# Patient Record
Sex: Male | Born: 1960 | Race: White | Hispanic: No | Marital: Married | Smoking: Former smoker
Health system: Southern US, Community
[De-identification: ages and names within clinical notes are randomized; demographics above are authoritative.]

## PROBLEM LIST (undated history)

## (undated) DIAGNOSIS — N281 Cyst of kidney, acquired: Secondary | ICD-10-CM

## (undated) DIAGNOSIS — I96 Gangrene, not elsewhere classified: Secondary | ICD-10-CM

## (undated) DIAGNOSIS — I6789 Other cerebrovascular disease: Secondary | ICD-10-CM

## (undated) DIAGNOSIS — I700 Atherosclerosis of aorta: Secondary | ICD-10-CM

## (undated) DIAGNOSIS — E785 Hyperlipidemia, unspecified: Secondary | ICD-10-CM

## (undated) DIAGNOSIS — Z86718 Personal history of other venous thrombosis and embolism: Secondary | ICD-10-CM

## (undated) DIAGNOSIS — R911 Solitary pulmonary nodule: Secondary | ICD-10-CM

## (undated) DIAGNOSIS — M270 Developmental disorders of jaws: Secondary | ICD-10-CM

## (undated) DIAGNOSIS — I10 Essential (primary) hypertension: Secondary | ICD-10-CM

## (undated) DIAGNOSIS — I779 Disorder of arteries and arterioles, unspecified: Secondary | ICD-10-CM

## (undated) DIAGNOSIS — Z7901 Long term (current) use of anticoagulants: Secondary | ICD-10-CM

## (undated) DIAGNOSIS — Z7982 Long term (current) use of aspirin: Secondary | ICD-10-CM

## (undated) DIAGNOSIS — M5030 Other cervical disc degeneration, unspecified cervical region: Secondary | ICD-10-CM

## (undated) DIAGNOSIS — Z9889 Other specified postprocedural states: Secondary | ICD-10-CM

## (undated) DIAGNOSIS — I2510 Atherosclerotic heart disease of native coronary artery without angina pectoris: Secondary | ICD-10-CM

## (undated) DIAGNOSIS — F1011 Alcohol abuse, in remission: Secondary | ICD-10-CM

## (undated) DIAGNOSIS — N179 Acute kidney failure, unspecified: Secondary | ICD-10-CM

## (undated) HISTORY — DX: Atherosclerotic heart disease of native coronary artery without angina pectoris: I25.10

## (undated) HISTORY — DX: Acute kidney failure, unspecified: N17.9

## (undated) HISTORY — DX: Solitary pulmonary nodule: R91.1

## (undated) HISTORY — PX: NECK SURGERY: SHX720

## (undated) HISTORY — PX: CARPAL TUNNEL RELEASE: SHX101

---

## 2001-09-15 HISTORY — PX: NECK SURGERY: SHX720

## 2001-09-15 HISTORY — PX: ANTERIOR CERVICAL DECOMP/DISCECTOMY FUSION: SHX1161

## 2016-09-15 DIAGNOSIS — G459 Transient cerebral ischemic attack, unspecified: Secondary | ICD-10-CM

## 2016-09-15 HISTORY — DX: Transient cerebral ischemic attack, unspecified: G45.9

## 2017-02-17 ENCOUNTER — Emergency Department
Admission: EM | Admit: 2017-02-17 | Discharge: 2017-02-17 | Discharge status: Against Medical Advice | Payer: Federal, State, Local not specified - PPO | Attending: Emergency Medicine | Admitting: Emergency Medicine

## 2017-02-17 DIAGNOSIS — I10 Essential (primary) hypertension: Secondary | ICD-10-CM | POA: Insufficient documentation

## 2017-02-17 DIAGNOSIS — R55 Syncope and collapse: Secondary | ICD-10-CM

## 2017-02-17 DIAGNOSIS — F17200 Nicotine dependence, unspecified, uncomplicated: Secondary | ICD-10-CM | POA: Diagnosis not present

## 2017-02-17 DIAGNOSIS — F10239 Alcohol dependence with withdrawal, unspecified: Secondary | ICD-10-CM | POA: Insufficient documentation

## 2017-02-17 DIAGNOSIS — Z79899 Other long term (current) drug therapy: Secondary | ICD-10-CM | POA: Insufficient documentation

## 2017-02-17 DIAGNOSIS — F10939 Alcohol use, unspecified with withdrawal, unspecified: Secondary | ICD-10-CM

## 2017-02-17 HISTORY — DX: Essential (primary) hypertension: I10

## 2017-02-17 LAB — TROPONIN I: Troponin I: <0.03 ng/mL (ref <0.03)

## 2017-02-17 LAB — BASIC METABOLIC PANEL
Anion gap: 8 (ref 5–15)
BUN: 18 mg/dL (ref 6–20)
CALCIUM: 9.1 mg/dL (ref 8.9–10.3)
CHLORIDE: 101 mmol/L (ref 101–111)
CO2: 27 mmol/L (ref 22–32)
Creatinine, Ser: 1.17 mg/dL (ref 0.61–1.24)
GFR calc non Af Amer: >60 mL/min (ref >60)
Glucose, Bld: 110 mg/dL — ABNORMAL HIGH (ref 65–99)
Potassium: 4.5 mmol/L (ref 3.5–5.1)
Sodium: 136 mmol/L (ref 135–145)

## 2017-02-17 LAB — HEPATIC FUNCTION PANEL
ALK PHOS: 56 U/L (ref 38–126)
ALT: 46 U/L (ref 17–63)
AST: 69 U/L — ABNORMAL HIGH (ref 15–41)
Albumin: 4.3 g/dL (ref 3.5–5.0)
Bilirubin, Direct: <0.1 mg/dL — ABNORMAL LOW (ref 0.1–0.5)
TOTAL PROTEIN: 7.6 g/dL (ref 6.5–8.1)
Total Bilirubin: 1 mg/dL (ref 0.3–1.2)

## 2017-02-17 LAB — CBC
HCT: 37.5 % — ABNORMAL LOW (ref 40.0–52.0)
HEMOGLOBIN: 13.1 g/dL (ref 13.0–18.0)
MCH: 33.7 pg (ref 26.0–34.0)
MCHC: 35 g/dL (ref 32.0–36.0)
MCV: 96.2 fL (ref 80.0–100.0)
Platelets: 227 10*3/uL (ref 150–440)
RBC: 3.9 MIL/uL — ABNORMAL LOW (ref 4.40–5.90)
RDW: 15.2 % — ABNORMAL HIGH (ref 11.5–14.5)
WBC: 10.2 10*3/uL (ref 3.8–10.6)

## 2017-02-17 LAB — ETHANOL: Alcohol, Ethyl (B): <5 mg/dL (ref <5)

## 2017-02-17 MED ORDER — VITAMIN B-1 100 MG PO TABS
100.0000 mg | ORAL_TABLET | Freq: Every day | ORAL | Status: DC
  Administered 2017-02-17: 100 mg via ORAL
  Filled 2017-02-17: qty 1

## 2017-02-17 MED ORDER — LORAZEPAM 2 MG/ML IJ SOLN
1.0000 mg | Freq: Once | INTRAMUSCULAR | Status: AC
  Administered 2017-02-17: 1 mg via INTRAVENOUS
  Filled 2017-02-17: qty 1

## 2017-02-17 MED ORDER — THIAMINE HCL 100 MG/ML IJ SOLN
100.0000 mg | Freq: Every day | INTRAMUSCULAR | Status: DC
  Filled 2017-02-17: qty 2

## 2017-02-17 MED ORDER — SODIUM CHLORIDE 0.9 % IV BOLUS (SEPSIS)
1000.0000 mL | Freq: Once | INTRAVENOUS | Status: AC
  Administered 2017-02-17: 1000 mL via INTRAVENOUS

## 2017-02-17 NOTE — ED Notes (Signed)
This RN removed 20 G IV from RIGHT AC. Pt tolerated well. Catheter removed intact, site clean and dry. Dressing applied.

## 2017-02-17 NOTE — ED Provider Notes (Signed)
[name] Emergency Department Provider Note  Time seen: 4:41 PM  I have reviewed the triage vital signs and the nursing notes.   HISTORY  Chief Complaint Near Syncope    HPI Brad Chen is a 56 y.o. male with a past medical history of hypertension who presents to the emergency department after a near syncopal episode. According to the patient around 12:30 today he felt very dizzy/lightheaded and his legs, very weak and wobbly. Patient states he fell he keeps going to pass out but did not. He continued to be very weak and unable to walk due to weakness/wobbliness in his legs. Patient came to the emergency department for evaluation. Denies any chest pain at any point. Patient states this happened approximately 3 weeks ago as well. Patient continues to be extremely ataxic/wobbly, with tremor in the emergency department.  Past Medical History:  Diagnosis Date  . Hypertension     There are no active problems to display for this patient.   History reviewed. No pertinent surgical history.  Prior to Admission medications   Not on File    No Known Allergies  No family history on file.  Social History Social History  Substance Use Topics  . Smoking status: Current Every Day Smoker  . Smokeless tobacco: Not on file  . Alcohol use Yes    Review of Systems Constitutional: Negative for fever. Cardiovascular: Negative for chest pain. Respiratory: Negative for shortness of breath. Gastrointestinal: Negative for abdominal pain Genitourinary: Negative for dysuria. Musculoskeletal: Negative for back pain. Skin: Negative for rash. Neurological: Negative for headache. Weakness of both of his legs. All other ROS negative  ____________________________________________   PHYSICAL EXAM:  VITAL SIGNS: ED Triage Vitals [02/17/17 1418]  Enc Vitals Group     BP (!) 175/73     Pulse Rate 82     Resp 20     Temp 98.2 F (36.8 C)     Temp Source Oral     SpO2 100 %     Weight 154 lb (69.9 kg)     Height 5\' 9"  (1.753 m)     Head Circumference      Peak Flow      Pain Score      Pain Loc      Pain Edu?      Excl. in GC?     Constitutional: Alert and oriented. Patient is very tremulous. Eyes: Normal exam ENT   Head: Normocephalic and atraumatic.   Mouth/Throat: Mucous membranes are moist. Cardiovascular: Normal rate, regular rhythm. No murmur Respiratory: Normal respiratory effort without tachypnea nor retractions. Breath sounds are clear  Gastrointestinal: Soft and nontender. No distention.   Musculoskeletal: Nontender with normal range of motion in all extremities. Neurologic:  Normal speech and language. Very tremulous, positive for asterixis, no pronator drift. Patient has 5/5 motor in all extremities but with ataxic movements. Sensation is intact and equal. No cranial nerve deficits. Normal patellar reflexes. Skin:  Skin is warm, dry and intact.  Psychiatric: Mood and affect are normal.  ____________________________________________    EKG  EKG reviewed and interpreted by myself shows normal sinus rhythm at 72 bpm, narrow QRS, normal axis, normal intervals, no concerning ST changes.  ____________________________________________   INITIAL IMPRESSION / ASSESSMENT AND PLAN / ED COURSE  Pertinent labs & imaging results that were available during my care of the patient were reviewed by me and considered in my medical decision making (see chart for details).  The patient presents  to the emergency department for a near syncopal event at work. Patient states he is very wobbly is having trouble walking. Even in the emergency department cannot walk from the wheelchair to the bed without holding onto something. Patient has significant tremor in all extremities, asterixis on exam. Patient admits to daily drinking, but states he drank last night like normal denies any drinking during the daytime. Since approximately 6 beers per  day. Patient's examination is very consistent with withdrawal. We will check labs, IV hydrate, treat with IV Ativan and thiamine. We will reassess after Ativan administration. We will check a CIWA score.    Patient remains extremely tremulousness. I definitely believe there is a significant alcohol withdrawal issue. I have highly recommended the patient be admitted to the hospital for further workup and detox. I stated to the patient if it was not withdrawal related there were still need to be a neurological workup including neurology consultation and possible MRI. Patient understands the risks but still wishes to leave the hospital. Patient is having to hold onto the wheelchair to ambulate, I discussed with the patient and wife at length now on multiple occasions my high recommendation to stay in the emergency department. The patient has capacity to make some decisions, understands risks of going home but wishes to be discharged anyways. We will discharge the patient home AGAINST MEDICAL ADVICE.  ____________________________________________   FINAL CLINICAL IMPRESSION(S) / ED DIAGNOSES  near syncope Alcohol withdrawal   Minna Antis, MD 02/17/17 [phone]

## 2017-02-17 NOTE — ED Triage Notes (Signed)
Pt arrives to ER via [facility] from work after near syncope at work. VSS with [facility]. CBG 208. Pt did not pass out. Denies SOB or CP. Pt alert and oriented X4, active, cooperative, pt in NAD. RR even and unlabored, color WNL.  500cc NS bolus by EMS, 20 G to L forearm by EMS.

## 2017-02-23 ENCOUNTER — Other Ambulatory Visit: Payer: Self-pay | Admitting: Internal Medicine

## 2017-02-23 DIAGNOSIS — R29898 Other symptoms and signs involving the musculoskeletal system: Secondary | ICD-10-CM

## 2017-02-23 DIAGNOSIS — R270 Ataxia, unspecified: Secondary | ICD-10-CM

## 2017-02-24 ENCOUNTER — Ambulatory Visit
Admission: RE | Admit: 2017-02-24 | Discharge: 2017-02-24 | Disposition: A | Discharge status: Home / Self Care | Payer: Federal, State, Local not specified - PPO | Source: Ambulatory Visit | Attending: Internal Medicine | Admitting: Internal Medicine

## 2017-02-24 DIAGNOSIS — R9082 White matter disease, unspecified: Secondary | ICD-10-CM | POA: Diagnosis not present

## 2017-02-24 DIAGNOSIS — R270 Ataxia, unspecified: Secondary | ICD-10-CM | POA: Insufficient documentation

## 2017-02-24 DIAGNOSIS — G319 Degenerative disease of nervous system, unspecified: Secondary | ICD-10-CM | POA: Diagnosis not present

## 2017-02-24 DIAGNOSIS — R29898 Other symptoms and signs involving the musculoskeletal system: Secondary | ICD-10-CM

## 2017-02-24 DIAGNOSIS — R531 Weakness: Secondary | ICD-10-CM | POA: Diagnosis not present

## 2017-02-24 DIAGNOSIS — R600 Localized edema: Secondary | ICD-10-CM | POA: Diagnosis not present

## 2018-05-05 ENCOUNTER — Emergency Department
Admission: EM | Admit: 2018-05-05 | Discharge: 2018-05-05 | Disposition: A | Discharge status: Home / Self Care | Payer: Federal, State, Local not specified - PPO | Attending: Emergency Medicine | Admitting: Emergency Medicine

## 2018-05-05 ENCOUNTER — Emergency Department: Payer: Federal, State, Local not specified - PPO

## 2018-05-05 ENCOUNTER — Other Ambulatory Visit: Payer: Self-pay

## 2018-05-05 DIAGNOSIS — I10 Essential (primary) hypertension: Secondary | ICD-10-CM | POA: Diagnosis not present

## 2018-05-05 DIAGNOSIS — F10920 Alcohol use, unspecified with intoxication, uncomplicated: Secondary | ICD-10-CM

## 2018-05-05 DIAGNOSIS — F10129 Alcohol abuse with intoxication, unspecified: Secondary | ICD-10-CM | POA: Insufficient documentation

## 2018-05-05 DIAGNOSIS — W19XXXA Unspecified fall, initial encounter: Secondary | ICD-10-CM | POA: Diagnosis not present

## 2018-05-05 DIAGNOSIS — R4182 Altered mental status, unspecified: Secondary | ICD-10-CM | POA: Diagnosis present

## 2018-05-05 DIAGNOSIS — F17210 Nicotine dependence, cigarettes, uncomplicated: Secondary | ICD-10-CM | POA: Insufficient documentation

## 2018-05-05 LAB — CBC WITH DIFFERENTIAL/PLATELET
BASOS PCT: 1 %
Basophils Absolute: 0.1 10*3/uL (ref 0–0.1)
EOS ABS: 0 10*3/uL (ref 0–0.7)
EOS PCT: 1 %
HCT: 33.1 % — ABNORMAL LOW (ref 40.0–52.0)
Hemoglobin: 11.6 g/dL — ABNORMAL LOW (ref 13.0–18.0)
Lymphocytes Relative: 22 %
Lymphs Abs: 2.1 10*3/uL (ref 1.0–3.6)
MCH: 34.9 pg — ABNORMAL HIGH (ref 26.0–34.0)
MCHC: 34.9 g/dL (ref 32.0–36.0)
MCV: 99.9 fL (ref 80.0–100.0)
Monocytes Absolute: 0.8 10*3/uL (ref 0.2–1.0)
Monocytes Relative: 8 %
NEUTROS ABS: 6.4 10*3/uL (ref 1.4–6.5)
Neutrophils Relative %: 68 %
PLATELETS: 323 10*3/uL (ref 150–440)
RBC: 3.31 MIL/uL — AB (ref 4.40–5.90)
RDW: 13.4 % (ref 11.5–14.5)
WBC: 9.3 10*3/uL (ref 3.8–10.6)

## 2018-05-05 LAB — ETHANOL: ALCOHOL ETHYL (B): 315 mg/dL — AB (ref <10)

## 2018-05-05 LAB — URINALYSIS, COMPLETE (UACMP) WITH MICROSCOPIC
Bacteria, UA: NONE SEEN
Bilirubin Urine: NEGATIVE
GLUCOSE, UA: 50 mg/dL — AB
Ketones, ur: NEGATIVE mg/dL
Leukocytes, UA: NEGATIVE
NITRITE: NEGATIVE
Protein, ur: NEGATIVE mg/dL
SPECIFIC GRAVITY, URINE: 1.005 (ref 1.005–1.030)
pH: 5 (ref 5.0–8.0)

## 2018-05-05 LAB — COMPREHENSIVE METABOLIC PANEL
ALBUMIN: 3.9 g/dL (ref 3.5–5.0)
ALT: 44 U/L (ref 0–44)
ANION GAP: 10 (ref 5–15)
AST: 64 U/L — ABNORMAL HIGH (ref 15–41)
Alkaline Phosphatase: 56 U/L (ref 38–126)
BUN: 12 mg/dL (ref 6–20)
CHLORIDE: 102 mmol/L (ref 98–111)
CO2: 24 mmol/L (ref 22–32)
Calcium: 8.5 mg/dL — ABNORMAL LOW (ref 8.9–10.3)
Creatinine, Ser: 1.07 mg/dL (ref 0.61–1.24)
GFR calc Af Amer: >60 mL/min (ref >60)
GFR calc non Af Amer: >60 mL/min (ref >60)
GLUCOSE: 95 mg/dL (ref 70–99)
Potassium: 4.6 mmol/L (ref 3.5–5.1)
SODIUM: 136 mmol/L (ref 135–145)
Total Bilirubin: 0.5 mg/dL (ref 0.3–1.2)
Total Protein: 6.9 g/dL (ref 6.5–8.1)

## 2018-05-05 LAB — URINE DRUG SCREEN, QUALITATIVE (ARMC ONLY)
AMPHETAMINES, UR SCREEN: NOT DETECTED
Barbiturates, Ur Screen: NOT DETECTED
Cannabinoid 50 Ng, Ur ~~LOC~~: NOT DETECTED
Cocaine Metabolite,Ur ~~LOC~~: NOT DETECTED
MDMA (ECSTASY) UR SCREEN: NOT DETECTED
Methadone Scn, Ur: NOT DETECTED
Opiate, Ur Screen: NOT DETECTED
PHENCYCLIDINE (PCP) UR S: NOT DETECTED
Tricyclic, Ur Screen: NOT DETECTED

## 2018-05-05 LAB — TROPONIN I: Troponin I: <0.03 ng/mL (ref <0.03)

## 2018-05-05 NOTE — ED Notes (Addendum)
Pt was discharged to lobby in order to call cab in order to get home. First nurse notified. Pt was able to walk to lobby with steady gait and no distress noted. Pt was adamant about leaving and getting discharged and therefore this nurse was unable to obtain vitals prior to patient leaving.

## 2018-05-05 NOTE — ED Notes (Signed)
Nile RiggsStephanie Mitschke - spouse - [phone] - call when released

## 2018-05-05 NOTE — ED Notes (Signed)
Dr Mayford KnifeWilliams notified of critical ETOH level of 315 - no new orders at this time

## 2018-05-05 NOTE — ED Notes (Signed)
[employer] patrol at bed side.  

## 2018-05-05 NOTE — ED Notes (Addendum)
Pt states he is leaving now and that he needs to call his wife - the wife has called this nurse and states that the pt cannot come home - advised wife that we could not hold pt against his will - wife requested to talk to pt - pt given phone to contact wife - pt is up for discharge

## 2018-05-05 NOTE — ED Notes (Addendum)
[employer] blood draw (alcohol) for [employer]

## 2018-05-05 NOTE — ED Notes (Signed)
Patient transported to CT 

## 2018-05-05 NOTE — ED Notes (Signed)
Pt agitated and standing at nurses desk - he is demanding for someone to call his wife and let him go home - pt has pulled IV out of hand and it is hanging by the tape - pt states he is not staying in the bed and he is not staying here one more second - Dr Mayford KnifeWilliams notified and is talking to pt

## 2018-05-05 NOTE — ED Notes (Signed)
Pt wife states no one is picking pt up. Pt told that he can call a cab from the lobby when he is discharged. Pt stated his wallet is at home. This tech told pt he could let them know that. Pt informed that cab phone number and phone is in the lobby. Pt is waiting on DC papers at this time.
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
## 2018-05-05 NOTE — ED Notes (Signed)
Pt resting quietly with eyes closed respirations even and unlabored
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
## 2018-05-05 NOTE — ED Notes (Signed)
Pt refuses to be connected to monitoring equipment and becomes more agitated the more conversation is initiated about it

## 2018-07-21 DIAGNOSIS — Z86718 Personal history of other venous thrombosis and embolism: Secondary | ICD-10-CM | POA: Insufficient documentation

## 2020-02-19 ENCOUNTER — Other Ambulatory Visit: Payer: Self-pay

## 2020-02-19 ENCOUNTER — Observation Stay
Admission: EM | Admit: 2020-02-19 | Discharge: 2020-02-20 | Disposition: A | Discharge status: Home / Self Care | Payer: Federal, State, Local not specified - PPO | Attending: Internal Medicine | Admitting: Internal Medicine

## 2020-02-19 ENCOUNTER — Emergency Department: Payer: Federal, State, Local not specified - PPO

## 2020-02-19 ENCOUNTER — Encounter: Payer: Self-pay | Admitting: Internal Medicine

## 2020-02-19 ENCOUNTER — Observation Stay: Payer: Federal, State, Local not specified - PPO

## 2020-02-19 DIAGNOSIS — I10 Essential (primary) hypertension: Secondary | ICD-10-CM | POA: Insufficient documentation

## 2020-02-19 DIAGNOSIS — Z720 Tobacco use: Secondary | ICD-10-CM | POA: Diagnosis present

## 2020-02-19 DIAGNOSIS — N179 Acute kidney failure, unspecified: Secondary | ICD-10-CM | POA: Diagnosis not present

## 2020-02-19 DIAGNOSIS — E860 Dehydration: Secondary | ICD-10-CM | POA: Diagnosis not present

## 2020-02-19 DIAGNOSIS — R7989 Other specified abnormal findings of blood chemistry: Secondary | ICD-10-CM | POA: Diagnosis present

## 2020-02-19 DIAGNOSIS — R55 Syncope and collapse: Secondary | ICD-10-CM | POA: Diagnosis not present

## 2020-02-19 DIAGNOSIS — F17200 Nicotine dependence, unspecified, uncomplicated: Secondary | ICD-10-CM | POA: Diagnosis not present

## 2020-02-19 DIAGNOSIS — Z20822 Contact with and (suspected) exposure to covid-19: Secondary | ICD-10-CM | POA: Diagnosis not present

## 2020-02-19 DIAGNOSIS — R4189 Other symptoms and signs involving cognitive functions and awareness: Secondary | ICD-10-CM | POA: Diagnosis not present

## 2020-02-19 DIAGNOSIS — F1010 Alcohol abuse, uncomplicated: Secondary | ICD-10-CM | POA: Diagnosis not present

## 2020-02-19 DIAGNOSIS — Z79899 Other long term (current) drug therapy: Secondary | ICD-10-CM | POA: Insufficient documentation

## 2020-02-19 LAB — BASIC METABOLIC PANEL
Anion gap: 11 (ref 5–15)
BUN: 13 mg/dL (ref 6–20)
CO2: 22 mmol/L (ref 22–32)
Calcium: 7.4 mg/dL — ABNORMAL LOW (ref 8.9–10.3)
Chloride: 104 mmol/L (ref 98–111)
Creatinine, Ser: 1.39 mg/dL — ABNORMAL HIGH (ref 0.61–1.24)
GFR calc Af Amer: >60 mL/min (ref >60)
GFR calc non Af Amer: 55 mL/min — ABNORMAL LOW (ref >60)
Glucose, Bld: 91 mg/dL (ref 70–99)
Potassium: 4 mmol/L (ref 3.5–5.1)
Sodium: 137 mmol/L (ref 135–145)

## 2020-02-19 LAB — HEPATIC FUNCTION PANEL
ALT: 20 U/L (ref 0–44)
AST: 28 U/L (ref 15–41)
Albumin: 3.3 g/dL — ABNORMAL LOW (ref 3.5–5.0)
Alkaline Phosphatase: 39 U/L (ref 38–126)
Bilirubin, Direct: <0.1 mg/dL (ref 0.0–0.2)
Total Bilirubin: 0.7 mg/dL (ref 0.3–1.2)
Total Protein: 6 g/dL — ABNORMAL LOW (ref 6.5–8.1)

## 2020-02-19 LAB — URINALYSIS, COMPLETE (UACMP) WITH MICROSCOPIC
Bacteria, UA: NONE SEEN
Bilirubin Urine: NEGATIVE
Glucose, UA: 50 mg/dL — AB
Hgb urine dipstick: NEGATIVE
Ketones, ur: 5 mg/dL — AB
Leukocytes,Ua: NEGATIVE
Nitrite: NEGATIVE
Protein, ur: NEGATIVE mg/dL
Specific Gravity, Urine: 1.006 (ref 1.005–1.030)
pH: 6 (ref 5.0–8.0)

## 2020-02-19 LAB — URINE DRUG SCREEN, QUALITATIVE (ARMC ONLY)
Amphetamines, Ur Screen: NOT DETECTED
Barbiturates, Ur Screen: NOT DETECTED
Benzodiazepine, Ur Scrn: NOT DETECTED
Cannabinoid 50 Ng, Ur ~~LOC~~: NOT DETECTED
Cocaine Metabolite,Ur ~~LOC~~: NOT DETECTED
MDMA (Ecstasy)Ur Screen: NOT DETECTED
Methadone Scn, Ur: NOT DETECTED
Opiate, Ur Screen: NOT DETECTED
Phencyclidine (PCP) Ur S: NOT DETECTED
Tricyclic, Ur Screen: NOT DETECTED

## 2020-02-19 LAB — CBC WITH DIFFERENTIAL/PLATELET
Abs Immature Granulocytes: 0.05 10*3/uL (ref 0.00–0.07)
Basophils Absolute: 0.1 10*3/uL (ref 0.0–0.1)
Basophils Relative: 1 %
Eosinophils Absolute: 0 10*3/uL (ref 0.0–0.5)
Eosinophils Relative: 0 %
HCT: 32.3 % — ABNORMAL LOW (ref 39.0–52.0)
Hemoglobin: 11.3 g/dL — ABNORMAL LOW (ref 13.0–17.0)
Immature Granulocytes: 1 %
Lymphocytes Relative: 19 %
Lymphs Abs: 1.3 10*3/uL (ref 0.7–4.0)
MCH: 34 pg (ref 26.0–34.0)
MCHC: 35 g/dL (ref 30.0–36.0)
MCV: 97.3 fL (ref 80.0–100.0)
Monocytes Absolute: 0.7 10*3/uL (ref 0.1–1.0)
Monocytes Relative: 10 %
Neutro Abs: 4.6 10*3/uL (ref 1.7–7.7)
Neutrophils Relative %: 69 %
Platelets: 313 10*3/uL (ref 150–400)
RBC: 3.32 MIL/uL — ABNORMAL LOW (ref 4.22–5.81)
RDW: 13.2 % (ref 11.5–15.5)
WBC: 6.7 10*3/uL (ref 4.0–10.5)
nRBC: 0 % (ref 0.0–0.2)

## 2020-02-19 LAB — SARS CORONAVIRUS 2 BY RT PCR (HOSPITAL ORDER, PERFORMED IN ~~LOC~~ HOSPITAL LAB): SARS Coronavirus 2: NEGATIVE

## 2020-02-19 LAB — PROTIME-INR
INR: 0.9 (ref 0.8–1.2)
Prothrombin Time: 11.6 seconds (ref 11.4–15.2)

## 2020-02-19 LAB — MAGNESIUM: Magnesium: 1.8 mg/dL (ref 1.7–2.4)

## 2020-02-19 LAB — LACTIC ACID, PLASMA
Lactic Acid, Venous: 2.9 mmol/L (ref 0.5–1.9)
Lactic Acid, Venous: 2.9 mmol/L (ref 0.5–1.9)

## 2020-02-19 LAB — ETHANOL: Alcohol, Ethyl (B): 140 mg/dL — ABNORMAL HIGH (ref <10)

## 2020-02-19 LAB — TROPONIN I (HIGH SENSITIVITY)
Troponin I (High Sensitivity): 8 ng/L (ref <18)
Troponin I (High Sensitivity): 8 ng/L (ref <18)

## 2020-02-19 LAB — PHOSPHORUS: Phosphorus: 2.6 mg/dL (ref 2.5–4.6)

## 2020-02-19 MED ORDER — LORAZEPAM 2 MG/ML IJ SOLN: 0.0000 mg | Freq: Four times a day (QID) | INTRAMUSCULAR | Status: DC

## 2020-02-19 MED ORDER — HYDRALAZINE HCL 20 MG/ML IJ SOLN
5.0000 mg | INTRAMUSCULAR | Status: DC | PRN
  Filled 2020-02-19: qty 0.25

## 2020-02-19 MED ORDER — CALCIUM GLUCONATE-NACL 2-0.675 GM/100ML-% IV SOLN
2.0000 g | Freq: Once | INTRAVENOUS | Status: AC
  Administered 2020-02-19: 2000 mg via INTRAVENOUS
  Filled 2020-02-19: qty 100

## 2020-02-19 MED ORDER — SODIUM CHLORIDE 0.9 % IV BOLUS
1000.0000 mL | Freq: Once | INTRAVENOUS | Status: AC
  Administered 2020-02-19: 1000 mL via INTRAVENOUS

## 2020-02-19 MED ORDER — SODIUM CHLORIDE 0.9 % IV SOLN: INTRAVENOUS | Status: DC

## 2020-02-19 MED ORDER — LORAZEPAM 2 MG PO TABS: 0.0000 mg | ORAL_TABLET | Freq: Two times a day (BID) | ORAL | Status: DC

## 2020-02-19 MED ORDER — ENOXAPARIN SODIUM 40 MG/0.4ML ~~LOC~~ SOLN
40.0000 mg | SUBCUTANEOUS | Status: DC
  Administered 2020-02-19: 40 mg via SUBCUTANEOUS
  Filled 2020-02-19: qty 0.4

## 2020-02-19 MED ORDER — THIAMINE HCL 100 MG/ML IJ SOLN: 100.0000 mg | Freq: Every day | INTRAMUSCULAR | Status: DC

## 2020-02-19 MED ORDER — ENOXAPARIN SODIUM 40 MG/0.4ML ~~LOC~~ SOLN: 40.0000 mg | SUBCUTANEOUS | Status: DC

## 2020-02-19 MED ORDER — NICOTINE 21 MG/24HR TD PT24
21.0000 mg | MEDICATED_PATCH | Freq: Every day | TRANSDERMAL | Status: DC
  Administered 2020-02-19 – 2020-02-20 (×2): 21 mg via TRANSDERMAL
  Filled 2020-02-19 (×2): qty 1

## 2020-02-19 MED ORDER — LORAZEPAM 2 MG/ML IJ SOLN: 0.0000 mg | Freq: Two times a day (BID) | INTRAMUSCULAR | Status: DC

## 2020-02-19 MED ORDER — THIAMINE HCL 100 MG PO TABS
100.0000 mg | ORAL_TABLET | Freq: Every day | ORAL | Status: DC
  Administered 2020-02-19 – 2020-02-20 (×2): 100 mg via ORAL
  Filled 2020-02-19 (×2): qty 1

## 2020-02-19 MED ORDER — LORAZEPAM 2 MG PO TABS: 0.0000 mg | ORAL_TABLET | Freq: Four times a day (QID) | ORAL | Status: DC

## 2020-02-19 MED ORDER — ONDANSETRON HCL 4 MG/2ML IJ SOLN: 4.0000 mg | Freq: Three times a day (TID) | INTRAMUSCULAR | Status: DC | PRN

## 2020-02-19 MED ORDER — SODIUM CHLORIDE 0.9% FLUSH
3.0000 mL | Freq: Two times a day (BID) | INTRAVENOUS | Status: DC
  Administered 2020-02-19 – 2020-02-20 (×2): 3 mL via INTRAVENOUS

## 2020-02-19 MED ORDER — LORAZEPAM 2 MG/ML IJ SOLN: 2.0000 mg | INTRAMUSCULAR | Status: DC | PRN

## 2020-02-19 MED ORDER — ACETAMINOPHEN 325 MG PO TABS: 650.0000 mg | ORAL_TABLET | Freq: Four times a day (QID) | ORAL | Status: DC | PRN
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
## 2020-02-19 NOTE — H&P (Signed)
History and Physical    Brad Chen QDI:[id number] DOB: 14-May-1961 DOA: 02/19/2020  Referring MD/NP/PA:   PCP: Lauro Regulus, MD   Patient coming from:  The patient is coming from home.  At baseline, pt is independent for most of ADL.        Chief Complaint: Unresponsiveness  HPI: Brad Chen is a 59 y.o. male with medical history significant of tobacco abuse, alcohol abuse, hypertension, who presents with unresponsiveness.  Per his wife, patient suddenly became unresponsive when he was in the car as a passenger.  He probably was not responsive for about 1 to 2 minutes.  No seizure activity noted.  Patient had incontinence of urine and bowel movement during the episode.  When patient woke up, he is mildly confused, but oriented x3.  He moves all extremities normally.  No facial droop or slurred speech.  No vision change or hearing loss.  Patient denies any chest pain, shortness breath, cough.  No fever or chills.  Denies nausea, vomiting, diarrhea, abdominal pain, symptoms of UTI.  ED Course: pt was found to have WBC 6.7, lactic acid 2.9, INR 0.9, pending COVID-19 PCR, alcohol level 140, AKI with creatinine 1.39, BUN 13, blood pressure 132/61, heart rate 83, RR 18, oxygen saturation 97% on room air.  CT head is negative for acute intracranial abnormalities.  Calcium level 7.4 which is corrected to 7.96.  Patient is placed on MedSurg bed for observation  Review of Systems:   General: no fevers, chills, no body weight gain, has fatigue HEENT: no blurry vision, hearing changes or sore throat Respiratory: no dyspnea, coughing, wheezing CV: no chest pain, no palpitations GI: no nausea, vomiting, abdominal pain, diarrhea, constipation GU: no dysuria, burning on urination, increased urinary frequency, hematuria  Ext: no leg edema Neuro: no unilateral weakness, numbness, or tingling, no vision change or hearing loss. Had unresponsiveness Skin: no skin tear. MSK: No muscle spasm,  no deformity, no limitation of range of movement in spin Heme: No easy bruising.  Travel history: No recent long distant travel.  Allergy: No Known Allergies  Past Medical History:  Diagnosis Date  . Hypertension     Past Surgical History:  Procedure Laterality Date  . CARPAL TUNNEL RELEASE    . NECK SURGERY      Social History:  reports that he has been smoking. He has never used smokeless tobacco. He reports current alcohol use. He reports that he does not use drugs.  Family History:  Family History  Problem Relation Age of Onset  . Hypertension Mother      Prior to Admission medications   Medication Sig Start Date End Date Taking? Authorizing Provider  lisinopril (PRINIVIL,ZESTRIL) 10 MG tablet Take 10 mg by mouth daily. 02/25/18   [provider]    Physical Exam: Vitals:   02/19/20 1319 02/19/20 1320 02/19/20 1524  BP: 132/61  128/69  Pulse: 83    Resp: 15    SpO2: 97%    Weight:  75.8 kg    General: Not in acute distress HEENT:       Eyes: PERRL, EOMI, no scleral icterus.       ENT: No discharge from the ears and nose, no pharynx injection, no tonsillar enlargement.        Neck: No JVD, no bruit, no mass felt. Heme: No neck lymph node enlargement. Cardiac: S1/S2, RRR, No murmurs, No gallops or rubs. Respiratory: No rales, wheezing, rhonchi or rubs. GI: Soft, nondistended, nontender,  no rebound pain, no organomegaly, BS present. GU: No hematuria Ext: No pitting leg edema bilaterally. 2+DP/PT pulse bilaterally. Musculoskeletal: No joint deformities, No joint redness or warmth, no limitation of ROM in spin. Skin: No skin tear Neuro: currently pt is alert, oriented X3, cranial nerves II-XII grossly intact, moves all extremities normally. Muscle strength 5/5 in all extremities, sensation to light touch intact.  Psych: Patient is not psychotic, no suicidal or hemocidal ideation.  Labs on Admission: I have personally reviewed following labs and imaging  studies  CBC: Recent Labs  Lab 02/19/20 1329  WBC 6.7  NEUTROABS 4.6  HGB 11.3*  HCT 32.3*  MCV 97.3  PLT 313   Basic Metabolic Panel: Recent Labs  Lab 02/19/20 1329  NA 137  K 4.0  CL 104  CO2 22  GLUCOSE 91  BUN 13  CREATININE 1.39*  CALCIUM 7.4*   GFR: CrCl cannot be calculated (Unknown ideal weight.). Liver Function Tests: Recent Labs  Lab 02/19/20 1329  AST 28  ALT 20  ALKPHOS 39  BILITOT 0.7  PROT 6.0*  ALBUMIN 3.3*   No results for input(s): LIPASE, AMYLASE in the last 168 hours. No results for input(s): AMMONIA in the last 168 hours. Coagulation Profile: Recent Labs  Lab 02/19/20 1329  INR 0.9   Cardiac Enzymes: No results for input(s): CKTOTAL, CKMB, CKMBINDEX, TROPONINI in the last 168 hours. BNP (last 3 results) No results for input(s): PROBNP in the last 8760 hours. HbA1C: No results for input(s): HGBA1C in the last 72 hours. CBG: No results for input(s): GLUCAP in the last 168 hours. Lipid Profile: No results for input(s): CHOL, HDL, LDLCALC, TRIG, CHOLHDL, LDLDIRECT in the last 72 hours. Thyroid Function Tests: No results for input(s): TSH, T4TOTAL, FREET4, T3FREE, THYROIDAB in the last 72 hours. Anemia Panel: No results for input(s): VITAMINB12, FOLATE, FERRITIN, TIBC, IRON, RETICCTPCT in the last 72 hours. Urine analysis:    Component Value Date/Time   COLORURINE YELLOW (A) 02/19/2020 1510   APPEARANCEUR CLEAR (A) 02/19/2020 1510   LABSPEC 1.006 02/19/2020 1510   PHURINE 6.0 02/19/2020 1510   GLUCOSEU 50 (A) 02/19/2020 1510   HGBUR NEGATIVE 02/19/2020 1510   BILIRUBINUR NEGATIVE 02/19/2020 1510   KETONESUR 5 (A) 02/19/2020 1510   PROTEINUR NEGATIVE 02/19/2020 1510   NITRITE NEGATIVE 02/19/2020 1510   LEUKOCYTESUR NEGATIVE 02/19/2020 1510   Sepsis Labs: @LABRCNTIP (procalcitonin:4,lacticidven:4) ) Recent Results (from the past 240 hour(s))  SARS Coronavirus 2 by RT PCR (hospital order, performed in [facility] hospital lab)  Nasopharyngeal Nasopharyngeal Swab     Status: None   Collection Time: 02/19/20  3:21 PM   Specimen: Nasopharyngeal Swab  Result Value Ref Range Status   SARS Coronavirus 2 NEGATIVE NEGATIVE Final    Comment: (NOTE) SARS-CoV-2 target nucleic acids are NOT DETECTED. The SARS-CoV-2 RNA is generally detectable in upper and lower respiratory specimens during the acute phase of infection. The lowest concentration of SARS-CoV-2 viral copies this assay can detect is 250 copies / mL. A negative result does not preclude SARS-CoV-2 infection and should not be used as the sole basis for treatment or other patient management decisions.  A negative result may occur with improper specimen collection / handling, submission of specimen other than nasopharyngeal swab, presence of viral mutation(s) within the areas targeted by this assay, and inadequate number of viral copies (<250 copies / mL). A negative result must be combined with clinical observations, patient history, and epidemiological information. Fact Sheet for Patients:   04/20/20  Fact Sheet for Healthcare Providers: BankingDealers.co.za This test is not yet approved or cleared  by the [place] FDA and has been authorized for detection and/or diagnosis of SARS-CoV-2 by FDA under an Emergency Use Authorization (EUA).  This EUA will remain in effect (meaning this test can be used) for the duration of the COVID-19 declaration under Section 564(b)(1) of the Act, 21 U.S.C. section 360bbb-3(b)(1), unless the authorization is terminated or revoked sooner. Performed at [facility], [address] [place], [place] [address]      Radiological Exams on Admission: CT Head Wo Contrast  Result Date: 02/19/2020 CLINICAL DATA:  Seizure, unresponsive, hypotensive EXAM: CT HEAD WITHOUT CONTRAST TECHNIQUE: Contiguous axial images were obtained from the base of the skull through the  vertex without intravenous contrast. COMPARISON:  05/05/2018 and previous FINDINGS: Brain: Mild parenchymal atrophy. Patchy mild areas of hypoattenuation in deep and periventricular white matter bilaterally. Negative for acute intracranial hemorrhage, mass lesion, acute infarction, midline shift, or mass-effect. Acute infarct may be inapparent on noncontrast CT. Ventricles and sulci symmetric. Vascular: Atherosclerotic and physiologic intracranial calcifications. Skull: Normal. Negative for fracture or focal lesion. Sinuses/Orbits: No acute finding. Other: None IMPRESSION: 1. Negative for bleed or other acute intracranial process. 2. Mild atrophy and nonspecific white matter changes. Electronically Signed   By: Lucrezia Europe M.D.   On: 02/19/2020 14:02     EKG: Independently reviewed.  Sinus rhythm, QTC 469, LAE, nonspecific T wave changes  Assessment/Plan Principal Problem:   Unresponsive Active Problems:   Hypertension   Elevated lactic acid level   AKI (acute kidney injury) ([place])   Alcohol abuse   Hypocalcemia   Tobacco abuse   Unresponsive: Etiology is not clear. The differential diagnosis is broad, including alcohol intoxication, seizure, TIA, arrhythmia, drug abuse, orthostatic status.  - Place on med-surg for obs - Orthostatic vital signs  -  MRI-brain - EEG - 2d echo - Neuro checks  - IVF: 2L of NS 75 cc/h  HTN:  -hold lisinopril due to AKI -hydralazine prn  Elevated lactic acid level: Lactic acid 2.9, likely due to alcohol abuse and starvation.  No signs of infection -IV fluid as above  AKI (acute kidney injury) ([place]): Likely due to dehydration and continuation of lisinopril -Hold lisinopril -IV fluid as above -Follow-up renal function by BMP  Hypocalcemia: -Give 2 g of calcium gluconate -Check magnesium and phosph level  Tobacco abuse and Alcohol abuse: -Did counseling about importance of quitting smoking -Nicotine patch -Did counseling about the importance of  quitting drinking -CIWA protocol      DVT ppx: SQ Lovenox Code Status: Full code Family Communication: Yes, patient's wife at bed side Disposition Plan:  Anticipate discharge back to previous environment Consults called:  None Admission status: Med-surg bed for obs     Status is: Observation  The patient remains OBS appropriate and will d/c before 2 midnights.  Dispo: The patient is from: Home              Anticipated d/c is to: Home              Anticipated d/c date is: 1 day              Patient currently is not medically stable to d/c.         Date of Service 02/19/2020    Ivor Costa [facility]   If 7PM-7AM, please contact night-coverage [email or web address] 02/19/2020, 6:14 PM

## 2020-02-19 NOTE — ED Notes (Signed)
Called to give report, RN to look over chart and call if questions arise. Will transport to floor at apx 2000.

## 2020-02-19 NOTE — ED Notes (Signed)
Spoke to [employer], alerted will transport to floor at 2000 and to call if questions arise. RN agreeable.

## 2020-02-19 NOTE — Progress Notes (Signed)
   02/19/20 2104  Vitals  Temp 98 F (36.7 C)  BP (!) 157/88  MAP (mmHg) 107  BP Location Right Arm  BP Method Automatic  Patient Position (if appropriate) Standing  Pulse Rate 85  Resp 19  Oxygen Therapy  SpO2 97 %  O2 Device Room Air  MEWS Score  MEWS Temp 0  MEWS Systolic 0  MEWS Pulse 0  MEWS RR 0  MEWS LOC 0  MEWS Score 0  MEWS Score Color Green  The patient is admitted to 1 A 36 with the diagnosis of unresponsiveness.  A & O x 4. Denied any acute pain. The patient is oriented to his room,ascom/call bell and staff. Patient voiced no concern. Will continue to monitor.

## 2020-02-19 NOTE — ED Triage Notes (Signed)
Pt presents via EMS c/o unresponsiveness and hypotension. EMS reports pt drinks ETOH daily however pt states drink ETOH q3-4 days. Pt A&Ox4 at this time.

## 2020-02-20 ENCOUNTER — Observation Stay
Admit: 2020-02-20 | Discharge: 2020-02-20 | Disposition: A | Discharge status: Home / Self Care | Payer: Federal, State, Local not specified - PPO | Attending: Internal Medicine | Admitting: Internal Medicine

## 2020-02-20 DIAGNOSIS — R4189 Other symptoms and signs involving cognitive functions and awareness: Secondary | ICD-10-CM

## 2020-02-20 LAB — PROTIME-INR
INR: 0.9 (ref 0.8–1.2)
Prothrombin Time: 11.8 seconds (ref 11.4–15.2)

## 2020-02-20 LAB — BASIC METABOLIC PANEL
Anion gap: 10 (ref 5–15)
BUN: 12 mg/dL (ref 6–20)
CO2: 26 mmol/L (ref 22–32)
Calcium: 8.3 mg/dL — ABNORMAL LOW (ref 8.9–10.3)
Chloride: 104 mmol/L (ref 98–111)
Creatinine, Ser: 1.13 mg/dL (ref 0.61–1.24)
GFR calc Af Amer: >60 mL/min (ref >60)
GFR calc non Af Amer: >60 mL/min (ref >60)
Glucose, Bld: 86 mg/dL (ref 70–99)
Potassium: 3.8 mmol/L (ref 3.5–5.1)
Sodium: 140 mmol/L (ref 135–145)

## 2020-02-20 LAB — ECHOCARDIOGRAM COMPLETE
Height: 69 in
Weight: 2497.6 oz

## 2020-02-20 LAB — GLUCOSE, CAPILLARY
Glucose-Capillary: 83 mg/dL (ref 70–99)
Glucose-Capillary: 94 mg/dL (ref 70–99)
Glucose-Capillary: 95 mg/dL (ref 70–99)

## 2020-02-20 LAB — HIV ANTIBODY (ROUTINE TESTING W REFLEX): HIV Screen 4th Generation wRfx: NONREACTIVE

## 2020-02-20 LAB — LACTIC ACID, PLASMA
Lactic Acid, Venous: 1.4 mmol/L (ref 0.5–1.9)
Lactic Acid, Venous: 1.4 mmol/L (ref 0.5–1.9)

## 2020-02-20 LAB — MAGNESIUM: Magnesium: 1.8 mg/dL (ref 1.7–2.4)

## 2020-02-20 MED ORDER — THIAMINE HCL 100 MG PO TABS: 100.0000 mg | ORAL_TABLET | Freq: Every day | ORAL | 1 refills | Status: DC

## 2020-02-20 MED ORDER — LISINOPRIL 10 MG PO TABS
10.0000 mg | ORAL_TABLET | Freq: Every day | ORAL | Status: DC
  Administered 2020-02-20: 10 mg via ORAL
  Filled 2020-02-20: qty 1

## 2020-02-20 NOTE — Discharge Summary (Signed)
Physician Discharge Summary  Brad Chen ZWC:[id number] DOB: Jul 16, 1961 DOA: 02/19/2020  PCP: Lauro Regulus, MD  Admit date: 02/19/2020 Discharge date: 02/20/2020  Admitted From: Home Disposition: Home  Recommendations for Outpatient Follow-up:  1. Follow up with PCP in 1-2 weeks 2. Please obtain BMP/CBC in one week 3. Please follow up on the following pending results: Echocardiogram and EEG results  Home Health: No  Equipment/Devices: None Discharge Condition: Stable CODE STATUS: Full Diet recommendation: Heart Healthy   Brief/Interim Summary: Brad Chen is a 59 y.o. male with medical history significant of tobacco abuse, alcohol abuse, hypertension, who presents with unresponsiveness.  Per his wife, patient suddenly became unresponsive when he was in the car as a passenger.  He probably was not responsive for about 1 to 2 minutes.  No seizure activity noted.  Per patient he was doing some heavy activity around the house before going in the car.  He was also feeling little dizzy and weak, really thinks that he was dehydrated so he drank some water and went in the car to go for groceries with his wife.  He was feeling little lightheaded and had a short syncopal episode.  No history of prior similar episodes.  Denies any chest pain or shortness of breath.  Denies any prior history of seizures.  Patient drinks regularly but denies any history of alcohol withdrawal symptoms or seizures.  On arrival he was back to his baseline.  In ED he was having unremarkable work-up except mildly elevated lactic acid and creatinine which resolved with IV fluid.  His alcohol levels were 140 with negative UDS.  We had an extensive discussion regarding quitting alcohol.  We held his lisinopril which she can resumed on discharge. EKG with sinus rhythm and no ST changes.  Troponin remain negative.  Echocardiogram and EEG was done in order to complete syncope work-up, pending results.  His initial  orthostatic vitals were positive. His labs and symptoms were more consistent with dehydration.  He will follow-up with his PCP for further management.  Discharge Diagnoses:  Principal Problem:   Unresponsive Active Problems:   Hypertension   Elevated lactic acid level   AKI (acute kidney injury) (HCC)   Alcohol abuse   Hypocalcemia   Tobacco abuse  Discharge Instructions  Discharge Instructions    Diet - low sodium heart healthy   Complete by: As directed    Discharge instructions   Complete by: As directed    It was pleasure taking care of you. This keep yourself well-hydrated as it seems you were dehydrated. All of your labs are okay but do point out that you are getting some damage due to your alcohol intake.  Try to quit.  Your PCP should be able to help if needed. Follow-up with your PCP for further management.   Increase activity slowly   Complete by: As directed      Allergies as of 02/20/2020   No Known Allergies     Medication List    TAKE these medications   lisinopril 10 MG tablet Commonly known as: ZESTRIL Take 10 mg by mouth daily.   thiamine 100 MG tablet Take 1 tablet (100 mg total) by mouth daily. Start taking on: February 21, 2020      Follow-up Information    Lauro Regulus, MD. Schedule an appointment as soon as possible for a visit.   Specialty: Internal Medicine Contact information: [address] [facility] [place] [address]  [phone]          No Known Allergies  Consultations:  None  Procedures/Studies: CT Head Wo Contrast  Result Date: 02/19/2020 CLINICAL DATA:  Seizure, unresponsive, hypotensive EXAM: CT HEAD WITHOUT CONTRAST TECHNIQUE: Contiguous axial images were obtained from the base of the skull through the vertex without intravenous contrast. COMPARISON:  05/05/2018 and previous FINDINGS: Brain: Mild parenchymal atrophy. Patchy mild areas of hypoattenuation in deep and periventricular white  matter bilaterally. Negative for acute intracranial hemorrhage, mass lesion, acute infarction, midline shift, or mass-effect. Acute infarct may be inapparent on noncontrast CT. Ventricles and sulci symmetric. Vascular: Atherosclerotic and physiologic intracranial calcifications. Skull: Normal. Negative for fracture or focal lesion. Sinuses/Orbits: No acute finding. Other: None IMPRESSION: 1. Negative for bleed or other acute intracranial process. 2. Mild atrophy and nonspecific white matter changes. Electronically Signed   By: Corlis Leak  Hassell M.D.   On: 02/19/2020 14:02   MR BRAIN WO CONTRAST  Result Date: 02/20/2020 CLINICAL DATA:  Initial evaluation for acute syncope/presyncope. Cardiac cause suspected. EXAM: MRI HEAD WITHOUT CONTRAST TECHNIQUE: Multiplanar, multiecho pulse sequences of the brain and surrounding structures were obtained without intravenous contrast. COMPARISON:  Prior head CT from 02/19/2020. FINDINGS: Brain: Diffuse prominence of the CSF containing spaces compatible generalized age-related cerebral atrophy. Mild scattered patchy T2/FLAIR hyperintensity within the periventricular and deep white matter both cerebral hemispheres most consistent with chronic small vessel ischemic disease. No abnormal foci of restricted diffusion to suggest acute or subacute ischemia. Gray-white matter differentiation maintained. No encephalomalacia to suggest chronic cortical infarction. No foci of susceptibility artifact to suggest acute or chronic intracranial hemorrhage. No mass lesion, midline shift or mass effect. No hydrocephalus or extra-axial fluid collection. Pituitary gland suprasellar region within normal limits. Midline structures intact. Vascular: Major intracranial vascular flow voids are maintained. Skull and upper cervical spine: Craniocervical junction within normal limits. Bone marrow signal intensity normal. No scalp soft tissue abnormality. Sinuses/Orbits: Globes and orbital soft tissues within  normal limits. Mild scattered mucosal thickening noted throughout the paranasal sinuses. No air-fluid level to suggest acute sinusitis. Mastoid air cells are clear. Inner ear structures grossly normal. Other: None. IMPRESSION: 1. No acute intracranial abnormality. 2. Mild age-related cerebral atrophy with chronic small vessel ischemic disease. Electronically Signed   By: Rise MuBenjamin  McClintock M.D.   On: 02/20/2020 02:11   EEG adult  Result Date: 02/20/2020 Thana Farreynolds, Leslie, MD     02/20/2020  3:29 PM ELECTROENCEPHALOGRAM REPORT Patient: Brad Chen       Room #: 136A-AA EEG No. ID: [id number] Age: 59 y.o.        Sex: male Requesting Physician: Nelson ChimesAmin Report Date:  02/20/2020       Interpreting Physician: Thana FarrEYNOLDS, LESLIE History: Brad Chen is an 59 y.o. male with an episode of unresponsiveness Medications: Thiamine, Ativan Conditions of Recording:  This is a 21 channel routine scalp EEG performed with bipolar and monopolar montages arranged in accordance to the international 10/20 system of electrode placement. One channel was dedicated to EKG recording. The patient is in the awake and drowsy states. Description:  The waking background activity consists of a low voltage, symmetrical, fairly well organized, 9-10 Hz alpha activity, seen from the parieto-occipital and posterior temporal regions.  Low voltage fast activity, poorly organized, is seen anteriorly and is at times superimposed on more posterior regions.  A mixture of theta and alpha rhythms are seen from the central and temporal regions. The patient drowses with slowing to irregular, low voltage theta and  beta activity.  Stage II sleep is not obtained. No epileptiform activity is noted.  Hyperventilation was not performed.  Intermittent photic stimulation was performed but failed to illicit any change in the tracing. IMPRESSION: Normal electroencephalogram, awake, drowsy and with activation procedures. There are no focal lateralizing or epileptiform  features. Alexis Goodell, MD Neurology [phone] 02/20/2020, 3:26 PM     Subjective: He was feeling better when seen today.  Denies any complaints.  Stating that he feels back to his baseline and really think that he was dehydrated yesterday as he was doing some heavy work around the house.  Discharge Exam: Vitals:   02/20/20 0528 02/20/20 0731  BP: 135/81 (!) 179/84  Pulse: (!) 109 65  Resp: 18 16  Temp: 98.9 F (37.2 C) 98.9 F (37.2 C)  SpO2: 100% 100%   Vitals:   02/20/20 0525 02/20/20 0526 02/20/20 0528 02/20/20 0731  BP: (!) 180/87 (!) 183/83 135/81 (!) 179/84  Pulse: 68 62 (!) 109 65  Resp: 17 17 18 16   Temp: 98.5 F (36.9 C) 98.9 F (37.2 C) 98.9 F (37.2 C) 98.9 F (37.2 C)  TempSrc:      SpO2: 98% 99% 100% 100%  Weight:      Height:        General: Pt is alert, awake, not in acute distress Cardiovascular: RRR, S1/S2 +, no rubs, no gallops Respiratory: CTA bilaterally, no wheezing, no rhonchi Abdominal: Soft, NT, ND, bowel sounds + Extremities: no edema, no cyanosis   The results of significant diagnostics from this hospitalization (including imaging, microbiology, ancillary and laboratory) are listed below for reference.    Microbiology: Recent Results (from the past 240 hour(s))  SARS Coronavirus 2 by RT PCR (hospital order, performed in [facility] hospital lab) Nasopharyngeal Nasopharyngeal Swab     Status: None   Collection Time: 02/19/20  3:21 PM   Specimen: Nasopharyngeal Swab  Result Value Ref Range Status   SARS Coronavirus 2 NEGATIVE NEGATIVE Final    Comment: (NOTE) SARS-CoV-2 target nucleic acids are NOT DETECTED. The SARS-CoV-2 RNA is generally detectable in upper and lower respiratory specimens during the acute phase of infection. The lowest concentration of SARS-CoV-2 viral copies this assay can detect is 250 copies / mL. A negative result does not preclude SARS-CoV-2 infection and should not be used as the sole basis for treatment or  other patient management decisions.  A negative result may occur with improper specimen collection / handling, submission of specimen other than nasopharyngeal swab, presence of viral mutation(s) within the areas targeted by this assay, and inadequate number of viral copies (<250 copies / mL). A negative result must be combined with clinical observations, patient history, and epidemiological information. Fact Sheet for Patients:   StrictlyIdeas.no Fact Sheet for Healthcare Providers: BankingDealers.co.za This test is not yet approved or cleared  by the [place] FDA and has been authorized for detection and/or diagnosis of SARS-CoV-2 by FDA under an Emergency Use Authorization (EUA).  This EUA will remain in effect (meaning this test can be used) for the duration of the COVID-19 declaration under Section 564(b)(1) of the Act, 21 U.S.C. section 360bbb-3(b)(1), unless the authorization is terminated or revoked sooner. Performed at [facility], [address] [place], [place] [address]      Labs: BNP (last 3 results) No results for input(s): BNP in the last 8760 hours. Basic Metabolic Panel: Recent Labs  Lab 02/19/20 1329 02/19/20 1521 02/20/20 0452  NA 137  --  140  K 4.0  --  3.8  CL 104  --  104  CO2 22  --  26  GLUCOSE 91  --  86  BUN 13  --  12  CREATININE 1.39*  --  1.13  CALCIUM 7.4*  --  8.3*  MG  --  1.8 1.8  PHOS  --  2.6  --    Liver Function Tests: Recent Labs  Lab 02/19/20 1329  AST 28  ALT 20  ALKPHOS 39  BILITOT 0.7  PROT 6.0*  ALBUMIN 3.3*   No results for input(s): LIPASE, AMYLASE in the last 168 hours. No results for input(s): AMMONIA in the last 168 hours. CBC: Recent Labs  Lab 02/19/20 1329  WBC 6.7  NEUTROABS 4.6  HGB 11.3*  HCT 32.3*  MCV 97.3  PLT 313   Cardiac Enzymes: No results for input(s): CKTOTAL, CKMB, CKMBINDEX, TROPONINI in the last 168 hours. BNP: Invalid  input(s): POCBNP CBG: Recent Labs  Lab 02/20/20 0733 02/20/20 1154  GLUCAP 83 94   D-Dimer No results for input(s): DDIMER in the last 72 hours. Hgb A1c No results for input(s): HGBA1C in the last 72 hours. Lipid Profile No results for input(s): CHOL, HDL, LDLCALC, TRIG, CHOLHDL, LDLDIRECT in the last 72 hours. Thyroid function studies No results for input(s): TSH, T4TOTAL, T3FREE, THYROIDAB in the last 72 hours.  Invalid input(s): FREET3 Anemia work up No results for input(s): VITAMINB12, FOLATE, FERRITIN, TIBC, IRON, RETICCTPCT in the last 72 hours. Urinalysis    Component Value Date/Time   COLORURINE YELLOW (A) 02/19/2020 1510   APPEARANCEUR CLEAR (A) 02/19/2020 1510   LABSPEC 1.006 02/19/2020 1510   PHURINE 6.0 02/19/2020 1510   GLUCOSEU 50 (A) 02/19/2020 1510   HGBUR NEGATIVE 02/19/2020 1510   BILIRUBINUR NEGATIVE 02/19/2020 1510   KETONESUR 5 (A) 02/19/2020 1510   PROTEINUR NEGATIVE 02/19/2020 1510   NITRITE NEGATIVE 02/19/2020 1510   LEUKOCYTESUR NEGATIVE 02/19/2020 1510   Sepsis Labs Invalid input(s): PROCALCITONIN,  WBC,  LACTICIDVEN Microbiology Recent Results (from the past 240 hour(s))  SARS Coronavirus 2 by RT PCR (hospital order, performed in [facility] hospital lab) Nasopharyngeal Nasopharyngeal Swab     Status: None   Collection Time: 02/19/20  3:21 PM   Specimen: Nasopharyngeal Swab  Result Value Ref Range Status   SARS Coronavirus 2 NEGATIVE NEGATIVE Final    Comment: (NOTE) SARS-CoV-2 target nucleic acids are NOT DETECTED. The SARS-CoV-2 RNA is generally detectable in upper and lower respiratory specimens during the acute phase of infection. The lowest concentration of SARS-CoV-2 viral copies this assay can detect is 250 copies / mL. A negative result does not preclude SARS-CoV-2 infection and should not be used as the sole basis for treatment or other patient management decisions.  A negative result may occur with improper specimen collection /  handling, submission of specimen other than nasopharyngeal swab, presence of viral mutation(s) within the areas targeted by this assay, and inadequate number of viral copies (<250 copies / mL). A negative result must be combined with clinical observations, patient history, and epidemiological information. Fact Sheet for Patients:   BoilerBrush.com.cy Fact Sheet for Healthcare Providers: https://pope.com/ This test is not yet approved or cleared  by the [place] FDA and has been authorized for detection and/or diagnosis of SARS-CoV-2 by FDA under an Emergency Use Authorization (EUA).  This EUA will remain in effect (meaning this test can be used) for the duration of the COVID-19 declaration under Section 564(b)(1) of the Act, 21  U.S.C. section 360bbb-3(b)(1), unless the authorization is terminated or revoked sooner. Performed at [facility], [address] [place], [place] [address]     Time coordinating discharge: Over 30 minutes  SIGNED:  Arnetha Courser, MD  [facility] 02/20/2020, 3:29 PM  If 7PM-7AM, please contact night-coverage [email or web address]  This record has been created using Conservation officer, historic buildings. Errors have been sought and corrected,but may not always be located. Such creation errors do not reflect on the standard of care.

## 2020-02-20 NOTE — Progress Notes (Signed)
PT Cancellation Note  Patient Details Name: Brad Chen MRN: [id number] DOB: September 12, 1961   Cancelled Treatment:    Reason Eval/Treat Not Completed: PT screened, no needs identified, will sign off. Patient states he walked in the room with OT this morning and did fine, reports he has no needs. Feeling fine. PT will sign off at this time.     , 02/20/2020, 3:21 PM

## 2020-02-20 NOTE — Progress Notes (Signed)
Discharge summary reviewed with verbal understanding. Escorted to personal vehicle via wc 

## 2020-02-20 NOTE — Evaluation (Signed)
Occupational Therapy Evaluation Patient Details Name: Brad Chen MRN: [id number] DOB: 10-May-1961 Today's Date: 02/20/2020    History of Present Illness Pt is 59 y/o M with PMH: tobacco abuse, alcohol abuse, and hypertension, who presented with unresponsiveness (spouse reports x1-2 mins, was awake at time of arrival to ED). Adm with elevated lactic acid level, AKI and HTN.   Clinical Impression   Pt seen for OT evaluation this date. Pt at baseline is indep with all ADLs/IADLs and fxl mobility. Pt lives with spouse in [facility] with 1 STE. This date, demos indep with all assessed self care and fxl mobility with no AD. Overall, anticipate pt is safe to return home with spouse supv as needed. No OT f/u need anticipated.     Follow Up Recommendations  No OT follow up    Equipment Recommendations  None recommended by OT    Recommendations for Other Services       Precautions / Restrictions Precautions Precautions: Fall Restrictions Weight Bearing Restrictions: No      Mobility Bed Mobility Overal bed mobility: Independent                Transfers Overall transfer level: Independent                    Balance Overall balance assessment: No apparent balance deficits (not formally assessed)                                         ADL either performed or assessed with clinical judgement   ADL Overall ADL's : Independent;At baseline                                       General ADL Comments: fxl mobility to bathroom with no AD with supv only for IV mgt. toileting with Indep-standing to void, clothing mgt I'ly in standing. LB ADLs seated with Indep, Indep for grooming standing sink-side.     Vision Baseline Vision/History: Wears glasses Wears Glasses: Reading only Patient Visual Report: No change from baseline       Perception     Praxis      Pertinent Vitals/Pain Pain Assessment: No/denies pain     Hand Dominance      Extremity/Trunk Assessment Upper Extremity Assessment Upper Extremity Assessment: Overall WFL for tasks assessed(UE strength grossly 4+/5 to 5/5. ROM WNL)   Lower Extremity Assessment Lower Extremity Assessment: Overall WFL for tasks assessed       Communication Communication Communication: No difficulties   Cognition Arousal/Alertness: Awake/alert Behavior During Therapy: WFL for tasks assessed/performed Overall Cognitive Status: Within Functional Limits for tasks assessed                                 General Comments: slight restlessness/shakiness noted on assessment, but pt is A&O and appropriate with command following t/o   General Comments  some shakiness/tremors present, but do not appear to impede balance    Exercises Other Exercises Other Exercises: OT facilitates education re: Role of OT in acute setting, safety with ADLs/ADL mobility-monitoring for dizziness with position change. Pt with good understanding.   Shoulder Instructions      Home Living Family/patient expects to be discharged to:: Private residence Living Arrangements:  Spouse/significant other Available Help at Discharge: Family;Available 24 hours/day Type of Home: House Home Access: Stairs to enter [employer] of Steps: 1 Entrance Stairs-Rails: Right Home Layout: One level     Bathroom Shower/Tub: Tub/shower unit;Walk-in shower         Home Equipment: None   Additional Comments: reports that spouse has canes that were collectibles from an uncle who has passed, but pt has no AD and used no AD.      Prior Functioning/Environment Level of Independence: Independent        Comments: reprots driving, doing all I/ADLs I'ly        OT Problem List: Decreased activity tolerance      OT Treatment/Interventions:      OT Goals(Current goals can be found in the care plan section) Acute Rehab OT Goals Patient Stated Goal: to go home OT Goal Formulation: All  assessment and education complete, DC therapy  OT Frequency:     Barriers to D/C:            Co-evaluation              AM-PAC OT "6 Clicks" Daily Activity     Outcome Measure Help from another person eating meals?: None Help from another person taking care of personal grooming?: None Help from another person toileting, which includes using toliet, bedpan, or urinal?: None Help from another person bathing (including washing, rinsing, drying)?: None Help from another person to put on and taking off regular upper body clothing?: None Help from another person to put on and taking off regular lower body clothing?: None 6 Click Score: 24   End of Session Nurse Communication: Other (comment)(notified CM of pt performance)  Activity Tolerance: Patient tolerated treatment well Patient left: in bed;with call bell/phone within reach;with bed alarm set  OT Visit Diagnosis: Other abnormalities of gait and mobility (R26.89)                Time: 0347-4259 OT Time Calculation (min): 13 min Charges:  OT General Charges $OT Visit: 1 Visit OT Evaluation $OT Eval Low Complexity: 1 Low  Rejeana Brock, MS, OTR/L ascom [phone] 02/20/20, 9:51 AM

## 2020-02-20 NOTE — Progress Notes (Signed)
PT Cancellation Note  Patient Details Name: Brad Chen MRN: [id number] DOB: 14-Jun-1961   Cancelled Treatment:    Reason Eval/Treat Not Completed: Other (comment). Consult received and chart reviewed. Pt currently out of room and unavailable for therapy. Will re-attempt next available date.   , 02/20/2020, 1:30 PM Elizabeth Palau, PT, DPT [phone]

## 2020-02-20 NOTE — Progress Notes (Signed)
eeg completed ° °

## 2020-02-20 NOTE — Procedures (Signed)
ELECTROENCEPHALOGRAM REPORT   Patient: Brad Chen       Room #: 136A-AA EEG No. ID: [id number] Age: 59 y.o.        Sex: male Requesting Physician: Nelson Chimes Report Date:  02/20/2020        Interpreting Physician: Thana Farr  History: Brad Chen is an 59 y.o. male with an episode of unresponsiveness  Medications:  Thiamine, Ativan  Conditions of Recording:  This is a 21 channel routine scalp EEG performed with bipolar and monopolar montages arranged in accordance to the international 10/20 system of electrode placement. One channel was dedicated to EKG recording.  The patient is in the awake and drowsy states.  Description:  The waking background activity consists of a low voltage, symmetrical, fairly well organized, 9-10 Hz alpha activity, seen from the parieto-occipital and posterior temporal regions.  Low voltage fast activity, poorly organized, is seen anteriorly and is at times superimposed on more posterior regions.  A mixture of theta and alpha rhythms are seen from the central and temporal regions. The patient drowses with slowing to irregular, low voltage theta and beta activity.   Stage II sleep is not obtained. No epileptiform activity is noted.   Hyperventilation was not performed.  Intermittent photic stimulation was performed but failed to illicit any change in the tracing.    IMPRESSION: Normal electroencephalogram, awake, drowsy and with activation procedures. There are no focal lateralizing or epileptiform features.   Thana Farr, MD Neurology [phone] 02/20/2020, 3:26 PM

## 2020-02-20 NOTE — Progress Notes (Signed)
*  PRELIMINARY RESULTS* Echocardiogram 2D Echocardiogram has been performed.  Brad Chen 02/20/2020, 11:58 AM

## 2021-01-26 ENCOUNTER — Emergency Department
Admission: EM | Admit: 2021-01-26 | Discharge: 2021-01-27 | Disposition: A | Discharge status: Home / Self Care | Payer: Federal, State, Local not specified - PPO | Attending: Emergency Medicine | Admitting: Emergency Medicine

## 2021-01-26 DIAGNOSIS — I10 Essential (primary) hypertension: Secondary | ICD-10-CM | POA: Insufficient documentation

## 2021-01-26 DIAGNOSIS — Y900 Blood alcohol level of less than 20 mg/100 ml: Secondary | ICD-10-CM | POA: Insufficient documentation

## 2021-01-26 DIAGNOSIS — F1010 Alcohol abuse, uncomplicated: Secondary | ICD-10-CM | POA: Insufficient documentation

## 2021-01-26 DIAGNOSIS — F17200 Nicotine dependence, unspecified, uncomplicated: Secondary | ICD-10-CM | POA: Insufficient documentation

## 2021-01-26 DIAGNOSIS — Z79899 Other long term (current) drug therapy: Secondary | ICD-10-CM | POA: Insufficient documentation

## 2021-01-26 DIAGNOSIS — R200 Anesthesia of skin: Secondary | ICD-10-CM | POA: Insufficient documentation

## 2021-01-26 LAB — URINE DRUG SCREEN, QUALITATIVE (ARMC ONLY)
Amphetamines, Ur Screen: NOT DETECTED
Barbiturates, Ur Screen: NOT DETECTED
Benzodiazepine, Ur Scrn: NOT DETECTED
Cannabinoid 50 Ng, Ur ~~LOC~~: POSITIVE — AB
Cocaine Metabolite,Ur ~~LOC~~: NOT DETECTED
MDMA (Ecstasy)Ur Screen: NOT DETECTED
Methadone Scn, Ur: NOT DETECTED
Opiate, Ur Screen: NOT DETECTED
Phencyclidine (PCP) Ur S: NOT DETECTED
Tricyclic, Ur Screen: NOT DETECTED

## 2021-01-26 LAB — COMPREHENSIVE METABOLIC PANEL
ALT: 104 U/L — ABNORMAL HIGH (ref 0–44)
AST: 156 U/L — ABNORMAL HIGH (ref 15–41)
Albumin: 4 g/dL (ref 3.5–5.0)
Alkaline Phosphatase: 56 U/L (ref 38–126)
Anion gap: 14 (ref 5–15)
BUN: 16 mg/dL (ref 6–20)
CO2: 25 mmol/L (ref 22–32)
Calcium: 9.2 mg/dL (ref 8.9–10.3)
Chloride: 93 mmol/L — ABNORMAL LOW (ref 98–111)
Creatinine, Ser: 1.07 mg/dL (ref 0.61–1.24)
GFR, Estimated: >60 mL/min (ref >60)
Glucose, Bld: 132 mg/dL — ABNORMAL HIGH (ref 70–99)
Potassium: 3.5 mmol/L (ref 3.5–5.1)
Sodium: 132 mmol/L — ABNORMAL LOW (ref 135–145)
Total Bilirubin: 2 mg/dL — ABNORMAL HIGH (ref 0.3–1.2)
Total Protein: 7.1 g/dL (ref 6.5–8.1)

## 2021-01-26 LAB — CBC
HCT: 38 % — ABNORMAL LOW (ref 39.0–52.0)
Hemoglobin: 13.7 g/dL (ref 13.0–17.0)
MCH: 33.2 pg (ref 26.0–34.0)
MCHC: 36.1 g/dL — ABNORMAL HIGH (ref 30.0–36.0)
MCV: 92 fL (ref 80.0–100.0)
Platelets: 143 10*3/uL — ABNORMAL LOW (ref 150–400)
RBC: 4.13 MIL/uL — ABNORMAL LOW (ref 4.22–5.81)
RDW: 12.7 % (ref 11.5–15.5)
WBC: 10.8 10*3/uL — ABNORMAL HIGH (ref 4.0–10.5)
nRBC: 0 % (ref 0.0–0.2)

## 2021-01-26 LAB — ETHANOL: Alcohol, Ethyl (B): <10 mg/dL (ref <10)

## 2021-01-26 MED ORDER — NICOTINE 21 MG/24HR TD PT24
21.0000 mg | MEDICATED_PATCH | Freq: Once | TRANSDERMAL | Status: DC
  Administered 2021-01-26: 21 mg via TRANSDERMAL
  Filled 2021-01-26: qty 1

## 2021-01-26 MED ORDER — LORAZEPAM 2 MG PO TABS: 0.0000 mg | ORAL_TABLET | Freq: Two times a day (BID) | ORAL | Status: DC

## 2021-01-26 MED ORDER — LORAZEPAM 2 MG/ML IJ SOLN
0.0000 mg | Freq: Four times a day (QID) | INTRAMUSCULAR | Status: DC
  Administered 2021-01-26: 2 mg via INTRAVENOUS
  Filled 2021-01-26: qty 1

## 2021-01-26 MED ORDER — THIAMINE HCL 100 MG/ML IJ SOLN: 100.0000 mg | Freq: Every day | INTRAMUSCULAR | Status: DC

## 2021-01-26 MED ORDER — THIAMINE HCL 100 MG/ML IJ SOLN
Freq: Once | INTRAVENOUS | Status: AC
  Filled 2021-01-26: qty 1000

## 2021-01-26 MED ORDER — LORAZEPAM 2 MG/ML IJ SOLN: 0.0000 mg | Freq: Two times a day (BID) | INTRAMUSCULAR | Status: DC

## 2021-01-26 MED ORDER — THIAMINE HCL 100 MG PO TABS: 100.0000 mg | ORAL_TABLET | Freq: Every day | ORAL | Status: DC

## 2021-01-26 MED ORDER — LORAZEPAM 2 MG PO TABS
0.0000 mg | ORAL_TABLET | Freq: Four times a day (QID) | ORAL | Status: DC
  Administered 2021-01-27: 1 mg via ORAL
  Filled 2021-01-26: qty 1

## 2021-01-26 NOTE — ED Notes (Signed)
Pt resting in bed, medicated per [facility]

## 2021-01-26 NOTE — ED Notes (Signed)
Blood drawn and sent to lab.

## 2021-01-26 NOTE — ED Triage Notes (Signed)
Pt arrives with EMS for alcohol withdrawal.  Pt does not want to go to rehab per EMS.

## 2021-01-26 NOTE — Discharge Instructions (Addendum)
You may take Lorazepam every 8 hours as needed for shaking.  Return to the ER for worsening symptoms, persistent vomiting, difficulty breathing or other concerns

## 2021-01-26 NOTE — ED Notes (Signed)
Pt requesting nicotine patch, MD Derrill Kay made aware via secure chat.

## 2021-01-26 NOTE — ED Provider Notes (Signed)
[facility] Emergency Department Provider Note  ________________________________________   I have reviewed the triage vital signs and the nursing notes.   HISTORY  Chief Complaint Alcohol Problem   History limited by: Not Limited   HPI Brad Chen is a 60 y.o. male who presents to the emergency department today because of concern for alcohol withdrawal. Patient states that his wife was out of town over the past 10 days. He states that during this time he was drinking a prolific amount of beers. He last drank yesterday. Today he has felt sick. Has felt like his legs are numb. Feels anxious. Felt like he was going to day. He says that he has had issues with foot numbness in the past but it has not been as bad as it was today.   Records reviewed. Per medical record review patient has a history of alcohol abuse. HTN.  Past Medical History:  Diagnosis Date  . Hypertension     Patient Active Problem List   Diagnosis Date Noted  . Unresponsive 02/19/2020  . Hypocalcemia 02/19/2020  . Tobacco abuse 02/19/2020  . Hypertension   . Elevated lactic acid level   . AKI (acute kidney injury) (HCC)   . Alcohol abuse     Past Surgical History:  Procedure Laterality Date  . CARPAL TUNNEL RELEASE    . NECK SURGERY      Prior to Admission medications   Medication Sig Start Date End Date Taking? Authorizing Provider  lisinopril (PRINIVIL,ZESTRIL) 10 MG tablet Take 10 mg by mouth daily. 02/25/18   [provider]  thiamine 100 MG tablet Take 1 tablet (100 mg total) by mouth daily. 02/21/20   Arnetha Courser, MD    Allergies Patient has no known allergies.  Family History  Problem Relation Age of Onset  . Hypertension Mother     Social History Social History   Tobacco Use  . Smoking status: Current Every Day Smoker  . Smokeless tobacco: Never Used  Substance Use Topics  . Alcohol use: Yes    Comment: pt denies drinking but etoh 3.0 on scene   . Drug use: Never    Review of Systems Constitutional: No fever/chills Eyes: No visual changes. ENT: No sore throat. Cardiovascular: Denies chest pain. Respiratory: Denies shortness of breath. Gastrointestinal: Positive for nausea.  Genitourinary: Negative for dysuria. Musculoskeletal: Negative for back pain. Skin: Negative for rash. Neurological: Positive for shaking.  ____________________________________________   PHYSICAL EXAM: Constitutional: Alert and oriented.  Eyes: Conjunctivae are normal.  ENT      Head: Normocephalic and atraumatic.      Nose: No congestion/rhinnorhea.      Mouth/Throat: Mucous membranes are moist.      Neck: No stridor. Hematological/Lymphatic/Immunilogical: No cervical lymphadenopathy. Cardiovascular: Normal rate, regular rhythm.  No murmurs, rubs, or gallops.  Respiratory: Normal respiratory effort without tachypnea nor retractions. Breath sounds are clear and equal bilaterally. No wheezes/rales/rhonchi. Gastrointestinal: Soft and non tender. No rebound. No guarding.  Genitourinary: Deferred Musculoskeletal: Normal range of motion in all extremities. No lower extremity edema. Neurologic:  Normal speech and language. Occasional tremors.  Skin:  Skin is warm, dry and intact. No rash noted. Psychiatric: Slightly anxious.  ____________________________________________    LABS (pertinent positives/negatives)  Ethanol <10 CBC wbc 10.8, hgb 13.7, plt 143 CMP na 132, k 3.54, glu 132, cr 1.07, ast 156, alt 104, t bili 2.0  ____________________________________________   EKG  None  ____________________________________________    RADIOLOGY  None  ____________________________________________  PROCEDURES  Procedures  ____________________________________________   INITIAL IMPRESSION / ASSESSMENT AND PLAN / ED COURSE  Pertinent labs & imaging results that were available during my care of the patient were reviewed by me and  considered in my medical decision making (see chart for details).   Patient presented to the emergency department today because of concerns for alcohol withdrawal.  On exam patient did have some tremors and was somewhat anxious.  Do think patient is likely experiencing some withdrawal symptoms.  Will place patient on CIWA.  Will give banana bag. ____________________________________________   FINAL CLINICAL IMPRESSION(S) / ED DIAGNOSES  Final diagnoses:  Alcohol abuse     Note: This dictation was prepared with Dragon dictation. Any transcriptional errors that result from this process are unintentional     Phineas Semen, MD 01/26/21 2349

## 2021-01-26 NOTE — ED Notes (Signed)
Assumed care of pt upon being roomed, provided with a pillow per request. Water, snacks, and [place] sandwich tray provided. Denies SI/HI. Pt plan to detox. Tremors noted in hands at this time. AOx4, breathing is regular and unlabored

## 2021-01-26 NOTE — ED Notes (Signed)
RN spoke with wife for an update via phone, pt also spoke to wife.

## 2021-01-26 NOTE — ED Notes (Signed)
Pt resting in ED litter, ate 50% of snacks and 100% of sandwich provided

## 2021-01-27 MED ORDER — LORAZEPAM 1 MG PO TABS
1.0000 mg | ORAL_TABLET | Freq: Three times a day (TID) | ORAL | 0 refills | Status: DC | PRN
Start: 2021-01-27 — End: 2022-07-18

## 2021-01-27 NOTE — ED Notes (Signed)
MD sung at bedside, pt denies "shakes" at this time. AOx4. Intermittently resting

## 2021-01-27 NOTE — ED Notes (Signed)
Pt called wife, wife will be picking pt up at 0800. DC instructions reviewed with wife via phone.

## 2021-01-27 NOTE — ED Notes (Signed)
Pt resting with eyes closed, breathing regular and unlabored

## 2021-01-27 NOTE — ED Provider Notes (Addendum)
-----------------------------------------   2:32 AM on 01/27/2021 -----------------------------------------  Patient feeling better, denies shaking.  Does not have anyone to pick him up overnight.  Declines detox.  Will remain in the ED overnight until his wife can pick him up in the morning.   ----------------------------------------- 6:26 AM on 01/27/2021 -----------------------------------------  Patient sleeping in no acute distress.  Will discharge home later this morning once wife can pick him up.  Will discharge home with prescription for Ativan to use as needed.    ----------------------------------------- 6:49 AM on 01/27/2021 -----------------------------------------  Patient ambulating with steady gait.  Wife will be here at 8 AM to pick him up.  Strict return precautions given.  Patient verbalizes understanding agrees with plan of care.   Irean Hong, MD 01/27/21 [phone]

## 2021-01-27 NOTE — ED Notes (Signed)
E-signature not working at this time. Pt verbalized understanding of D/C instructions, prescriptions and follow up care with no further questions at this time. Pt in NAD and wheeled to wife's car at time of discharge.

## 2021-01-27 NOTE — ED Notes (Signed)
Pt resting in stretcher in hallway, denies needs or concerns at this time

## 2021-01-27 NOTE — ED Notes (Signed)
Pt calling wife for pick up upon dc

## 2021-06-24 ENCOUNTER — Other Ambulatory Visit: Payer: Self-pay | Admitting: Internal Medicine

## 2021-06-24 DIAGNOSIS — R7989 Other specified abnormal findings of blood chemistry: Secondary | ICD-10-CM

## 2021-07-03 ENCOUNTER — Ambulatory Visit
Admission: RE | Admit: 2021-07-03 | Discharge: 2021-07-03 | Disposition: A | Discharge status: Home / Self Care | Source: Ambulatory Visit | Attending: Internal Medicine | Admitting: Internal Medicine

## 2021-07-03 DIAGNOSIS — R7989 Other specified abnormal findings of blood chemistry: Secondary | ICD-10-CM | POA: Insufficient documentation

## 2022-03-09 ENCOUNTER — Emergency Department
Admission: EM | Admit: 2022-03-09 | Discharge: 2022-03-09 | Disposition: A | Discharge status: Home / Self Care | Attending: Student in an Organized Health Care Education/Training Program | Admitting: Student in an Organized Health Care Education/Training Program

## 2022-03-09 ENCOUNTER — Emergency Department

## 2022-03-09 ENCOUNTER — Other Ambulatory Visit: Payer: Self-pay

## 2022-03-09 DIAGNOSIS — S99912A Unspecified injury of left ankle, initial encounter: Secondary | ICD-10-CM | POA: Diagnosis present

## 2022-03-09 DIAGNOSIS — S93402A Sprain of unspecified ligament of left ankle, initial encounter: Secondary | ICD-10-CM | POA: Insufficient documentation

## 2022-03-09 DIAGNOSIS — X501XXA Overexertion from prolonged static or awkward postures, initial encounter: Secondary | ICD-10-CM | POA: Diagnosis not present

## 2022-03-09 MED ORDER — MELOXICAM 15 MG PO TABS: 15.0000 mg | ORAL_TABLET | Freq: Every day | ORAL | 1 refills | Status: AC

## 2022-03-09 MED ORDER — ONDANSETRON 4 MG PO TBDP
4.0000 mg | ORAL_TABLET | Freq: Once | ORAL | Status: AC
Start: 2022-03-09 — End: 2022-03-09
  Administered 2022-03-09: 4 mg via ORAL
  Filled 2022-03-09: qty 1

## 2022-03-09 MED ORDER — OXYCODONE-ACETAMINOPHEN 5-325 MG PO TABS
1.0000 | ORAL_TABLET | Freq: Once | ORAL | Status: AC
  Administered 2022-03-09: 1 via ORAL
  Filled 2022-03-09: qty 1

## 2022-03-09 NOTE — Discharge Instructions (Signed)
Rest, ice, compression and elevation at home. You can follow-up with podiatry as needed. Take meloxicam once daily for pain and inflammation.

## 2022-03-09 NOTE — ED Notes (Signed)
Pt declined discharge vital signs  

## 2022-03-09 NOTE — ED Provider Notes (Signed)
[facility] Provider Note  Patient Contact: 11:08 PM (approximate)   History   Ankle Injury   HPI  Brad Chen is a 61 y.o. male presents to the emergency department with left sided ankle pain after patient inverted his ankle while putting away his boot.  No numbness or tingling of the left lower extremity.  Patient states that he has had 1 prior ankle sprain many years ago.  Patient can bear weight with some pain.      Physical Exam   Triage Vital Signs: ED Triage Vitals  Enc Vitals Group     BP 03/09/22 2155 (!) 154/96     Pulse Rate 03/09/22 2155 85     Resp 03/09/22 2155 16     Temp 03/09/22 2155 98.3 F (36.8 C)     Temp Source 03/09/22 2155 Oral     SpO2 03/09/22 2155 100 %     Weight 03/09/22 2156 168 lb (76.2 kg)     Height 03/09/22 2156 5\' 10"  (1.778 m)     Head Circumference --      Peak Flow --      Pain Score 03/09/22 2156 3     Pain Loc --      Pain Edu? --      Excl. in GC? --     Most recent vital signs: Vitals:   03/09/22 2155  BP: (!) 154/96  Pulse: 85  Resp: 16  Temp: 98.3 F (36.8 C)  SpO2: 100%     General: Alert and in no acute distress. Eyes:  PERRL. EOMI. Head: No acute traumatic findings ENT:      Nose: No congestion/rhinnorhea.      Mouth/Throat: Mucous membranes are moist.  Neck: No stridor. No cervical spine tenderness to palpation. Cardiovascular:  Good peripheral perfusion Respiratory: Normal respiratory effort without tachypnea or retractions. Lungs CTAB. Good air entry to the bases with no decreased or absent breath sounds. Gastrointestinal: Bowel sounds 4 quadrants. Soft and nontender to palpation. No guarding or rigidity. No palpable masses. No distention. No CVA tenderness. Musculoskeletal: Full range of motion to all extremities.  Patient can move all 5 left toes.  Palpable dorsalis pedis pulse, left. Neurologic:  No gross focal neurologic deficits are appreciated.  Skin:   No rash  noted Other:   ED Results / Procedures / Treatments   Labs (all labs ordered are listed, but only abnormal results are displayed) Labs Reviewed - No data to display      PROCEDURES:  Critical Care performed: No  Procedures   MEDICATIONS ORDERED IN ED: Medications  oxyCODONE-acetaminophen (PERCOCET/ROXICET) 5-325 MG per tablet 1 tablet (1 tablet Oral Given 03/09/22 2254)  ondansetron (ZOFRAN-ODT) disintegrating tablet 4 mg (4 mg Oral Given 03/09/22 2254)     IMPRESSION / MDM / ASSESSMENT AND PLAN / ED COURSE  I reviewed the triage vital signs and the nursing notes.                              Assessment and plan Left ankle pain 61 year old male presents to the emergency department with acute left ankle and foot pain after an inversion type ankle injury.  Patient was mildly hypertensive at triage but vital signs were otherwise reassuring.  He was alert, active and nontoxic-appearing.  X-rays of the left foot and left ankle showed no acute bony abnormalities.  Patient was placed in a cam boot and  meloxicam was prescribed for pain.  Return precautions were given to return with new or worsening symptoms.     FINAL CLINICAL IMPRESSION(S) / ED DIAGNOSES   Final diagnoses:  Sprain of left ankle, unspecified ligament, initial encounter     Rx / DC Orders   ED Discharge Orders          Ordered    meloxicam (MOBIC) 15 MG tablet  Daily        03/09/22 2307             Note:  This document was prepared using Dragon voice recognition software and may include unintentional dictation errors.   Pia Mau [place], PA-C 03/09/22 2310    Phineas Semen, MD 03/09/22 [phone]

## 2022-06-23 ENCOUNTER — Other Ambulatory Visit: Payer: Self-pay | Admitting: Orthopedic Surgery

## 2022-06-23 DIAGNOSIS — M25512 Pain in left shoulder: Secondary | ICD-10-CM

## 2022-07-01 ENCOUNTER — Ambulatory Visit
Admission: RE | Admit: 2022-07-01 | Discharge: 2022-07-01 | Disposition: A | Discharge status: Home / Self Care | Source: Ambulatory Visit | Attending: Orthopedic Surgery | Admitting: Orthopedic Surgery

## 2022-07-01 DIAGNOSIS — M25512 Pain in left shoulder: Secondary | ICD-10-CM | POA: Insufficient documentation

## 2022-07-16 ENCOUNTER — Other Ambulatory Visit: Payer: Self-pay | Admitting: Orthopedic Surgery

## 2022-07-18 ENCOUNTER — Encounter: Payer: Self-pay | Admitting: Urgent Care

## 2022-07-18 ENCOUNTER — Encounter
Admission: RE | Admit: 2022-07-18 | Discharge: 2022-07-18 | Disposition: A | Discharge status: Home / Self Care | Payer: PRIVATE HEALTH INSURANCE | Source: Ambulatory Visit | Attending: Orthopedic Surgery | Admitting: Orthopedic Surgery

## 2022-07-18 ENCOUNTER — Other Ambulatory Visit: Payer: Self-pay

## 2022-07-18 ENCOUNTER — Encounter
Admission: RE | Admit: 2022-07-18 | Discharge: 2022-07-18 | Disposition: A | Discharge status: Home / Self Care | Source: Ambulatory Visit | Attending: Orthopedic Surgery | Admitting: Orthopedic Surgery

## 2022-07-18 VITALS — Ht 68.0 in | Wt 161.0 lb

## 2022-07-18 DIAGNOSIS — I10 Essential (primary) hypertension: Secondary | ICD-10-CM

## 2022-07-18 DIAGNOSIS — Z01810 Encounter for preprocedural cardiovascular examination: Secondary | ICD-10-CM | POA: Diagnosis present

## 2022-07-18 NOTE — Progress Notes (Addendum)
Perioperative Services Pre-Admission/Anesthesia Testing    Date: 07/18/22  Name: Brad Chen MRN:   [id number]  Re: ECG changes and need for further preoperative cardiovascular evaluation  Planned Surgical Procedure(s):    Case: [id number] Date/Time: 07/22/22 1113   Procedure: Left shoulder arthroscopic subscapularis repair, mini open rotator cuff repair vs reconstruction with allograft, subacromial decompression, distal clavicle excision, and biceps tenodesis (Left)   Anesthesia type: Choice   Pre-op diagnosis: Traumatic complete tear of left rotator cuff, initial encounter S46.012A   Location: [place] [facility] [facility]   Surgeons: Leim Fabry, MD   Clinical Notes:  Patient is scheduled for the above procedure on 07/22/2022 with Dr. Leim Fabry, MD.  In preparation for his procedure, patient presented to the PAT clinic on the afternoon of 07/18/2022 for preoperative labs and ECG.  In review of his preoperative ECG, patient with noted inferior TWI in leads II, III, and aVL.  Patient with no significant associated angina or anginal equivalent symptoms.  When compared to previous tracing obtained on 02/19/2020, inferior T wave changes are noted to be new.    Patient with history of hypertension.  He is a current everyday smoker.  He does not use alcohol.  Patient denies known history of cardiovascular disease; does not see a cardiologist.  In review of his EMR, patient did have a TTE on 02/20/2020 that revealed a mildly reduced left ventricular systolic function with an EF of 40-45%.  There were no regional wall motion abnormalities.  Right ventricular size and function normal.  Left atrium mildly dilated.  There was no significant valvular regurgitation.  Transvalvular gradients normal with no evidence of valvular stenosis.  Impression and Plan:  Brad Chen plan to undergo elective orthopedic procedure on 07/22/2022.  Unfortunately, in review of his  preoperative ECG, there are inferior changes concerning for possible ischemia.  Given patient's age, smoking history, and history of hypertension, in the setting of presumably acute ECG findings, patient will need to be seen in consult by cardiology for further evaluation and clearance prior to proceeding.  In the setting of present bleed acute changes, patient was given strict return cautions should he develop any angina/anginal equivalent symptoms.  Patient aware of when to escalate care and seek further evaluation in the ED.  Patient has been made aware that surgery is going to need to be postponed pending cardiovascular evaluation.  Referred patient to [facility] cardiology; referral sent today along with copy of note.  PAT secretary asked to inform Dr. Serita Grit office of need to have patient's case with home pending cardiovascular clearance.  We will send copy of note to Dr. Posey Pronto via [facility] to make him aware.   Orders Placed This Encounter  Procedures   Ambulatory referral to Cardiology    Referral Priority:   Routine    Referral Type:   Consultation    Referral Reason:   Specialty Services Required    Requested Specialty:   Cardiology    Number of Visits Requested:   1    Reason: Patient with presumably acute inferior ECG changes on preoperative tracing.  Patient with no significant angina/anginal equivalent symptoms while in the office.  Patient with several cardiovascular risk factors including age, sex, HTN, and everyday smoking.  Will need to see cardiology, undergo any preoperative testing deemed necessary, and ultimately be cleared by cardiology prior to proceeding with elective procedure. Referring to [facility] cardiology.    Honor Loh, MSN, APRN,  FNP-C, CEN [facility]  Peri-operative Services Nurse Practitioner Phone: [phone] 07/18/22 3:34 PM  NOTE: This note has been prepared using Dragon dictation software. Despite my best ability to proofread,  there is always the potential that unintentional transcriptional errors may still occur from this process.

## 2022-07-18 NOTE — Patient Instructions (Signed)
Your procedure is scheduled on: 07/22/22 Report to [place]. To find out your arrival time please call [phone] between 1PM - 3PM on 07/21/22.  Remember: Instructions that are not followed completely may result in serious medical risk, up to and including death, or upon the discretion of your surgeon and anesthesiologist your surgery may need to be rescheduled.     _X__ 1. Do not eat food after midnight the night before your procedure.                 No gum chewing or hard candies. You may drink clear liquids up to 2 hours                 before you are scheduled to arrive for your surgery- DO not drink clear                 liquids within 2 hours of the start of your surgery.                 Clear Liquids include:  water, apple juice without pulp, clear carbohydrate                 drink such as Clearfast or Gatorade, Black Coffee or Tea (Do not add                 anything to coffee or tea). Diabetics water only  Drink the Ensure "clear" pre surgery drink 2 hours prior to your arrival for surgery  __X__2.  On the morning of surgery brush your teeth with toothpaste and water, you                 may rinse your mouth with mouthwash if you wish.  Do not swallow any              toothpaste of mouthwash.     _X__ 3.  No Alcohol for 24 hours before or after surgery.   _X__ 4.  Do Not Smoke or use e-cigarettes For 24 Hours Prior to Your Surgery.                 Do not use any chewable tobacco products for at least 6 hours prior to                 surgery.  ____  5.  Bring all medications with you on the day of surgery if instructed.   __X__  6.  Notify your doctor if there is any change in your medical condition      (cold, fever, infections).     Do not wear jewelry, make-up, hairpins, clips or nail polish. Do not wear lotions, powders, or perfumes. You may wear deodorant, Do not shave body hair 48 hours prior to surgery. Men  may shave face and neck. Do not bring valuables to the hospital.    [facility] is not responsible for any belongings or valuables.  Contacts, dentures/partials or body piercings may not be worn into surgery. Bring a case for your contacts, glasses or hearing aids, a denture cup will be supplied. Leave your suitcase in the car. After surgery it may be brought to your room. For patients admitted to the hospital, discharge time is determined by your treatment team.   Patients discharged the day of surgery will not be allowed to drive home.   Please read over the following fact sheets that you were  given:   Incentive Spirometer, Ensure drink, CHG soap  __X__ Take these medicines the morning of surgery with A SIP OF WATER:    1. none  2.   3.   4.  5.  6.  ____ Fleet Enema (as directed)   __X__ Use CHG Soap/SAGE wipes as directed  ____ Use inhalers on the day of surgery  ____ Stop metformin/Janumet/Farxiga 2 days prior to surgery    ____ Take 1/2 of usual insulin dose the night before surgery. No insulin the morning          of surgery.   ____ Stop Blood Thinners Coumadin/Plavix/Xarelto/Pleta/Pradaxa/Eliquis/Effient/Aspirin  on   Or contact your Surgeon, Cardiologist or Medical Doctor regarding  ability to stop your blood thinners  __X__ Stop Anti-inflammatories 7 days before surgery such as Advil, Ibuprofen, Motrin,  BC or Goodies Powder, Naprosyn, Naproxen, Aleve, Aspirin    __X__ Stop all herbal supplements, fish oil or vitamins  until after surgery.    ____ Bring C-Pap to the hospital.

## 2022-07-21 MED ORDER — FAMOTIDINE 20 MG PO TABS: 20.0000 mg | ORAL_TABLET | Freq: Once | ORAL | Status: AC

## 2022-07-21 MED ORDER — CEFAZOLIN SODIUM-DEXTROSE 2-4 GM/100ML-% IV SOLN
2.0000 g | INTRAVENOUS | Status: AC
  Administered 2022-07-22 (×2): 2 g via INTRAVENOUS

## 2022-07-22 ENCOUNTER — Ambulatory Visit
Admission: RE | Admit: 2022-07-22 | Discharge: 2022-07-22 | Disposition: A | Discharge status: Home / Self Care | Attending: Orthopedic Surgery | Admitting: Orthopedic Surgery

## 2022-07-22 ENCOUNTER — Ambulatory Visit

## 2022-07-22 ENCOUNTER — Ambulatory Visit: Admitting: Urgent Care

## 2022-07-22 ENCOUNTER — Encounter
Admission: RE | Disposition: A | Discharge status: Home / Self Care | Payer: Self-pay | Source: Home / Self Care | Attending: Orthopedic Surgery

## 2022-07-22 ENCOUNTER — Other Ambulatory Visit: Payer: Self-pay

## 2022-07-22 ENCOUNTER — Encounter: Payer: Self-pay | Admitting: Orthopedic Surgery

## 2022-07-22 DIAGNOSIS — M19012 Primary osteoarthritis, left shoulder: Secondary | ICD-10-CM | POA: Diagnosis not present

## 2022-07-22 DIAGNOSIS — Y93K9 Activity, other involving animal care: Secondary | ICD-10-CM | POA: Insufficient documentation

## 2022-07-22 DIAGNOSIS — M25812 Other specified joint disorders, left shoulder: Secondary | ICD-10-CM | POA: Diagnosis not present

## 2022-07-22 DIAGNOSIS — S46012A Strain of muscle(s) and tendon(s) of the rotator cuff of left shoulder, initial encounter: Secondary | ICD-10-CM | POA: Insufficient documentation

## 2022-07-22 DIAGNOSIS — F17210 Nicotine dependence, cigarettes, uncomplicated: Secondary | ICD-10-CM | POA: Diagnosis not present

## 2022-07-22 DIAGNOSIS — E785 Hyperlipidemia, unspecified: Secondary | ICD-10-CM | POA: Insufficient documentation

## 2022-07-22 HISTORY — PX: SHOULDER ARTHROSCOPY WITH SUBACROMIAL DECOMPRESSION AND OPEN ROTATOR C: SHX5688

## 2022-07-22 SURGERY — SHOULDER ARTHROSCOPY WITH SUBACROMIAL DECOMPRESSION AND OPEN ROTATOR CUFF REPAIR, OPEN BICEPS TENDON REPAIR
Anesthesia: General | Laterality: Left

## 2022-07-22 MED ORDER — LIDOCAINE HCL (CARDIAC) PF 100 MG/5ML IV SOSY
PREFILLED_SYRINGE | INTRAVENOUS | Status: DC | PRN
  Administered 2022-07-22: 100 mg via INTRAVENOUS

## 2022-07-22 MED ORDER — PROPOFOL 10 MG/ML IV BOLUS
INTRAVENOUS | Status: AC
  Filled 2022-07-22: qty 20

## 2022-07-22 MED ORDER — BUPIVACAINE HCL (PF) 0.5 % IJ SOLN
INTRAMUSCULAR | Status: DC | PRN
  Administered 2022-07-22: 7 mL via PERINEURAL
  Administered 2022-07-22: 3 mL via PERINEURAL

## 2022-07-22 MED ORDER — ONDANSETRON 4 MG PO TBDP: 4.0000 mg | ORAL_TABLET | Freq: Three times a day (TID) | ORAL | 0 refills | Status: DC | PRN

## 2022-07-22 MED ORDER — NEOMYCIN-POLYMYXIN B GU 40-200000 IR SOLN
Status: AC
  Filled 2022-07-22: qty 20

## 2022-07-22 MED ORDER — OXYCODONE HCL 5 MG PO TABS: 5.0000 mg | ORAL_TABLET | Freq: Once | ORAL | Status: DC | PRN

## 2022-07-22 MED ORDER — ONDANSETRON HCL 4 MG/2ML IJ SOLN
INTRAMUSCULAR | Status: DC | PRN
  Administered 2022-07-22: 4 mg via INTRAVENOUS

## 2022-07-22 MED ORDER — OXYCODONE HCL 5 MG/5ML PO SOLN: 5.0000 mg | Freq: Once | ORAL | Status: DC | PRN

## 2022-07-22 MED ORDER — FENTANYL CITRATE (PF) 100 MCG/2ML IJ SOLN
INTRAMUSCULAR | Status: AC
  Filled 2022-07-22: qty 2

## 2022-07-22 MED ORDER — EPHEDRINE SULFATE (PRESSORS) 50 MG/ML IJ SOLN
INTRAMUSCULAR | Status: DC | PRN
  Administered 2022-07-22 (×4): 5 mg via INTRAVENOUS

## 2022-07-22 MED ORDER — SODIUM CHLORIDE (PF) 0.9 % IJ SOLN
INTRAMUSCULAR | Status: AC
  Filled 2022-07-22: qty 20

## 2022-07-22 MED ORDER — DEXMEDETOMIDINE HCL IN NACL 80 MCG/20ML IV SOLN
INTRAVENOUS | Status: DC | PRN
  Administered 2022-07-22: 8 ug via BUCCAL
  Administered 2022-07-22: 4 ug via BUCCAL
  Administered 2022-07-22 (×2): 8 ug via BUCCAL

## 2022-07-22 MED ORDER — CHLORHEXIDINE GLUCONATE 0.12 % MT SOLN
OROMUCOSAL | Status: AC
  Administered 2022-07-22: 15 mL via OROMUCOSAL
  Filled 2022-07-22: qty 15

## 2022-07-22 MED ORDER — 0.9 % SODIUM CHLORIDE (POUR BTL) OPTIME
TOPICAL | Status: DC | PRN
  Administered 2022-07-22: 500 mL

## 2022-07-22 MED ORDER — RINGERS IRRIGATION IR SOLN
Status: DC | PRN
  Administered 2022-07-22: 3000 mL

## 2022-07-22 MED ORDER — FENTANYL CITRATE (PF) 100 MCG/2ML IJ SOLN
INTRAMUSCULAR | Status: DC | PRN
  Administered 2022-07-22: 25 ug via INTRAVENOUS
  Administered 2022-07-22 (×3): 50 ug via INTRAVENOUS
  Administered 2022-07-22: 25 ug via INTRAVENOUS

## 2022-07-22 MED ORDER — EPINEPHRINE PF 1 MG/ML IJ SOLN
INTRAMUSCULAR | Status: AC
  Filled 2022-07-22: qty 4

## 2022-07-22 MED ORDER — CHLORHEXIDINE GLUCONATE 0.12 % MT SOLN: 15.0000 mL | Freq: Once | OROMUCOSAL | Status: AC

## 2022-07-22 MED ORDER — CEFAZOLIN SODIUM 1 G IJ SOLR
INTRAMUSCULAR | Status: AC
  Filled 2022-07-22: qty 20

## 2022-07-22 MED ORDER — ASPIRIN 325 MG PO TBEC: 325.0000 mg | DELAYED_RELEASE_TABLET | Freq: Every day | ORAL | 0 refills | Status: AC

## 2022-07-22 MED ORDER — BUPIVACAINE LIPOSOME 1.3 % IJ SUSP
INTRAMUSCULAR | Status: AC
  Filled 2022-07-22: qty 20

## 2022-07-22 MED ORDER — FAMOTIDINE 20 MG PO TABS
ORAL_TABLET | ORAL | Status: AC
  Administered 2022-07-22: 20 mg via ORAL
  Filled 2022-07-22: qty 1

## 2022-07-22 MED ORDER — BUPIVACAINE LIPOSOME 1.3 % IJ SUSP
INTRAMUSCULAR | Status: DC | PRN
  Administered 2022-07-22: 7 mL via PERINEURAL
  Administered 2022-07-22: 13 mL via PERINEURAL

## 2022-07-22 MED ORDER — MIDAZOLAM HCL 2 MG/2ML IJ SOLN: 1.0000 mg | INTRAMUSCULAR | Status: DC | PRN

## 2022-07-22 MED ORDER — OXYCODONE HCL 5 MG PO TABS: 5.0000 mg | ORAL_TABLET | ORAL | 0 refills | Status: DC | PRN

## 2022-07-22 MED ORDER — BUPIVACAINE HCL (PF) 0.5 % IJ SOLN
INTRAMUSCULAR | Status: AC
  Filled 2022-07-22: qty 10

## 2022-07-22 MED ORDER — FENTANYL CITRATE PF 50 MCG/ML IJ SOSY: 50.0000 ug | PREFILLED_SYRINGE | INTRAMUSCULAR | Status: DC | PRN

## 2022-07-22 MED ORDER — ORAL CARE MOUTH RINSE: 15.0000 mL | Freq: Once | OROMUCOSAL | Status: AC

## 2022-07-22 MED ORDER — SUGAMMADEX SODIUM 200 MG/2ML IV SOLN
INTRAVENOUS | Status: DC | PRN
  Administered 2022-07-22: 50 mg via INTRAVENOUS

## 2022-07-22 MED ORDER — MIDAZOLAM HCL 2 MG/2ML IJ SOLN
INTRAMUSCULAR | Status: AC
  Administered 2022-07-22: 1 mg via INTRAVENOUS
  Filled 2022-07-22: qty 2

## 2022-07-22 MED ORDER — FENTANYL CITRATE PF 50 MCG/ML IJ SOSY
PREFILLED_SYRINGE | INTRAMUSCULAR | Status: AC
  Administered 2022-07-22: 50 ug via INTRAVENOUS
  Filled 2022-07-22: qty 1

## 2022-07-22 MED ORDER — PROPOFOL 10 MG/ML IV BOLUS
INTRAVENOUS | Status: DC | PRN
  Administered 2022-07-22: 150 mg via INTRAVENOUS

## 2022-07-22 MED ORDER — ROCURONIUM BROMIDE 100 MG/10ML IV SOLN
INTRAVENOUS | Status: DC | PRN
  Administered 2022-07-22: 40 mg via INTRAVENOUS

## 2022-07-22 MED ORDER — ACETAMINOPHEN 500 MG PO TABS: 1000.0000 mg | ORAL_TABLET | Freq: Three times a day (TID) | ORAL | 2 refills | Status: DC

## 2022-07-22 MED ORDER — LACTATED RINGERS IV SOLN: INTRAVENOUS | Status: DC

## 2022-07-22 MED ORDER — CEFAZOLIN SODIUM-DEXTROSE 2-4 GM/100ML-% IV SOLN
INTRAVENOUS | Status: AC
  Filled 2022-07-22: qty 100

## 2022-07-22 MED ORDER — ACETAMINOPHEN 10 MG/ML IV SOLN
INTRAVENOUS | Status: DC | PRN
  Administered 2022-07-22: 1000 mg via INTRAVENOUS

## 2022-07-22 MED ORDER — PHENYLEPHRINE HCL (PRESSORS) 10 MG/ML IV SOLN
INTRAVENOUS | Status: DC | PRN
  Administered 2022-07-22 (×2): 160 ug via INTRAVENOUS
  Administered 2022-07-22: 80 ug via INTRAVENOUS

## 2022-07-22 MED ORDER — DEXAMETHASONE SODIUM PHOSPHATE 10 MG/ML IJ SOLN
INTRAMUSCULAR | Status: DC | PRN
  Administered 2022-07-22: 10 mg via INTRAVENOUS

## 2022-07-22 MED ORDER — LACTATED RINGERS IV SOLN
INTRAVENOUS | Status: DC | PRN
  Administered 2022-07-22 (×2): 6002 mL

## 2022-07-22 MED ORDER — FENTANYL CITRATE (PF) 100 MCG/2ML IJ SOLN: 25.0000 ug | INTRAMUSCULAR | Status: DC | PRN

## 2022-07-22 SURGICAL SUPPLY — 101 items
ADAPTER IRRIG TUBE 2 SPIKE SOL (ADAPTER) ×2 IMPLANT
ADH SKN CLS APL DERMABOND .7 (GAUZE/BANDAGES/DRESSINGS)
ADPR TBG 2 SPK PMP STRL ASCP (ADAPTER) ×2
ANCH SUT 1.4 SUT TPE BLK/WHT (SUTURE) ×1
ANCH SUT 2 FBRTK KNTLS 1.8 (Anchor) ×3 IMPLANT
ANCH SUT 2 SWLK 19.1 CLS EYLT (Anchor) ×2 IMPLANT
ANCH SUT 2X2.3 TAPE (Anchor) ×1 IMPLANT
ANCH SUT 5 3.9 CRKSW KNTLS (Anchor) ×3 IMPLANT
ANCH SUT SWLK 19.1X4.75 (Anchor) ×3 IMPLANT
ANCHOR 2.3 SP SGL 1.2 XBRAID (Anchor) IMPLANT
ANCHOR 3.9 PEEK CORKSCREW 5MTS (Anchor) IMPLANT
ANCHOR SUT 1.8 FIBERTAK SB KL (Anchor) IMPLANT
ANCHOR SUT BIO SW 4.75X19.1 (Anchor) IMPLANT
ANCHOR SWIVELOCK BIO 4.75X19.1 (Anchor) ×1 IMPLANT
APL PRP STRL LF DISP 70% ISPRP (MISCELLANEOUS) ×1
BLADE SHAVER 4.5X7 STR FR (MISCELLANEOUS) ×1 IMPLANT
BNDG ADH 2 X3.75 FABRIC TAN LF (GAUZE/BANDAGES/DRESSINGS) ×1 IMPLANT
BNDG ADH XL 3.75X2 STRCH LF (GAUZE/BANDAGES/DRESSINGS) ×1
BUR BR 5.5 WIDE MOUTH (BURR) IMPLANT
CANNULA PART THRD DISP 5.75X7 (CANNULA) IMPLANT
CANNULA PARTIAL THREAD 2X7 (CANNULA) IMPLANT
CANNULA TWIST IN 8.25X9CM (CANNULA) IMPLANT
CHLORAPREP W/TINT 26 (MISCELLANEOUS) ×1 IMPLANT
COOLER POLAR GLACIER W/PUMP (MISCELLANEOUS) ×1 IMPLANT
DEFOGGER ANTIFOG KIT (MISCELLANEOUS) IMPLANT
DERMABOND ADVANCED .7 DNX12 (GAUZE/BANDAGES/DRESSINGS) IMPLANT
DRAPE 3/4 80X56 (DRAPES) ×1 IMPLANT
DRAPE INCISE IOBAN 66X45 STRL (DRAPES) ×1 IMPLANT
DRAPE U-SHAPE 47X51 STRL (DRAPES) ×2 IMPLANT
DRSG TEGADERM 4X4.75 (GAUZE/BANDAGES/DRESSINGS) ×3 IMPLANT
DRSG XEROFORM 1X8 (GAUZE/BANDAGES/DRESSINGS) IMPLANT
ELECT REM PT RETURN 9FT ADLT (ELECTROSURGICAL) ×1
ELECTRODE REM PT RTRN 9FT ADLT (ELECTROSURGICAL) ×1 IMPLANT
GAUZE SPONGE 4X4 12PLY STRL (GAUZE/BANDAGES/DRESSINGS) ×1 IMPLANT
GAUZE XEROFORM 1X8 LF (GAUZE/BANDAGES/DRESSINGS) ×1 IMPLANT
GLOVE BIO SURGEON STRL SZ7.5 (GLOVE) ×1 IMPLANT
GLOVE BIOGEL PI IND STRL 8 (GLOVE) ×2 IMPLANT
GLOVE SURG ORTHO 8.0 STRL STRW (GLOVE) ×1 IMPLANT
GLOVE SURG SYN 8.0 (GLOVE) ×1 IMPLANT
GLOVE SURG SYN 8.0 PF PI (GLOVE) ×1 IMPLANT
GOWN STRL REUS W/ TWL LRG LVL3 (GOWN DISPOSABLE) ×2 IMPLANT
GOWN STRL REUS W/TWL LRG LVL3 (GOWN DISPOSABLE) ×2
GOWN STRL REUS W/TWL XL LVL4 (GOWN DISPOSABLE) ×1 IMPLANT
GRAFT TISS 40X70 3 THK DERM (Tissue) IMPLANT
IV LACTATED RINGER IRRG 3000ML (IV SOLUTION) ×6
IV LR IRRIG 3000ML ARTHROMATIC (IV SOLUTION) ×4 IMPLANT
KIT CORKSCREW KNTLS 3.9 S/T/P (INSTRUMENTS) IMPLANT
KIT CVD SPEAR FBRTK 1.8 DRILL (KITS) IMPLANT
KIT STABILIZATION SHOULDER (MISCELLANEOUS) ×1 IMPLANT
KIT SUTURETAK 3.0 INSERT PERC (KITS) IMPLANT
KIT TURNOVER KIT A (KITS) ×1 IMPLANT
MANIFOLD NEPTUNE II (INSTRUMENTS) ×2 IMPLANT
MASK FACE SPIDER DISP (MASK) ×1 IMPLANT
MAT ABSORB  FLUID 56X50 GRAY (MISCELLANEOUS) ×2
MAT ABSORB FLUID 56X50 GRAY (MISCELLANEOUS) ×2 IMPLANT
NDL MAYO CATGUT SZ5 (NEEDLE) ×1
NDL SAFETY ECLIP 18X1.5 (MISCELLANEOUS) ×1 IMPLANT
NDL SCORPION MULTI FIRE (NEEDLE) IMPLANT
NDL SUT 5 .5 CRC TPR PNT MAYO (NEEDLE) IMPLANT
NEEDLE SCORPION MULTI FIRE (NEEDLE) IMPLANT
PACK ARTHROSCOPY SHOULDER (MISCELLANEOUS) ×1 IMPLANT
PAD ABD DERMACEA PRESS 5X9 (GAUZE/BANDAGES/DRESSINGS) IMPLANT
PAD ARMBOARD 7.5X6 YLW CONV (MISCELLANEOUS) ×1 IMPLANT
PAD WRAPON POLAR SHDR XLG (MISCELLANEOUS) ×1 IMPLANT
PASSER SUT FIRSTPASS SELF (INSTRUMENTS) ×1 IMPLANT
PASSER SUT SWIFTSTITCH HIP CRT (INSTRUMENTS) ×1 IMPLANT
PENCIL SMOKE EVACUATOR (MISCELLANEOUS) IMPLANT
SHAVER BLADE BONE CUTTER  5.5 (BLADE) ×1
SHAVER BLADE BONE CUTTER 5.5 (BLADE) ×1 IMPLANT
SLEEVE REMOTE CONTROL 5X12 (DRAPES) IMPLANT
SLING ULTRA II M (MISCELLANEOUS) ×1 IMPLANT
SPONGE T-LAP 18X18 ~~LOC~~+RFID (SPONGE) ×1 IMPLANT
STAPLER SKIN PROX 35W (STAPLE) IMPLANT
STRAP SAFETY 5IN WIDE (MISCELLANEOUS) ×1 IMPLANT
SUT ETHILON 3-0 (SUTURE) ×1 IMPLANT
SUT LASSO 90 DEG CVD (SUTURE) IMPLANT
SUT LASSO 90 DEG SD STR (SUTURE) IMPLANT
SUT MNCRL 4-0 (SUTURE)
SUT MNCRL 4-0 27XMFL (SUTURE)
SUT PROLENE 0 CT 2 (SUTURE) IMPLANT
SUT VIC AB 0 CT1 36 (SUTURE) IMPLANT
SUT VIC AB 2-0 CT2 27 (SUTURE) IMPLANT
SUT XBRAID 1.4 BLK/WHT (SUTURE) IMPLANT
SUT XBRAID 1.4 WHITE/BLUE (SUTURE) IMPLANT
SUTURE MNCRL 4-0 27XMF (SUTURE) IMPLANT
SUTURE TAPE 1.3 40 TPR END (SUTURE) IMPLANT
SUTURE TAPE FIBERLINK 1.3 LOOP (SUTURE) IMPLANT
SUTURETAPE 1.3 40 TPR END (SUTURE) ×2
SUTURETAPE FIBERLINK 1.3 LOOP (SUTURE) ×1
SYSTEM FBRTK BICEPS 1.9 DRILL (Anchor) ×1 IMPLANT
TAPE CLOTH 3X10 WHT NS LF (GAUZE/BANDAGES/DRESSINGS) ×1 IMPLANT
TAPE MICROFOAM 4IN (TAPE) ×1 IMPLANT
TAPE SUT LABRALTAP WHT/BLK (SUTURE) IMPLANT
TISSUE ARTHOFLEX THICK 3MM (Tissue) ×1 IMPLANT
TRAP FLUID SMOKE EVACUATOR (MISCELLANEOUS) ×1 IMPLANT
TUBING CONNECTING 10 (TUBING) IMPLANT
TUBING INFLOW SET DBFLO PUMP (TUBING) ×1 IMPLANT
TUBING OUTFLOW SET DBLFO PUMP (TUBING) ×1 IMPLANT
WAND WEREWOLF FLOW 90D (MISCELLANEOUS) ×1 IMPLANT
WATER STERILE IRR 500ML POUR (IV SOLUTION) ×1 IMPLANT
WRAPON POLAR PAD SHDR XLG (MISCELLANEOUS) ×1

## 2022-07-22 NOTE — Anesthesia Postprocedure Evaluation (Signed)
Anesthesia Post Note  Patient: Brad Chen  Procedure(s) Performed: Left shoulder arthroscopic subscapularis repair, mini open rotator cuff repair vs reconstruction with allograft, subacromial decompression, distal clavicle excision, and biceps tenodesis (Left)  Patient location during evaluation: PACU Anesthesia Type: General Level of consciousness: awake and alert Pain management: pain level controlled Vital Signs Assessment: post-procedure vital signs reviewed and stable Respiratory status: spontaneous breathing, nonlabored ventilation, respiratory function stable and patient connected to nasal cannula oxygen Cardiovascular status: blood pressure returned to baseline and stable Postop Assessment: no apparent nausea or vomiting Anesthetic complications: no   No notable events documented.   Last Vitals:  Vitals:   07/22/22 1815 07/22/22 1828  BP: (!) 98/51 100/60  Pulse: 65 65  Resp: 18 19  Temp:  36.4 C  SpO2: 95% 97%    Last Pain:  Vitals:   07/22/22 1828  TempSrc:   PainSc: 0-No pain                 Ilene Qua

## 2022-07-22 NOTE — Transfer of Care (Signed)
Immediate Anesthesia Transfer of Care Note  Patient: Brad Chen  Procedure(s) Performed: Left shoulder arthroscopic subscapularis repair, mini open rotator cuff repair vs reconstruction with allograft, subacromial decompression, distal clavicle excision, and biceps tenodesis (Left)  Patient Location: PACU  Anesthesia Type:General  Level of Consciousness: drowsy and patient cooperative  Airway & Oxygen Therapy: Patient Spontanous Breathing and Patient connected to nasal cannula oxygen  Post-op Assessment: Report given to RN and Post -op Vital signs reviewed and stable  Post vital signs: Reviewed and stable  Last Vitals:  Vitals Value Taken Time  BP    Temp    Pulse    Resp    SpO2      Last Pain:  Vitals:   07/22/22 1745  TempSrc:   PainSc: Asleep         Complications: No notable events documented.

## 2022-07-22 NOTE — Anesthesia Preprocedure Evaluation (Signed)
Anesthesia Evaluation  Patient identified by MRN, date of birth, ID band Patient awake    Reviewed: Allergy & Precautions, NPO status , Patient's Chart, lab work & pertinent test results  History of Anesthesia Complications Negative for: history of anesthetic complications  Airway Mallampati: III  TM Distance: >3 FB Neck ROM: full    Dental  (+) Chipped   Pulmonary neg shortness of breath, COPD, Current Smoker and Patient abstained from smoking.   Pulmonary exam normal        Cardiovascular Exercise Tolerance: Good hypertension, (-) angina (-) Past MI Normal cardiovascular exam     Neuro/Psych negative neurological ROS  negative psych ROS   GI/Hepatic negative GI ROS, Neg liver ROS,neg GERD  ,,  Endo/Other  negative endocrine ROS    Renal/GU Renal disease     Musculoskeletal   Abdominal   Peds  Hematology negative hematology ROS (+)   Anesthesia Other Findings Patient has cardiac clearance for this procedure.   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   Past Medical History: No date: Hypertension  Past Surgical History: No date: CARPAL TUNNEL RELEASE; Bilateral 2003: NECK SURGERY     Comment:  discectomy     Reproductive/Obstetrics negative OB ROS                             Anesthesia Physical Anesthesia Plan  ASA: 3  Anesthesia Plan: General ETT   Post-op Pain Management:    Induction: Intravenous  PONV Risk Score and Plan: Ondansetron, Dexamethasone, Midazolam and Treatment may vary due to age or medical condition  Airway Management Planned: Oral ETT  Additional Equipment:   Intra-op Plan:   Post-operative Plan: Extubation in OR  Informed Consent: I have reviewed the patients History and Physical, chart, labs and discussed the procedure including the risks, benefits and  alternatives for the proposed anesthesia with the patient or authorized representative who has indicated his/her understanding and acceptance.     Dental Advisory Given  Plan Discussed with: Anesthesiologist, CRNA and Surgeon  Anesthesia Plan Comments: (Patient consented for risks of anesthesia including but not limited to:  - adverse reactions to medications - damage to eyes, teeth, lips or other oral mucosa - nerve damage due to positioning  - sore throat or hoarseness - Damage to heart, brain, nerves, lungs, other parts of body or loss of life  Patient voiced understanding.)       Anesthesia Quick Evaluation

## 2022-07-22 NOTE — Discharge Instructions (Addendum)
POLAR CARE INFORMATION  [email or web address]  How to use Bier Cold Therapy System?  YouTube   [email or web address]  OPERATING INSTRUCTIONS  Start the product With dry hands, connect the transformer to the electrical connection located on the top of the cooler. Next, plug the transformer into an appropriate electrical outlet. The unit will automatically start running at this point.  To stop the pump, disconnect electrical power.  Unplug to stop the product when not in use. Unplugging the Polar Care unit turns it off. Always unplug immediately after use. Never leave it plugged in while unattended. Remove pad.    FIRST ADD WATER TO FILL LINE, THEN ICE---Replace ice when existing ice is almost melted  1 Discuss Treatment with your [place] Practitioner and Use Only as Prescribed 2 Apply Insulation Barrier & Cold Therapy Pad 3 Check for Moisture 4 Inspect Skin Regularly  Tips and Trouble Shooting Usage Tips 1. Use cubed or chunked ice for optimal performance. 2. It is recommended to drain the Pad between uses. To drain the pad, hold the Pad upright with the hose pointed toward the ground. Depress the black plunger and allow water to drain out. 3. You may disconnect the Pad from the unit without removing the pad from the affected area by depressing the silver tabs on the hose coupling and gently pulling the hoses apart. The Pad and unit will seal itself and will not leak. Note: Some dripping during release is normal. 4. DO NOT RUN PUMP WITHOUT WATER! The pump in this unit is designed to run with water. Running the unit without water will cause permanent damage to the pump. 5. Unplug unit before removing lid.  TROUBLESHOOTING GUIDE Pump not running, Water not flowing to the pad, Pad is not getting cold 1. Make sure the transformer is plugged into the wall outlet. 2. Confirm that the ice and water are filled to the indicated levels. 3. Make sure  there are no kinks in the pad. 4. Gently pull on the blue tube to make sure the tube/pad junction is straight. 5. Remove the pad from the treatment site and ll it while the pad is lying at; then reapply. 6. Confirm that the pad couplings are securely attached to the unit. Listen for the double clicks (Figure 1) to confirm the pad couplings are securely attached.  Leaks    Note: Some condensation on the lines, controller, and pads is unavoidable, especially in warmer climates. 1. If using a [facility] Polar Care Cold Therapy unit with a detachable Cold Therapy Pad, and a leak exists (other than condensation on the lines) disconnect the pad couplings. Make sure the silver tabs on the couplings are depressed before reconnecting the pad to the pump hose; then confirm both sides of the coupling are properly clicked in. 2. If the coupling continues to leak or a leak is detected in the pad itself, stop using it and call [facility] at [phone].  Cleaning After use, empty and dry the unit with a soft cloth. Warm water and mild detergent may be used occasionally to clean the pump and tubes.  WARNING: [place] can be cold enough to cause serious injury, including full skin necrosis. Follow these Operating Instructions, and carefully read the Product Insert (see pouch on side of unit) and the Cold Therapy Pad Fitting Instructions (provided with each Cold Therapy Pad) prior to use.       Post-Op Instructions - Rotator Cuff Repair  1.  Bracing: You will wear a shoulder immobilizer or sling for 6 weeks.   2. Driving: No driving for 6 weeks post-op.   3. Activity: No active lifting for 2 months. Wrist, hand, and elbow motion only. Avoid lifting the upper arm away from the body except for hygiene. You are permitted to bend and straighten the elbow passively only (no active elbow motion). You may use your hand and wrist for typing, writing, and managing utensils (cutting food). Do not lift  more than a coffee cup for 8 weeks.  When sleeping or resting, inclined positions (recliner chair or wedge pillow) and a pillow under the forearm for support may provide better comfort for up to 4 weeks.  Avoid long distance travel for 4 weeks.  Return to normal activities after rotator cuff repair repair normally takes 9-12 months on average. If rehab goes very well, may be able to do most activities at 5 months, except overhead or contact sports.  4. Physical Therapy: Begins 3-4 days after surgery, and proceed 1 time per week for the first 6 weeks, then 1-2 times per week from weeks 6-20 post-op.  5. Medications:  - You will be provided a prescription for narcotic pain medicine. After surgery, take 1-2 narcotic tablets every 4 hours if needed for severe pain.  - A prescription for anti-nausea medication will be provided in case the narcotic medicine causes nausea - take 1 tablet every 6 hours only if nauseated.   - Take tylenol 1000 mg (2 Extra Strength tablets or 3 regular strength) every 8 hours for pain.  May decrease or stop tylenol 5 days after surgery if you are having minimal pain. - Take ASA 325mg /day x 2 weeks to help prevent DVTs/PEs (blood clots).  - DO NOT take ANY nonsteroidal anti-inflammatory pain medications (Advil, Motrin, Ibuprofen, Aleve, Naproxen, or Naprosyn). These medicines can inhibit healing of your shoulder repair.    If you are taking prescription medication for anxiety, depression, insomnia, muscle spasm, chronic pain, or for attention deficit disorder, you are advised that you are at a higher risk of adverse effects with use of narcotics post-op, including narcotic addiction/dependence, depressed breathing, death. If you use non-prescribed substances: alcohol, marijuana, cocaine, heroin, methamphetamines, etc., you are at a higher risk of adverse effects with use of narcotics post-op, including narcotic addiction/dependence, depressed breathing, death. You are advised  that taking > 50 morphine milligram equivalents (MME) of narcotic pain medication per day results in twice the risk of overdose or death. For your prescription provided: oxycodone 5 mg - taking more than 6 tablets per day would result in > 50 morphine milligram equivalents (MME) of narcotic pain medication. Be advised that we will prescribe narcotics short-term, for acute post-operative pain only - 3 weeks for major operations such as shoulder repair/reconstruction surgeries.     6. Post-Op Appointment:  Your first post-op appointment will be 10-14 days post-op.  7. Work or School: For most, but not all procedures, we advise staying out of work or school for at least 1 to 2 weeks in order to recover from the stress of surgery and to allow time for healing.   If you need a work or school note this can be provided.   8. Smoking: If you are a smoker, you need to refrain from smoking in the postoperative period. The nicotine in cigarettes will inhibit healing of your shoulder repair and decrease the chance of successful repair. Similarly, nicotine containing products (gum, patches) should be avoided.  Post-operative Brace: Apply and remove the brace you received as you were instructed to at the time of fitting and as described in detail as the brace's instructions for use indicate.  Wear the brace for the period of time prescribed by your physician.  The brace can be cleaned with soap and water and allowed to air dry only.  Should the brace result in increased pain, decreased feeling (numbness/tingling), increased swelling or an overall worsening of your medical condition, please contact your doctor immediately.  If an emergency situation occurs as a result of wearing the brace after normal business hours, please dial 911 and seek immediate medical attention.  Let your doctor know if you have any further questions about the brace issued to you. Refer to the shoulder sling instructions for use if you  have any questions regarding the correct fit of your shoulder sling.  [facility] Customer Care for Troubleshooting: [phone]  Video that illustrates how to properly use a shoulder sling: "Instructions for Proper Use of an Orthopaedic Sling" http://bass.com/       Interscalene Nerve Block with Exparel   For your surgery you have received an Interscalene Nerve Block with Exparel. Nerve Blocks affect many types of nerves, including nerves that control movement, pain and normal sensation.  You may experience feelings such as numbness, tingling, heaviness, weakness or the inability to move your arm or the feeling or sensation that your arm has "fallen asleep". A nerve block with Exparel can last up to 5 days.  Usually the weakness wears off first.  The tingling and heaviness usually wear off next.  Finally you may start to notice pain.  Keep in mind that this may occur in any order.  Once a nerve block starts to wear off it is usually completely gone within 60 minutes. ISNB may cause mild shortness of breath, a hoarse voice, blurry vision, unequal pupils, or drooping of the face on the same side as the nerve block.  These symptoms will usually resolve with the numbness.  Very rarely the procedure itself can cause mild seizures. If needed, your surgeon will give you a prescription for pain medication.  It will take about 60 minutes for the oral pain medication to become fully effective.  So, it is recommended that you start taking this medication before the nerve block first begins to wear off, or when you first begin to feel discomfort. Take your pain medication only as prescribed.  Pain medication can cause sedation and decrease your breathing if you take more than you need for the level of pain that you have. Nausea is a common side effect of many pain medications.  You may want to eat something before taking your pain medicine to prevent nausea. After an Interscalene nerve  block, you cannot feel pain, pressure or extremes in temperature in the effected arm.  Because your arm is numb it is at an increased risk for injury.  To decrease the possibility of injury, please practice the following:  While you are awake change the position of your arm frequently to prevent too much pressure on any one area for prolonged periods of time.  If you have a cast or tight dressing, check the color or your fingers every couple of hours.  Call your surgeon with the appearance of any discoloration (white or blue). If you are given a sling to wear before you go home, please wear it  at all times until the block has completely worn off.  Do not  get up at night without your sling. Please contact [facility] or your surgeon if you do not begin to regain sensation after 7 days from the surgery.  Anesthesia may be contacted by calling the Same Day Surgery Department, Mon. through Fri., 6 am to 4 pm at [phone].   If you experience any other problems or concerns, please contact your surgeon's office. If you experience severe or prolonged shortness of breath go to the nearest emergency department. SHOULDER SLING IMMOBILIZER   VIDEO Slingshot 2 Shoulder Brace Application - YouTube ---[email or web address]  INSTRUCTIONS While supporting the injured arm, slide the forearm into the sling. Wrap the adjustable shoulder strap around the neck and shoulders and attach the strap end to the sling using  the "alligator strap tab."  Adjust the shoulder strap to the required length. Position the shoulder pad behind the neck. To secure the shoulder pad location (optional), pull the shoulder strap away from the shoulder pad, unfold the hook material on the top of the pad, then press the shoulder strap back onto the hook material to secure the pad in place. Attach the closure strap across the open top of the sling. Position the strap so that it holds the arm securely in the sling.  Next, attach the thumb strap to the open end of the sling between the thumb and fingers. After sling has been fit, it may be easily removed and reapplied using the quick release buckle on shoulder strap. If a neutral pillow or 15 abduction pillow is included, place the pillow at the waistline. Attach the sling to the pillow, lining up hook material on the pillow with the loop on sling. Adjust the waist strap to fit.  If waist strap is too long, cut it to fit. Use the small piece of double sided hook material (located on top of the pillow) to secure the strap end. Place the double sided hook material on the inside of the cut strap end and secure it to the waist strap.     If no pillow is included, attach the waist strap to the sling and adjust to fit.    Washing Instructions: Straps and sling must be removed and cleaned regularly depending on your activity level and perspiration. Hand wash straps and sling in cold water with mild detergent, rinse, air dry     AMBULATORY SURGERY  DISCHARGE INSTRUCTIONS   The drugs that you were given will stay in your system until tomorrow so for the next 24 hours you should not:  Drive an automobile Make any legal decisions Drink any alcoholic beverage   You may resume regular meals tomorrow.  Today it is better to start with liquids and gradually work up to solid foods.  You may eat anything you prefer, but it is better to start with liquids, then soup and crackers, and gradually work up to solid foods.   Please notify your doctor immediately if you have any unusual bleeding, trouble breathing, redness and pain at the surgery site, drainage, fever, or pain not relieved by medication.    Additional Instructions:   PLEASE GREEN ARMBAND ON FOR 4 DAYS   Please contact your physician with any problems or Same Day Surgery at [phone], Monday through Friday 6 am to 4 pm, or [place] at [facility] number at [phone].

## 2022-07-22 NOTE — Anesthesia Procedure Notes (Signed)
Procedure Name: Intubation Date/Time: 07/22/2022 12:07 PM  Performed by: Johnna Acosta, CRNAPre-anesthesia Checklist: Patient identified, Emergency Drugs available, Suction available, Patient being monitored and Timeout performed Patient Re-evaluated:Patient Re-evaluated prior to induction Oxygen Delivery Method: Circle system utilized Preoxygenation: Pre-oxygenation with 100% oxygen Induction Type: IV induction Ventilation: Mask ventilation without difficulty Laryngoscope Size: McGraph and 3 Grade View: Grade I Tube type: Oral Tube size: 7.0 mm Number of attempts: 1 Airway Equipment and Method: Stylet and Video-laryngoscopy Placement Confirmation: ETT inserted through vocal cords under direct vision, positive ETCO2 and breath sounds checked- equal and bilateral Secured at: 24 cm Tube secured with: Tape Dental Injury: Teeth and Oropharynx as per pre-operative assessment

## 2022-07-22 NOTE — Progress Notes (Signed)
  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 Leim Fabry, MD.  In preparation for his surgery, patient presented to the PAT clinic last week for preoperative testing.  Preoperative ECG revealed inferior changes concerning for ischemia.  Patient was referred to cardiology for further evaluation and preoperative clearance.  Patient was seen in consult by cardiology on 07/21/2022.  Notes unavailable for review; pending dictation.  It appears as if patient underwent exercise stress test yesterday, however again the results are not available for review at this time.  Clearance was received from Drain, PA-C indicating that patient was optimized for surgery and he was "cleared to proceed at an overall LOW risk of experiencing significant perioperative cardiovascular complications"  Copy of signed clearance form was sent to [facility] for inclusion with patient's medical record.   Honor Loh, MSN, APRN, FNP-C, CEN [facility]  Peri-operative Services Nurse Practitioner Phone: [phone] 07/22/22 9:30 AM  NOTE: This note has been prepared using Dragon dictation software. Despite my best ability to proofread, there is always the potential that unintentional transcriptional errors may still occur from this  process.

## 2022-07-22 NOTE — H&P (Signed)
Paper H&P to be scanned into permanent record. H&P reviewed. No significant changes noted. Patient cleared by Cardiology team to proceed with surgery today.

## 2022-07-22 NOTE — Anesthesia Procedure Notes (Signed)
Anesthesia Regional Block: Interscalene brachial plexus block   Pre-Anesthetic Checklist: , timeout performed,  Correct Patient, Correct Site, Correct Laterality,  Correct Procedure, Correct Position, site marked,  Risks and benefits discussed,  Surgical consent,  Pre-op evaluation,  At surgeon's request and post-op pain management  Laterality: Upper and Left  Prep: chloraprep       Needles:  Injection technique: Single-shot  Needle Type: Stimiplex     Needle Length: 9cm  Needle Gauge: 22     Additional Needles:   Procedures:,,,, ultrasound used (permanent image in chart),,    Narrative:  Start time: 07/22/2022 10:05 AM End time: 07/22/2022 10:08 AM Injection made incrementally with aspirations every 5 mL.  Performed by: Personally  Anesthesiologist: , Precious Haws, MD  Additional Notes: Patient consented for risk and benefits of nerve block including but not limited to nerve damage, failed block, bleeding and infection.  Patient voiced understanding.  Functioning IV was confirmed and monitors were applied.  Timeout done prior to procedure and prior to any sedation being given to the patient.  Patient confirmed procedure site prior to any sedation given to the patient.  A 64mm 22ga Stimuplex needle was used. Sterile prep,hand hygiene and sterile gloves were used.  Minimal sedation used for procedure.  No paresthesia endorsed by patient during the procedure.  Negative aspiration and negative test dose prior to incremental administration of local anesthetic. The patient tolerated the procedure well with no immediate complications.

## 2022-07-22 NOTE — Op Note (Signed)
SURGERY DATE: 07/22/2022    PRE-OP DIAGNOSIS:  1. Left rotator cuff tear (subscapularis, supraspinatus) 2. Left subacromial impingement 3. Left biceps tendinopathy 4. Left acromioclavicular joint arthritis   POST-OP DIAGNOSIS: 1. Left rotator cuff tear (subscapularis, supraspinatus) 2. Left subacromial impingement 3. Left biceps tendinopathy 4. Left acromioclavicular joint arthritis   PROCEDURES:  1. Left arthroscopic rotator cuff repair (subscapularis) 2. Left mini-open rotator cuff reconstruction using allograft patch (superior capsular reconstruction) 3. Left open biceps tenodesis 4. Left arthroscopic extensive debridement of shoulder (glenohumeral and subacromial spaces) 5. Left arthroscopic subacromial decompression 6. Left arthroscopic distal clavicle excision   SURGEON: Cato Mulligan, MD   ASSISTANT: Anitra Lauth, PA   ANESTHESIA: Gen with Exparil interscalene block    ESTIMATED BLOOD LOSS: 25cc   DRAINS:  none   TOTAL IV FLUIDS: per anesthesia      SPECIMENS: none   IMPLANTS:   - Arthrex 4.51m SwiveLock x 5 - Arthrex Knotless FiberTak x 3 - Arthrex FiberTak Suture Anchor - Double Loaded x 1   OPERATIVE FINDINGS:  Examination under anesthesia: A careful examination under anesthesia was performed.  Passive range of motion was: FF: 150; ER at side: 40; ER in abduction: 90; IR in abduction: 50.  Anterior load shift: NT.  Posterior load shift: NT.  Sulcus in neutral: NT.  Sulcus in ER: NT.     Intra-operative findings: A thorough arthroscopic examination of the shoulder was performed.  The findings are: 1. Biceps tendon: tendinopathy with medial subluxation 2. Superior labrum: injected with surrounding synovitis 3. Posterior labrum and capsule: normal 4. Inferior capsule and inferior recess: normal 5. Glenoid cartilage surface: Grade 1 changes  6. Supraspinatus attachment: full-thickness tear with retraction to the glenoid 7. Posterior rotator cuff attachment:  Small tear of the anterior aspect of the infraspinatus remainder of the posterior rotator cuff is intact 8. Humeral head articular cartilage: Grade 1-2 changes 9. Rotator interval: significant synovitis 10: Subscapularis tendon: Partial-thickness articular sided tear of the superior fibers 11. Anterior labrum: degenerative 12. IGHL: significant synovitis around IGHL   OPERATIVE REPORT:    Indications for procedure: Brad RADLERis a 61y.o. male with 2-3 years of shoulder pain. Since that time, he has failed non-operative management including activity modification, medical management, and corticosteroid injections without adequate relief of symptoms.  He recently was raising a garage door and felt a pop in his shoulder.  Clinical exam and MRI were suggestive of rotator cuff tear including partial subscapularis, complete supraspinatus, and anterior infraspinatus tears; subacromial impingement; acromioclavicular joint arthritis; and biceps tendinopathy/tearing.  There was significant atrophy of the supraspinatus on MRI suggesting a more chronic tear.  Given the patient's continued symptoms, we decided to proceed with surgical management. After discussion of risks, benefits, and alternatives to surgery, the patient elected to proceed.    Procedure in detail:   I identified Brad PREZIOSIin the pre-operative holding area.  I marked the operative shoulder with my initials. I reviewed the risks and benefits of the proposed surgical intervention, and the patient (and/or patient's guardian) wished to proceed.  Anesthesia was then performed with an Exparil interscalene block.  The patient was transferred to the operative suite and placed in the beach chair position.     SCDs were placed on the lower extremities. Appropriate IV antibiotics were administered prior to incision. The operative upper extremity was then prepped and draped in standard fashion. A time out was performed confirming the correct  extremity, correct patient,  and correct procedure.    I then created a standard posterior portal with an 11 blade. The glenohumeral joint was easily entered with a blunt trochar and the arthroscope introduced. The findings of diagnostic arthroscopy are described above.  A standard anterior portal was made.  I debrided degenerative tissue including the synovitic tissue about the rotator interval and IGHL as well as the anterior and superior labrum. I then coagulated the inflamed synovium in these regions to obtain hemostasis and reduce the risk of post-operative swelling using an Arthrocare radiofrequency device.   The subscapularis tear was identified.  A superior anterolateral portal was made under needle localization.  A 7 mm cannula was placed.  The, comma tissue indicating the superolateral border of the subscapularis was identified readily.  The tip of the coracoid as well as the conjoined tendon and coracoacromial ligaments were visualized after debriding rotator interval tissue.  Tissue about the subscapularis was released anteriorly, superiorly, and posteriorly to allow for improved mobilization.  The lesser tuberosity footprint was prepared with a combination of electrocautery and an arthroscopic curette.  A Scorpion suture passing device was used to pass a SutureTape in a mattress fashion through the subscapularis tendon.  These were passed from the anterolateral portal and after passage the sutures were retrieved from the anterior portal. These were passed through a 4.75 mm SwiveLock and placed into the lesser tuberosity at the footprint of the subscapularis tendon with the arm in a neutral position.  1 of the #2 FiberWire sutures from the anchor was additionally passed through the subscapularis and this was tied, further securing the repair.  This appropriately reduced the subscapularis tear.  The arm was then internally and externally rotated and the subscapularis was noted to move appropriately  with rotation.   Next, the arthroscope was then introduced into the subacromial space.    A direct lateral portal was created with an 11-blade after spinal needle localization. An extensive subacromial bursectomy was performed using a combination of the shaver and Arthrocare wand. The entire acromial undersurface was exposed and the CA ligament was subperiosteally elevated to expose the anterior acromial hook. A 5.40m barrel burr was used to create a flat anterior and lateral aspect of the acromion, converting it from a Type 2 to a Type 1 acromion. Care was made to keep the deltoid fascia intact.    I then turned my attention to the arthroscopic distal clavicle excision. I identified the acromioclavicular joint. Surrounding bursal tissue was debrided and the edges of the joint were identified. I used the 5.522mbarrel burr to remove the distal clavicle parallel to the edge of the acromion. I was able to fit two widths of the burr into the space between the distal clavicle and acromion, signifying that I had removed ~1199mf distal clavicle. This was confirmed by viewing anteriorly and introducing a probe with measuring marks from the lateral portal. Hemostasis was achieved with an Arthrocare wand. Findings of retracted, supraspinatus tear was confirmed.  Tissue superior and inferior to the rotator cuff released, but despite this, the rotator cuff could not be mobilized to its footprint. Therefore decision was made to perform a rotator cuff reconstruction by superior capsular reconstruction with allograft patch.   Using an Arthrocare wand, the anterior, superior, and posterior aspects of the glenoid were cleaned of soft tissue as these regions would serve as points of fixation. Through the anterior portal, a Knotless SutureTak anchor was placed appropriately. Next, percutaneous incisions were made in Neviaser's portal and  posteriorly off the posterior edge of the acromion to place the superior and posterior  Knotless SutureTaks. Next, 2 medial row anchors from the Arthrex speed bridge kit were placed at the articular margin open (one anteriorly and one posteriorly).  Next the spaces between these anchors and the glenoid anchors were measured after the arm was abducted ~20 degrees.    A longitudinal incision from the anterolateral acromion ~6cm in length was made overlying the raphe between the anterior and middle heads of the deltoid.  This incision also incorporated the anterolateral portal.  The raphe was identified and it was incised. The subacromial space was identified. Any remaining bursa was excised.    We then turned our attention to the biceps tenodesis. The arm was externally rotated.  The bicipital groove was identified.  A 15 blade was used to make a cut overlying the biceps tendon, and the tendon was removed using a right angle clamp.  The base of the bicipital groove was identified and cleared of soft tissue.  A FiberTak anchor was placed in the bicipital groove.  The biceps tendon was held at the appropriate amount of tension.  One set of sutures was passed through the biceps anchor with one limb passed in a simple fashion and the second limb passed in a simple plus locking stitch pattern.  This was repeated for the other set of sutures.  This construct allowed for shuttling the biceps tendon down to the bone.  The sutures were tied and cut.  The diseased portion of the proximal biceps was then excised.   Based on these prior measurements, the ArthroFlex 5m graft was cut appropriately.  The medial glenoid sutures were passed appropriately in a mattress fashion using a scorpion suture passer.  Using the knotless mechanism, all 3 glenoid anchor sutures were shuttled.  Next the medial row FiberTapes were passed appropriately through the graft.  The graft was then shuttled into an appropriate position by pulling on the glenoid anchor sutures.  Next, the 2 Lateral Row swivel lock anchors were placed  appropriately resulting in compression of the allograft on the rotator cuff footprint.  3 side-to-side sutures were placed between the posterior edge of the allograft and the infraspinatus.  An additional anterior side to side suture was placed between the anterior edge of the graft and the subscapularis. The construct was stable with external and internal rotation, and it resisted superior migration of the humeral head.   The wound was thoroughly irrigated.  The deltoid split was closed with 0 Vicryl.  The subdermal layer was closed with 2-0 Vicryl.  The skin was closed with 4-0 Monocryl and Dermabond. The portals were closed with 3-0 Nylon. Xeroform was applied to the incisions. A sterile dressing was applied, followed by a Polar Care sleeve and a SlingShot shoulder immobilizer/sling. The patient was awakened from anesthesia without difficulty and was transferred to the PACU in stable condition.      COMPLICATIONS: none   DISPOSITION: plan for discharge home after recovery in PACU     POSTOPERATIVE PLAN: Remain in sling (except hygiene and elbow/wrist/hand RoM exercises as instructed by PT) x 6 weeks and NWB for this time. PT to begin 3-4 days after surgery.  Use massive rotator cuff repair/superior capsular reconstruction rehab protocol with subscapularis repair and biceps tenodesis.  ASA 3263mdaily x 2 weeks for DVT ppx.

## 2022-07-23 ENCOUNTER — Encounter: Payer: Self-pay | Admitting: Orthopedic Surgery

## 2022-10-27 ENCOUNTER — Other Ambulatory Visit: Payer: Self-pay | Admitting: Unknown Physician Specialty

## 2022-10-29 LAB — SURGICAL PATHOLOGY

## 2022-11-12 DIAGNOSIS — R911 Solitary pulmonary nodule: Secondary | ICD-10-CM

## 2022-11-12 DIAGNOSIS — I2510 Atherosclerotic heart disease of native coronary artery without angina pectoris: Secondary | ICD-10-CM

## 2022-11-12 HISTORY — DX: Solitary pulmonary nodule: R91.1

## 2022-11-12 HISTORY — DX: Atherosclerotic heart disease of native coronary artery without angina pectoris: I25.10

## 2022-11-25 DIAGNOSIS — C109 Malignant neoplasm of oropharynx, unspecified: Secondary | ICD-10-CM

## 2022-11-25 HISTORY — DX: Malignant neoplasm of oropharynx, unspecified: C10.9

## 2022-11-25 HISTORY — PX: PHARYNGECTOMY: SUR1024

## 2023-01-14 DIAGNOSIS — I771 Stricture of artery: Secondary | ICD-10-CM

## 2023-01-14 HISTORY — DX: Stricture of artery: I77.1

## 2023-01-27 ENCOUNTER — Emergency Department
Admission: EM | Admit: 2023-01-27 | Discharge: 2023-01-27 | Disposition: A | Discharge status: Home / Self Care | Attending: Student in an Organized Health Care Education/Training Program | Admitting: Student in an Organized Health Care Education/Training Program

## 2023-01-27 ENCOUNTER — Other Ambulatory Visit: Payer: Self-pay

## 2023-01-27 ENCOUNTER — Encounter: Payer: Self-pay | Admitting: *Deleted

## 2023-01-27 ENCOUNTER — Emergency Department

## 2023-01-27 DIAGNOSIS — S6992XA Unspecified injury of left wrist, hand and finger(s), initial encounter: Secondary | ICD-10-CM | POA: Diagnosis present

## 2023-01-27 DIAGNOSIS — W230XXA Caught, crushed, jammed, or pinched between moving objects, initial encounter: Secondary | ICD-10-CM | POA: Insufficient documentation

## 2023-01-27 LAB — COMPREHENSIVE METABOLIC PANEL
ALT: 28 U/L (ref 0–44)
AST: 21 U/L (ref 15–41)
Albumin: 3.7 g/dL (ref 3.5–5.0)
Alkaline Phosphatase: 68 U/L (ref 38–126)
Anion gap: 8 (ref 5–15)
BUN: 15 mg/dL (ref 8–23)
CO2: 24 mmol/L (ref 22–32)
Calcium: 8.2 mg/dL — ABNORMAL LOW (ref 8.9–10.3)
Chloride: 108 mmol/L (ref 98–111)
Creatinine, Ser: 0.99 mg/dL (ref 0.61–1.24)
GFR, Estimated: >60 mL/min (ref >60)
Glucose, Bld: 88 mg/dL (ref 70–99)
Potassium: 3.7 mmol/L (ref 3.5–5.1)
Sodium: 140 mmol/L (ref 135–145)
Total Bilirubin: 0.7 mg/dL (ref 0.3–1.2)
Total Protein: 6.8 g/dL (ref 6.5–8.1)

## 2023-01-27 LAB — CBC WITH DIFFERENTIAL/PLATELET
Abs Immature Granulocytes: 0.05 10*3/uL (ref 0.00–0.07)
Basophils Absolute: 0.1 10*3/uL (ref 0.0–0.1)
Basophils Relative: 1 %
Eosinophils Absolute: 0.2 10*3/uL (ref 0.0–0.5)
Eosinophils Relative: 2 %
HCT: 41.4 % (ref 39.0–52.0)
Hemoglobin: 13.7 g/dL (ref 13.0–17.0)
Immature Granulocytes: 0 %
Lymphocytes Relative: 27 %
Lymphs Abs: 3.2 10*3/uL (ref 0.7–4.0)
MCH: 31.7 pg (ref 26.0–34.0)
MCHC: 33.1 g/dL (ref 30.0–36.0)
MCV: 95.8 fL (ref 80.0–100.0)
Monocytes Absolute: 0.6 10*3/uL (ref 0.1–1.0)
Monocytes Relative: 5 %
Neutro Abs: 7.8 10*3/uL — ABNORMAL HIGH (ref 1.7–7.7)
Neutrophils Relative %: 65 %
Platelets: 225 10*3/uL (ref 150–400)
RBC: 4.32 MIL/uL (ref 4.22–5.81)
RDW: 13.5 % (ref 11.5–15.5)
WBC: 11.9 10*3/uL — ABNORMAL HIGH (ref 4.0–10.5)
nRBC: 0 % (ref 0.0–0.2)

## 2023-01-27 LAB — LACTIC ACID, PLASMA
Lactic Acid, Venous: 2.2 mmol/L (ref 0.5–1.9)
Lactic Acid, Venous: 0.9 mmol/L (ref 0.5–1.9)

## 2023-01-27 MED ORDER — CEPHALEXIN 500 MG PO CAPS: 500.0000 mg | ORAL_CAPSULE | Freq: Three times a day (TID) | ORAL | 0 refills | Status: DC

## 2023-01-27 MED ORDER — SODIUM CHLORIDE 0.9 % IV BOLUS (SEPSIS)
1000.0000 mL | Freq: Once | INTRAVENOUS | Status: AC
  Administered 2023-01-27: 1000 mL via INTRAVENOUS

## 2023-01-27 MED ORDER — OXYCODONE HCL 5 MG PO TABS: 5.0000 mg | ORAL_TABLET | Freq: Three times a day (TID) | ORAL | 0 refills | Status: DC | PRN

## 2023-01-27 MED ORDER — METRONIDAZOLE 500 MG/100ML IV SOLN
500.0000 mg | Freq: Once | INTRAVENOUS | Status: AC
  Administered 2023-01-27: 500 mg via INTRAVENOUS
  Filled 2023-01-27: qty 100

## 2023-01-27 MED ORDER — VANCOMYCIN HCL IN DEXTROSE 1-5 GM/200ML-% IV SOLN: 1000.0000 mg | Freq: Once | INTRAVENOUS | Status: DC

## 2023-01-27 MED ORDER — CEPHALEXIN 500 MG PO CAPS: 500.0000 mg | ORAL_CAPSULE | Freq: Three times a day (TID) | ORAL | 0 refills | Status: AC

## 2023-01-27 MED ORDER — VANCOMYCIN HCL 1500 MG/300ML IV SOLN
1500.0000 mg | Freq: Once | INTRAVENOUS | Status: AC
  Administered 2023-01-27: 1500 mg via INTRAVENOUS
  Filled 2023-01-27: qty 300

## 2023-01-27 MED ORDER — LACTATED RINGERS IV SOLN: INTRAVENOUS | Status: DC

## 2023-01-27 MED ORDER — SODIUM CHLORIDE 0.9 % IV BOLUS
1000.0000 mL | Freq: Once | INTRAVENOUS | Status: AC
  Administered 2023-01-27: 1000 mL via INTRAVENOUS

## 2023-01-27 MED ORDER — ONDANSETRON HCL 4 MG/2ML IJ SOLN
4.0000 mg | Freq: Once | INTRAMUSCULAR | Status: AC
  Administered 2023-01-27: 4 mg via INTRAVENOUS
  Filled 2023-01-27: qty 2

## 2023-01-27 MED ORDER — SODIUM CHLORIDE 0.9 % IV BOLUS (SEPSIS): 1000.0000 mL | Freq: Once | INTRAVENOUS | Status: DC

## 2023-01-27 MED ORDER — SODIUM CHLORIDE 0.9 % IV BOLUS (SEPSIS): 250.0000 mL | Freq: Once | INTRAVENOUS | Status: DC

## 2023-01-27 MED ORDER — SODIUM CHLORIDE 0.9 % IV SOLN
2.0000 g | Freq: Once | INTRAVENOUS | Status: AC
  Administered 2023-01-27: 2 g via INTRAVENOUS
  Filled 2023-01-27: qty 12.5

## 2023-01-27 MED ORDER — MORPHINE SULFATE (PF) 4 MG/ML IV SOLN
4.0000 mg | INTRAVENOUS | Status: DC | PRN
  Administered 2023-01-27: 4 mg via INTRAVENOUS
  Filled 2023-01-27: qty 1

## 2023-01-27 MED ORDER — OXYCODONE HCL 5 MG PO TABS: 5.0000 mg | ORAL_TABLET | ORAL | Status: DC | PRN

## 2023-01-27 NOTE — ED Notes (Signed)
Critical lactic 2.2 

## 2023-01-27 NOTE — ED Provider Notes (Signed)
[facility] Provider Note    Event Date/Time   First MD Initiated Contact with Patient 01/27/23 1639     (approximate)   History   Finger Injury   HPI  Brad Chen is a 62 y.o. male who presents to the ER for eval ration of moderate to severe pain of the left hand occurred after the patient slammed his hand in a car door 2-3 weeks ago.  Has noted some necrotic changes to the skin of the index and middle finger.  Has not been able to sleep secondary to pain.  Is a heavy smoker.  Feels very anxious.  Denies any history of diabetes.     Physical Exam   Triage Vital Signs: ED Triage Vitals  Enc Vitals Group     BP 01/27/23 1550 (!) 182/86     Pulse Rate 01/27/23 1544 64     Resp 01/27/23 1544 18     Temp 01/27/23 1544 98.3 F (36.8 C)     Temp Source 01/27/23 1544 Oral     SpO2 01/27/23 1544 95 %     Weight 01/27/23 1550 150 lb (68 kg)     Height 01/27/23 1550 5\' 8"  (1.727 m)     Head Circumference --      Peak Flow --      Pain Score 01/27/23 1550 10     Pain Loc --      Pain Edu? --      Excl. in GC? --     Most recent vital signs: Vitals:   01/27/23 1544 01/27/23 1550  BP:  (!) 182/86  Pulse: 64   Resp: 18   Temp: 98.3 F (36.8 C)   SpO2: 95%      Constitutional: Alert  Eyes: Conjunctivae are normal.  Head: Atraumatic. Nose: No congestion/rhinnorhea. Mouth/Throat: Mucous membranes are moist.   Neck: Painless ROM.  Cardiovascular:   Good peripheral circulation. Respiratory: Normal respiratory effort.  No retractions.  Gastrointestinal: Soft and nontender.  Musculoskeletal:  no deformity Neurologic:  MAE spontaneously. No gross focal neurologic deficits are appreciated.  Skin:  Skin is warm, dry and intact. No rash noted. Psychiatric: Mood and affect are normal. Speech and behavior are normal.    ED Results / Procedures / Treatments   Labs (all labs ordered are listed, but only abnormal results are displayed) Labs  Reviewed  CBC WITH DIFFERENTIAL/PLATELET - Abnormal; Notable for the following components:      Result Value   WBC 11.9 (*)    Neutro Abs 7.8 (*)    All other components within normal limits  LACTIC ACID, PLASMA - Abnormal; Notable for the following components:   Lactic Acid, Venous 2.2 (*)    All other components within normal limits  COMPREHENSIVE METABOLIC PANEL - Abnormal; Notable for the following components:   Calcium 8.2 (*)    All other components within normal limits  CULTURE, BLOOD (SINGLE)  CULTURE, BLOOD (SINGLE)  LACTIC ACID, PLASMA     EKG     RADIOLOGY Please see ED Course for my review and interpretation.  I personally reviewed all radiographic images ordered to evaluate for the above acute complaints and reviewed radiology reports and findings.  These findings were personally discussed with the patient.  Please see medical record for radiology report.    PROCEDURES:  Critical Care performed: No  Procedures   MEDICATIONS ORDERED IN ED: Medications  lactated ringers infusion (0 mLs Intravenous Hold 01/27/23 2022)  morphine (PF) 4 MG/ML injection 4 mg (4 mg Intravenous Given 01/27/23 1929)  oxyCODONE (Oxy IR/ROXICODONE) immediate release tablet 5 mg (has no administration in time range)  sodium chloride 0.9 % bolus 1,000 mL (0 mLs Intravenous Stopped 01/27/23 2110)  sodium chloride 0.9 % bolus 1,000 mL (0 mLs Intravenous Stopped 01/27/23 2110)  ceFEPIme (MAXIPIME) 2 g in sodium chloride 0.9 % 100 mL IVPB (0 g Intravenous Stopped 01/27/23 1944)  metroNIDAZOLE (FLAGYL) IVPB 500 mg (0 mg Intravenous Stopped 01/27/23 2123)  vancomycin (VANCOREADY) IVPB 1500 mg/300 mL (0 mg Intravenous Stopped 01/27/23 2133)  ondansetron (ZOFRAN) injection 4 mg (4 mg Intravenous Given 01/27/23 1929)     IMPRESSION / MDM / ASSESSMENT AND PLAN / ED COURSE  I reviewed the triage vital signs and the nursing notes.                              Differential diagnosis includes, but  is not limited to, gangrene, cellulitis, osteo-, NSTI, limb ischemia, fracture  Patient presenting to the ER for evaluation of symptoms as described above.  Based on symptoms, risk factors and considered above differential, this presenting complaint could reflect a potentially life-threatening illness therefore the patient will be placed on continuous pulse oximetry and telemetry for monitoring.  Laboratory evaluation will be sent to evaluate for the above complaints.  Patient has small areas of necrotic tissue and tips of his second and third digit.  Has strong radial and ulnar pulses.  Not consistent with limb ischemia.  Will evaluate for sepsis.  Will evaluate for osteo fracture.   Clinical Course as of 01/27/23 2228  Tue Jan 27, 2023  2005 Still awaiting metabolic panel but fortunately no findings of osteo on x-ray.  Had discussion with patient regarding concern for possible cellulitis and infection and I have ordered broad-spectrum antibiotics and IV fluids.  Recommended hospitalization for pain management and IV antibiotics and consultation.  Patient declining this stating prefer outpatient follow-up. [PR]  2216 Reassessed.  Repeat lactate normal.  Again discussed recommendation for admission for IV antibiotics and further evaluation.  Patient declining this.  I have discussed the case in consultation with orthopedics who will help arrange close outpatient follow-up with their hand specialist.  Discussed strict return precautions. [PR]    Clinical Course User Index [PR] Willy Eddy, MD     FINAL CLINICAL IMPRESSION(S) / ED DIAGNOSES   Final diagnoses:  Injury of finger of left hand, initial encounter     Rx / DC Orders   ED Discharge Orders          Ordered    cephALEXin (KEFLEX) 500 MG capsule  3 times daily        01/27/23 2223    oxyCODONE (ROXICODONE) 5 MG immediate release tablet  Every 8 hours PRN        01/27/23 2223             Note:  This document was  prepared using Dragon voice recognition software and may include unintentional dictation errors.    Willy Eddy, MD 01/27/23 2228

## 2023-01-27 NOTE — ED Triage Notes (Signed)
Pt has pain in left index finger.  Pt slammed it in a door 2-3 weeks ago.  Nailbed black and gangrene. Pt alert  speech clear.  Hx throat cancer.

## 2023-01-27 NOTE — Consult Note (Signed)
PHARMACY -  BRIEF ANTIBIOTIC NOTE   Pharmacy has received consult(s) for cefepime and vancomycin from an ED provider.  The patient's profile has been reviewed for ht/wt/allergies/indication/available labs.    One time order(s) placed for  Cefepime 2 gram Vancomycin 1500 mg   Further antibiotics/pharmacy consults should be ordered by admitting physician if indicated.                       Thank you, Sharen Hones, PharmD, BCPS Clinical Pharmacist   01/27/2023  6:35 PM

## 2023-01-27 NOTE — ED Notes (Signed)
Pt refusing xray and blood work at this time

## 2023-01-29 LAB — CULTURE, BLOOD (SINGLE): Special Requests: ADEQUATE

## 2023-01-30 LAB — CULTURE, BLOOD (SINGLE)

## 2023-02-01 LAB — CULTURE, BLOOD (SINGLE): Culture: NO GROWTH

## 2023-02-03 ENCOUNTER — Ambulatory Visit (INDEPENDENT_AMBULATORY_CARE_PROVIDER_SITE_OTHER): Admitting: Vascular Surgery

## 2023-02-03 VITALS — BP 110/71 | HR 62 | Resp 17 | Ht 68.0 in | Wt 153.2 lb

## 2023-02-03 DIAGNOSIS — I96 Gangrene, not elsewhere classified: Secondary | ICD-10-CM | POA: Diagnosis not present

## 2023-02-03 DIAGNOSIS — I10 Essential (primary) hypertension: Secondary | ICD-10-CM

## 2023-02-03 DIAGNOSIS — Z720 Tobacco use: Secondary | ICD-10-CM | POA: Diagnosis not present

## 2023-02-03 NOTE — Assessment & Plan Note (Signed)
blood pressure control important in reducing the progression of atherosclerotic disease. On appropriate oral medications.  

## 2023-02-03 NOTE — H&P (View-Only) (Signed)
  Patient ID: Brad Chen, male   DOB: 04/07/1961, 62 y.o.   MRN: [id number]  Chief Complaint  Patient presents with   Establish Care    HPI Brad Chen is a 62 y.o. male.  I am asked to see the patient by Dr. Anderson for evaluation of gangrenous changes to the left index finger.  He also has small nonhealing ulcerations on the third finger and the fifth finger.  This came from shutting his finger in a door several weeks ago.  His left index finger swelled up and became infected and was treated with antibiotics.  He continues to be a little swollen discolored in the middle phalanx with the distal phalanx having fairly extensive gangrenous changes.  Hand itself is warm and well-perfused without any obvious ulceration or infection.  Patient has a long history of tobacco use for many years.  He had a left shoulder surgery about 6 months ago and has had some tingling and numbness in the left hand since that time.  He has also had previous carpal tunnel surgeries as well as discectomy for neck surgery.   Past Medical History:  Diagnosis Date   Hypertension   Head and neck cancer status postresection  Past Surgical History:  Procedure Laterality Date   CARPAL TUNNEL RELEASE Bilateral    NECK SURGERY  2003   discectomy   SHOULDER ARTHROSCOPY WITH SUBACROMIAL DECOMPRESSION AND OPEN ROTATOR C Left 07/22/2022   Procedure: Left shoulder arthroscopic subscapularis repair, mini open rotator cuff repair vs reconstruction with allograft, subacromial decompression, distal clavicle excision, and biceps tenodesis;  Surgeon: Patel, Sunny, MD;  Location: [facility] ORS;  Service: Orthopedics;  Laterality: Left;     Family History  Problem Relation Age of Onset   Hypertension Mother   No bleeding or clotting disorders No aneurysms   Social History   Tobacco Use   Smoking status: Former    Packs/day: 1    Types: Cigarettes   Smokeless tobacco: Never  Vaping Use   Vaping Use: Never used   Substance Use Topics   Alcohol use: Not Currently    Comment: 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 90 tablet 2   cephALEXin (KEFLEX) 500 MG capsule Take 1 capsule (500 mg total) by mouth 3 (three) times daily for 7 days. 21 capsule 0   gabapentin (NEURONTIN) 300 MG capsule      NITRO-BID 2 % ointment      oxyCODONE (ROXICODONE) 5 MG immediate release tablet Take 1-2 tablets (5-10 mg total) by mouth every 4 (four) hours as needed (pain). 30 tablet 0   sulfamethoxazole-trimethoprim (BACTRIM DS) 800-160 MG tablet      No current facility-administered medications for this visit.      REVIEW OF SYSTEMS (Negative unless checked)  Constitutional: []Weight loss  []Fever  []Chills Cardiac: []Chest pain   []Chest pressure   []Palpitations   []Shortness of breath when laying flat   []Shortness of breath at rest   []Shortness of breath with exertion. Vascular:  []Pain in legs with walking   []Pain in legs at rest   []Pain in legs when laying flat   []Claudication   []Pain in feet when walking  []Pain in feet at rest  []Pain in   feet when laying flat   []History of DVT   []Phlebitis   []Swelling in legs   []Varicose veins   [x]Non-healing ulcers Pulmonary:   []Uses home oxygen   []Productive cough   []Hemoptysis   []Wheeze  []COPD   []Asthma Neurologic:  []Dizziness  []Blackouts   []Seizures   []History of stroke   []History of TIA  []Aphasia   []Temporary blindness   []Dysphagia   []Weakness or numbness in arms   []Weakness or numbness in legs Musculoskeletal:  []Arthritis   []Joint swelling   []Joint pain   []Low back pain Hematologic:  []Easy bruising  []Easy bleeding   []Hypercoagulable state   []Anemic  []Hepatitis Gastrointestinal:  []Blood in stool   []Vomiting blood   []Gastroesophageal reflux/heartburn   []Abdominal pain Genitourinary:  []Chronic kidney disease   []Difficult urination  []Frequent urination  []Burning with urination   []Hematuria Skin:  []Rashes   [x]Ulcers   [x]Wounds Psychological:  []History of anxiety   [] History of major depression.    Physical Exam BP 110/71 (BP Location: Right Arm)   Pulse 62   Resp 17   Ht 5' 8" (1.727 m)   Wt 153 lb 3.2 oz (69.5 kg)   BMI 23.29 kg/m  Gen:  WD/WN, NAD Head: [place]/AT, No temporalis wasting.  Ear/Nose/Throat: Hearing grossly intact, nares w/o erythema or drainage, oropharynx w/o Erythema/Exudate Eyes: Conjunctiva clear, sclera non-icteric  Neck: trachea midline.  No JVD.  Pulmonary:  Good air movement, respirations not labored, no use of accessory muscles  Cardiac: RRR, no JVD Vascular:  Vessel Right Left  Radial Palpable Palpable                                   Gastrointestinal:. No masses, surgical incisions, or scars. Musculoskeletal: M/S 5/5 throughout.  Left hand with gangrenous changes to the distal phalanx of the index finger.  Third and fifth fingers have small superficial ulcerations present.  Fingers have somewhat sluggish capillary refill throughout.  The hand is warm and well-perfused in general.  There is mild swelling of the left second finger.  No purulent drainage or erythema currently. Neurologic: Sensation grossly intact in extremities.  Symmetrical.  Speech is fluent. Motor exam as listed above. Psychiatric: Judgment intact, Mood & affect appropriate for pt's clinical situation. Dermatologic: Left hand wounds as described above.    Radiology DG Hand Complete Left  Result Date: 01/27/2023 CLINICAL DATA:  Gangrenous changes in the second and third digits, initial encounter EXAM: LEFT HAND - COMPLETE 3+ VIEW COMPARISON:  None Available. FINDINGS: No acute fracture or dislocation is noted. Some soft tissue loss is noted distally in the second digit consistent with  the given clinical history. No bony erosive changes are seen. IMPRESSION: Mild soft tissue loss in the distal aspect of the second digit consistent with the given clinical history. No acute bony abnormality is noted. Electronically Signed   By: Mark  Lukens M.D.   On: 01/27/2023 17:53    Labs Recent Results (from the past 2160 hour(s))  CBC with Differential     Status: Abnormal   Collection Time: 01/27/23  5:08 PM  Result Value Ref Range   WBC 11.9 (H) 4.0 - 10.5 K/uL   RBC 4.32 4.22 - 5.81 MIL/uL   Hemoglobin 13.7 13.0 - 17.0 g/dL   HCT 41.4 39.0 - 52.0 %   MCV 95.8 80.0 - 100.0 fL     MCH 31.7 26.0 - 34.0 pg   MCHC 33.1 30.0 - 36.0 g/dL   RDW 13.5 11.5 - 15.5 %   Platelets 225 150 - 400 K/uL   nRBC 0.0 0.0 - 0.2 %   Neutrophils Relative % 65 %   Neutro Abs 7.8 (H) 1.7 - 7.7 K/uL   Lymphocytes Relative 27 %   Lymphs Abs 3.2 0.7 - 4.0 K/uL   Monocytes Relative 5 %   Monocytes Absolute 0.6 0.1 - 1.0 K/uL   Eosinophils Relative 2 %   Eosinophils Absolute 0.2 0.0 - 0.5 K/uL   Basophils Relative 1 %   Basophils Absolute 0.1 0.0 - 0.1 K/uL   Immature Granulocytes 0 %   Abs Immature Granulocytes 0.05 0.00 - 0.07 K/uL    Comment: Performed at [facility], [address] [place], [place] [address]  Lactic acid, plasma     Status: Abnormal   Collection Time: 01/27/23  5:08 PM  Result Value Ref Range   Lactic Acid, Venous 2.2 (HH) 0.5 - 1.9 mmol/L    Comment: CRITICAL RESULT CALLED TO, READ BACK BY AND VERIFIED WITH LES WILLOUGHBY 01/27/23 1829 MU Performed at [facility], [address] [place], [place] [address]   Comprehensive metabolic panel     Status: Abnormal   Collection Time: 01/27/23  7:50 PM  Result Value Ref Range   Sodium 140 135 - 145 mmol/L   Potassium 3.7 3.5 - 5.1 mmol/L   Chloride 108 98 - 111 mmol/L   CO2 24 22 - 32 mmol/L   Glucose, Bld 88 70 - 99 mg/dL    Comment: Glucose reference range applies only to samples taken after fasting for at  least 8 hours.   BUN 15 8 - 23 mg/dL   Creatinine, Ser 0.99 0.61 - 1.24 mg/dL   Calcium 8.2 (L) 8.9 - 10.3 mg/dL   Total Protein 6.8 6.5 - 8.1 g/dL   Albumin 3.7 3.5 - 5.0 g/dL   AST 21 15 - 41 U/L   ALT 28 0 - 44 U/L   Alkaline Phosphatase 68 38 - 126 U/L   Total Bilirubin 0.7 0.3 - 1.2 mg/dL   GFR, Estimated >60 >60 mL/min    Comment: (NOTE) Calculated using the CKD-EPI Creatinine Equation (2021)    Anion gap 8 5 - 15    Comment: Performed at [facility], [address] [place], [place] [address]  Culture, blood (single)     Status: None   Collection Time: 01/27/23  7:50 PM   Specimen: BLOOD  Result Value Ref Range   Specimen Description BLOOD BLOOD LEFT FOREARM    Special Requests      BOTTLES DRAWN AEROBIC AND ANAEROBIC Blood Culture adequate volume   Culture      NO GROWTH 5 DAYS Performed at [facility], [address] [place], [place] [address]    Report Status 02/01/2023 FINAL   Lactic acid, plasma     Status: None   Collection Time: 01/27/23  9:11 PM  Result Value Ref Range   Lactic Acid, Venous 0.9 0.5 - 1.9 mmol/L    Comment: Performed at [facility], [address] [place], [place] [address]    Assessment/Plan:  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  He has a long and heavy history of tobacco use and I would be highly   concerned of Buerger's disease or small vessel disease of the arteries in the hand.  His hand itself appears well-perfused and his fingers do not.  He does have a strong radial pulse.  I think the best option for evaluating his perfusion particularly before he sees the hand surgeon next week will be with an angiogram of the left upper extremity.  I discussed the risks and benefits of the procedure.  I discussed that if large vessel disease is seen, we can certainly evaluate and consider for intervention but  for small vessel disease there may not be much that I have to offer.  I discussed smoking cessation being very important to try to augment her chance of wound healing.  He is clearly going to have at least digital loss of the tip of the left great index finger  Hypertension blood pressure control important in reducing the progression of atherosclerotic disease. On appropriate oral medications.   Tobacco abuse Has worked diligently on cutting back and we discussed that absolute smoking cessation is going to be critical for his hand and wound healing.        02/03/2023, 3:53 PM   This note was created with Dragon medical transcription system.  Any errors from dictation are unintentional.   

## 2023-02-03 NOTE — Assessment & Plan Note (Signed)
Has worked diligently on cutting back and we discussed that absolute smoking cessation is going to be critical for his hand and wound healing.

## 2023-02-03 NOTE — Progress Notes (Signed)
Patient ID: Brad Chen, male   DOB: [birth year], 62 y.o.   MRN: [id number]  Chief Complaint  Patient presents with   Establish Care    HPI Brad Chen is a 62 y.o. male.  I am asked to see the patient by Dr. Dareen Piano for evaluation of gangrenous changes to the left index finger.  He also has small nonhealing ulcerations on the third finger and the fifth finger.  This came from shutting his finger in a door several weeks ago.  His left index finger swelled up and became infected and was treated with antibiotics.  He continues to be a little swollen discolored in the middle phalanx with the distal phalanx having fairly extensive gangrenous changes.  Hand itself is warm and well-perfused without any obvious ulceration or infection.  Patient has a long history of tobacco use for many years.  He had a left shoulder surgery about 6 months ago and has had some tingling and numbness in the left hand since that time.  He has also had previous carpal tunnel surgeries as well as discectomy for neck surgery.   Past Medical History:  Diagnosis Date   Hypertension   Head and neck cancer status postresection  Past Surgical History:  Procedure Laterality Date   CARPAL TUNNEL RELEASE Bilateral    NECK SURGERY  2003   discectomy   SHOULDER ARTHROSCOPY WITH SUBACROMIAL DECOMPRESSION AND OPEN ROTATOR C Left 07/22/2022   Procedure: Left shoulder arthroscopic subscapularis repair, mini open rotator cuff repair vs reconstruction with allograft, subacromial decompression, distal clavicle excision, and biceps tenodesis;  Surgeon: Signa Kell, MD;  Location: [facility] ORS;  Service: Orthopedics;  Laterality: Left;     Family History  Problem Relation Age of Onset   Hypertension Mother   No bleeding or clotting disorders No aneurysms   Social History   Tobacco Use   Smoking status: Former    Packs/day: 1    Types: Cigarettes   Smokeless tobacco: Never  Vaping Use   Vaping Use: Never used   Substance Use Topics   Alcohol use: Not Currently    Comment: 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 90 tablet 2   cephALEXin (KEFLEX) 500 MG capsule Take 1 capsule (500 mg total) by mouth 3 (three) times daily for 7 days. 21 capsule 0   gabapentin (NEURONTIN) 300 MG capsule      NITRO-BID 2 % ointment      oxyCODONE (ROXICODONE) 5 MG immediate release tablet Take 1-2 tablets (5-10 mg total) by mouth every 4 (four) hours as needed (pain). 30 tablet 0   sulfamethoxazole-trimethoprim (BACTRIM DS) 800-160 MG tablet      No current facility-administered medications for this visit.      REVIEW OF SYSTEMS (Negative unless checked)  Constitutional: [] Weight loss  [] Fever  [] Chills Cardiac: [] Chest pain   [] Chest pressure   [] Palpitations   [] Shortness of breath when laying flat   [] Shortness of breath at rest   [] Shortness of breath with exertion. Vascular:  [] Pain in legs with walking   [] Pain in legs at rest   [] Pain in legs when laying flat   [] Claudication   [] Pain in feet when walking  [] Pain in feet at rest  [] Pain in  feet when laying flat   [] History of DVT   [] Phlebitis   [] Swelling in legs   [] Varicose veins   [x] Non-healing ulcers Pulmonary:   [] Uses home oxygen   [] Productive cough   [] Hemoptysis   [] Wheeze  [] COPD   [] Asthma Neurologic:  [] Dizziness  [] Blackouts   [] Seizures   [] History of stroke   [] History of TIA  [] Aphasia   [] Temporary blindness   [] Dysphagia   [] Weakness or numbness in arms   [] Weakness or numbness in legs Musculoskeletal:  [] Arthritis   [] Joint swelling   [] Joint pain   [] Low back pain Hematologic:  [] Easy bruising  [] Easy bleeding   [] Hypercoagulable state   [] Anemic  [] Hepatitis Gastrointestinal:  [] Blood in stool   [] Vomiting blood   [] Gastroesophageal reflux/heartburn   [] Abdominal pain Genitourinary:  [] Chronic kidney disease   [] Difficult urination  [] Frequent urination  [] Burning with urination   [] Hematuria Skin:  [] Rashes   [x] Ulcers   [x] Wounds Psychological:  [] History of anxiety   []  History of major depression.    Physical Exam BP 110/71 (BP Location: Right Arm)   Pulse 62   Resp 17   Ht 5\' 8"  (1.727 m)   Wt 153 lb 3.2 oz (69.5 kg)   BMI 23.29 kg/m  Gen:  WD/WN, NAD Head: [place]/AT, No temporalis wasting.  Ear/Nose/Throat: Hearing grossly intact, nares w/o erythema or drainage, oropharynx w/o Erythema/Exudate Eyes: Conjunctiva clear, sclera non-icteric  Neck: trachea midline.  No JVD.  Pulmonary:  Good air movement, respirations not labored, no use of accessory muscles  Cardiac: RRR, no JVD Vascular:  Vessel Right Left  Radial Palpable Palpable                                   Gastrointestinal:. No masses, surgical incisions, or scars. Musculoskeletal: M/S 5/5 throughout.  Left hand with gangrenous changes to the distal phalanx of the index finger.  Third and fifth fingers have small superficial ulcerations present.  Fingers have somewhat sluggish capillary refill throughout.  The hand is warm and well-perfused in general.  There is mild swelling of the left second finger.  No purulent drainage or erythema currently. Neurologic: Sensation grossly intact in extremities.  Symmetrical.  Speech is fluent. Motor exam as listed above. Psychiatric: Judgment intact, Mood & affect appropriate for pt's clinical situation. Dermatologic: Left hand wounds as described above.    Radiology DG Hand Complete Left  Result Date: 01/27/2023 CLINICAL DATA:  Gangrenous changes in the second and third digits, initial encounter EXAM: LEFT HAND - COMPLETE 3+ VIEW COMPARISON:  None Available. FINDINGS: No acute fracture or dislocation is noted. Some soft tissue loss is noted distally in the second digit consistent with  the given clinical history. No bony erosive changes are seen. IMPRESSION: Mild soft tissue loss in the distal aspect of the second digit consistent with the given clinical history. No acute bony abnormality is noted. Electronically Signed   By: Alcide Clever M.D.   On: 01/27/2023 17:53    Labs Recent Results (from the past 2160 hour(s))  CBC with Differential     Status: Abnormal   Collection Time: 01/27/23  5:08 PM  Result Value Ref Range   WBC 11.9 (H) 4.0 - 10.5 K/uL   RBC 4.32 4.22 - 5.81 MIL/uL   Hemoglobin 13.7 13.0 - 17.0 g/dL   HCT 65.7 84.6 - 96.2 %   MCV 95.8 80.0 - 100.0 fL  MCH 31.7 26.0 - 34.0 pg   MCHC 33.1 30.0 - 36.0 g/dL   RDW 16.1 09.6 - 04.5 %   Platelets 225 150 - 400 K/uL   nRBC 0.0 0.0 - 0.2 %   Neutrophils Relative % 65 %   Neutro Abs 7.8 (H) 1.7 - 7.7 K/uL   Lymphocytes Relative 27 %   Lymphs Abs 3.2 0.7 - 4.0 K/uL   Monocytes Relative 5 %   Monocytes Absolute 0.6 0.1 - 1.0 K/uL   Eosinophils Relative 2 %   Eosinophils Absolute 0.2 0.0 - 0.5 K/uL   Basophils Relative 1 %   Basophils Absolute 0.1 0.0 - 0.1 K/uL   Immature Granulocytes 0 %   Abs Immature Granulocytes 0.05 0.00 - 0.07 K/uL    Comment: Performed at [facility], [address] [place], [place] [address]  Lactic acid, plasma     Status: Abnormal   Collection Time: 01/27/23  5:08 PM  Result Value Ref Range   Lactic Acid, Venous 2.2 (HH) 0.5 - 1.9 mmol/L    Comment: CRITICAL RESULT CALLED TO, READ BACK BY AND VERIFIED WITH LES WILLOUGHBY 01/27/23 1829 MU Performed at [facility], [address] [place], [place] [address]   Comprehensive metabolic panel     Status: Abnormal   Collection Time: 01/27/23  7:50 PM  Result Value Ref Range   Sodium 140 135 - 145 mmol/L   Potassium 3.7 3.5 - 5.1 mmol/L   Chloride 108 98 - 111 mmol/L   CO2 24 22 - 32 mmol/L   Glucose, Bld 88 70 - 99 mg/dL    Comment: Glucose reference range applies only to samples taken after fasting for at  least 8 hours.   BUN 15 8 - 23 mg/dL   Creatinine, Ser 8.29 0.61 - 1.24 mg/dL   Calcium 8.2 (L) 8.9 - 10.3 mg/dL   Total Protein 6.8 6.5 - 8.1 g/dL   Albumin 3.7 3.5 - 5.0 g/dL   AST 21 15 - 41 U/L   ALT 28 0 - 44 U/L   Alkaline Phosphatase 68 38 - 126 U/L   Total Bilirubin 0.7 0.3 - 1.2 mg/dL   GFR, Estimated >56 >21 mL/min    Comment: (NOTE) Calculated using the CKD-EPI Creatinine Equation (2021)    Anion gap 8 5 - 15    Comment: Performed at [facility], [address] [place], [place] [address]  Culture, blood (single)     Status: None   Collection Time: 01/27/23  7:50 PM   Specimen: BLOOD  Result Value Ref Range   Specimen Description BLOOD BLOOD LEFT FOREARM    Special Requests      BOTTLES DRAWN AEROBIC AND ANAEROBIC Blood Culture adequate volume   Culture      NO GROWTH 5 DAYS Performed at [facility], [address], [place], [place] [address]    Report Status 02/01/2023 FINAL   Lactic acid, plasma     Status: None   Collection Time: 01/27/23  9:11 PM  Result Value Ref Range   Lactic Acid, Venous 0.9 0.5 - 1.9 mmol/L    Comment: Performed at [facility], [address], [place], [place] [address]    Assessment/Plan:  Gangrene of finger of left hand [facility]) Patient has gangrenous changes left index finger of his left hand as well as ulcerations on to other fingers.  He has a long and heavy history of tobacco use and I would be highly  concerned of Buerger's disease or small vessel disease of the arteries in the hand.  His hand itself appears well-perfused and his fingers do not.  He does have a strong radial pulse.  I think the best option for evaluating his perfusion particularly before he sees the hand surgeon next week will be with an angiogram of the left upper extremity.  I discussed the risks and benefits of the procedure.  I discussed that if large vessel disease is seen, we can certainly evaluate and consider for intervention but  for small vessel disease there may not be much that I have to offer.  I discussed smoking cessation being very important to try to augment her chance of wound healing.  He is clearly going to have at least digital loss of the tip of the left great index finger  Hypertension blood pressure control important in reducing the progression of atherosclerotic disease. On appropriate oral medications.   Tobacco abuse Has worked diligently on cutting back and we discussed that absolute smoking cessation is going to be critical for his hand and wound healing.      [name] 02/03/2023, 3:53 PM   This note was created with Dragon medical transcription system.  Any errors from dictation are unintentional.

## 2023-02-03 NOTE — Assessment & Plan Note (Signed)
Patient has gangrenous changes left index finger of his left hand as well as ulcerations on to other fingers.  He has a long and heavy history of tobacco use and I would be highly concerned of Buerger's disease or small vessel disease of the arteries in the hand.  His hand itself appears well-perfused and his fingers do not.  He does have a strong radial pulse.  I think the best option for evaluating his perfusion particularly before he sees the hand surgeon next week will be with an angiogram of the left upper extremity.  I discussed the risks and benefits of the procedure.  I discussed that if large vessel disease is seen, we can certainly evaluate and consider for intervention but for small vessel disease there may not be much that I have to offer.  I discussed smoking cessation being very important to try to augment her chance of wound healing.  He is clearly going to have at least digital loss of the tip of the left great index finger

## 2023-02-05 ENCOUNTER — Telehealth (INDEPENDENT_AMBULATORY_CARE_PROVIDER_SITE_OTHER): Payer: Self-pay

## 2023-02-05 NOTE — Telephone Encounter (Signed)
I attempted to contact the spouse as requested regarding the patient and a message was left for a return call.

## 2023-02-06 IMAGING — US US RENAL
1 series · 14 of 25 positions shown · non-contrast
Comparison: None.

CLINICAL DATA: Elevated serum creatinine.

EXAM:
RENAL / URINARY TRACT ULTRASOUND COMPLETE

[Series 1: us renal · 14 of 39 slices shown]
[im 1/39]
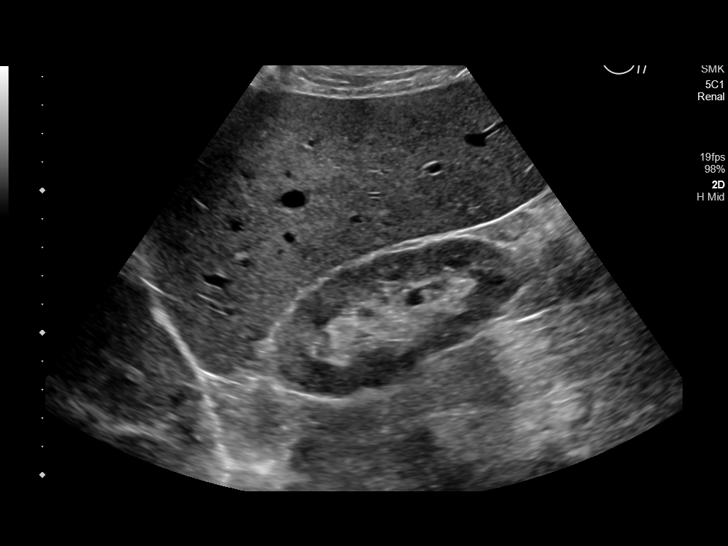
[im 4/39]
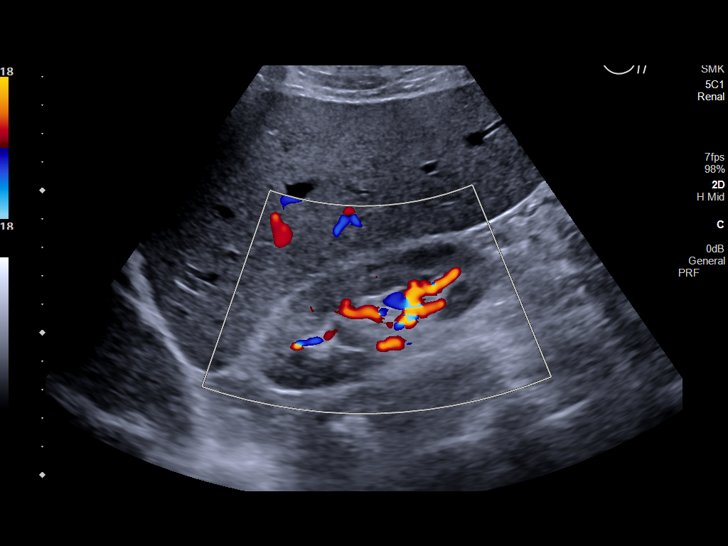
[im 7/39]
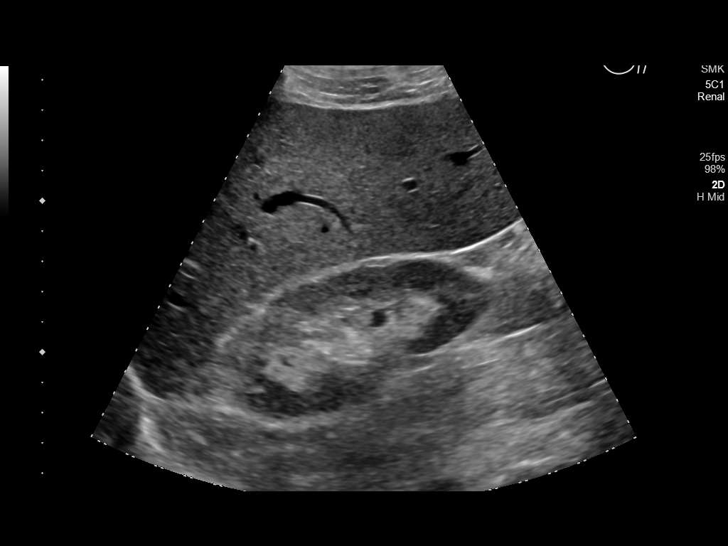
[im 10/39]
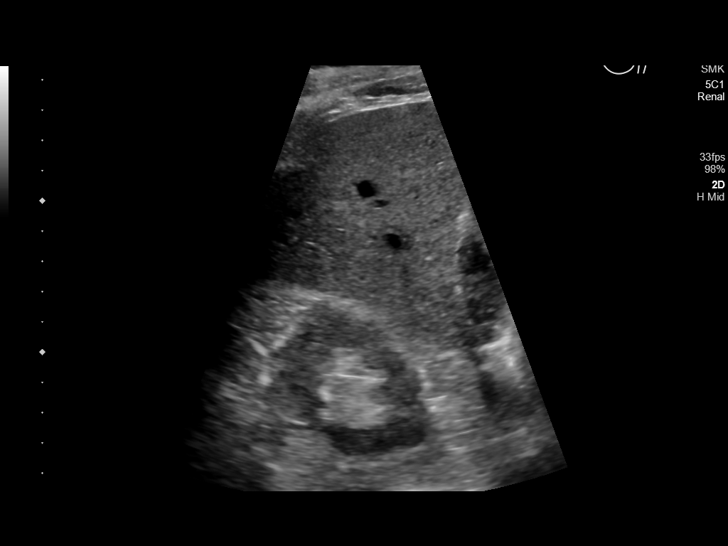
[im 13/39]
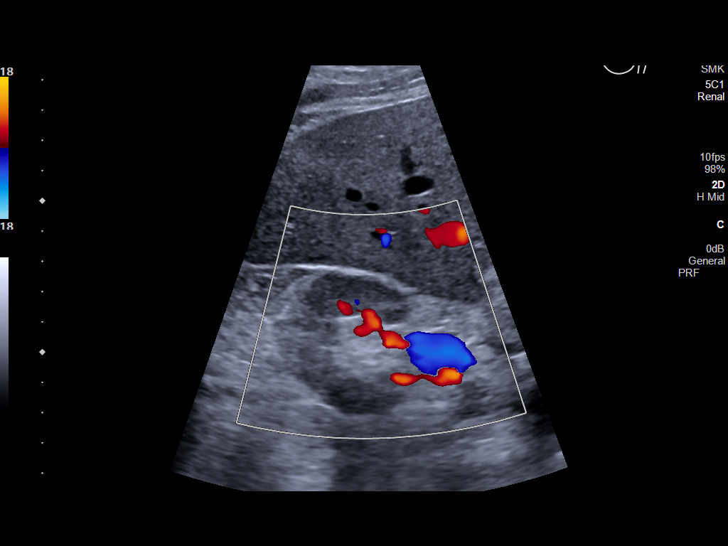
[im 15/39]
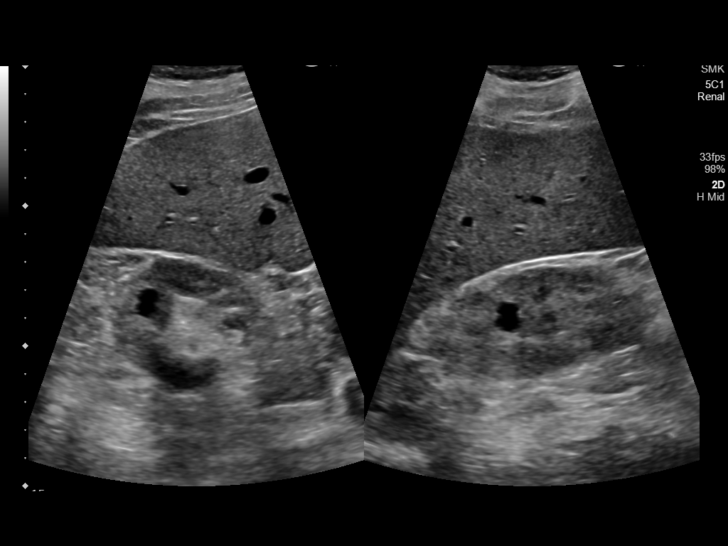
[im 18/39]
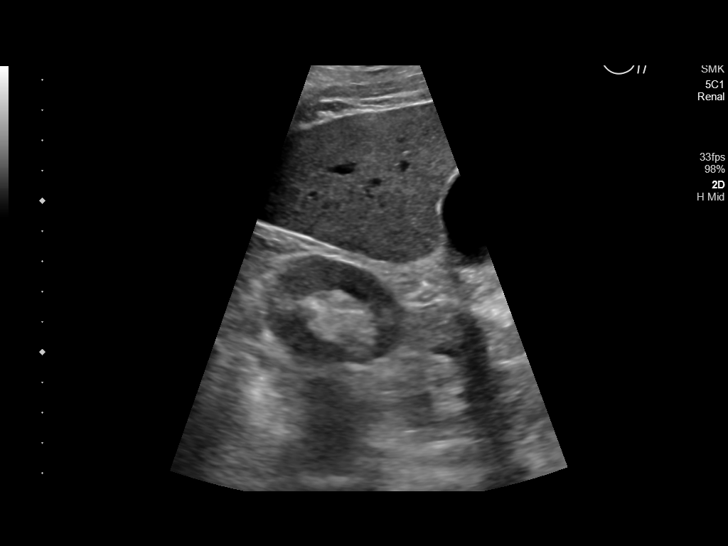
[im 21/39]
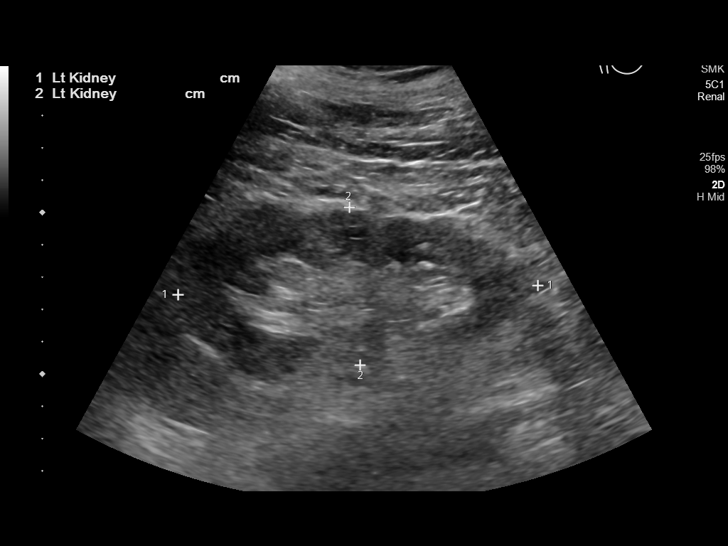
[im 24/39]
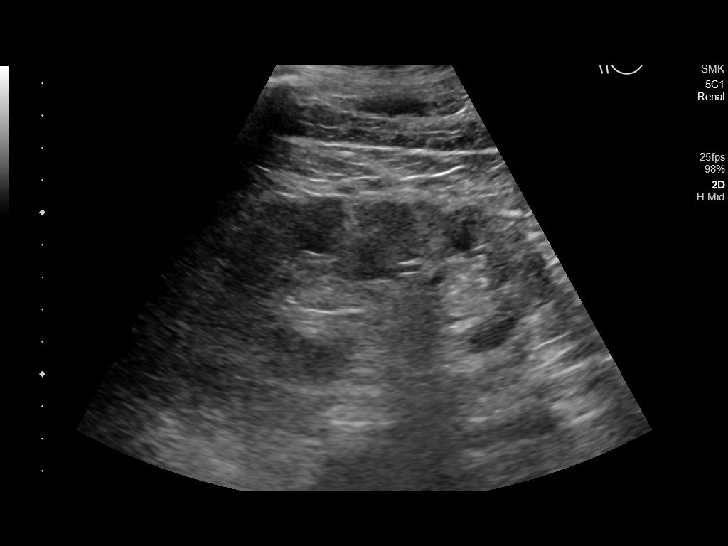
[im 26/39]
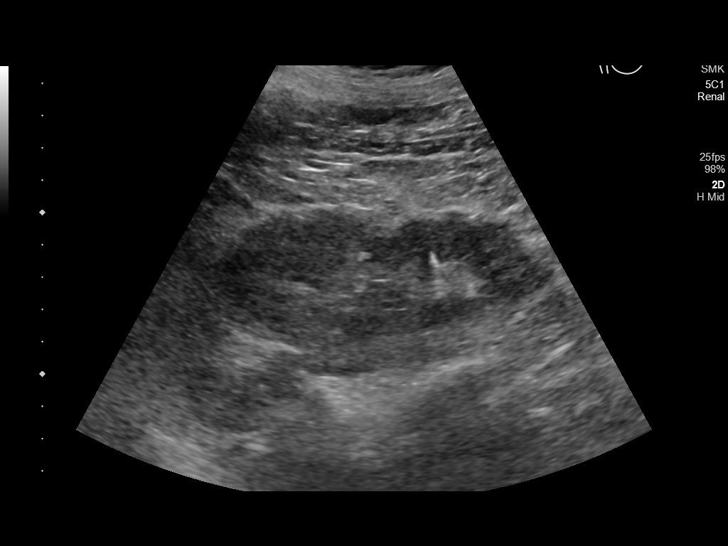
[im 29/39]
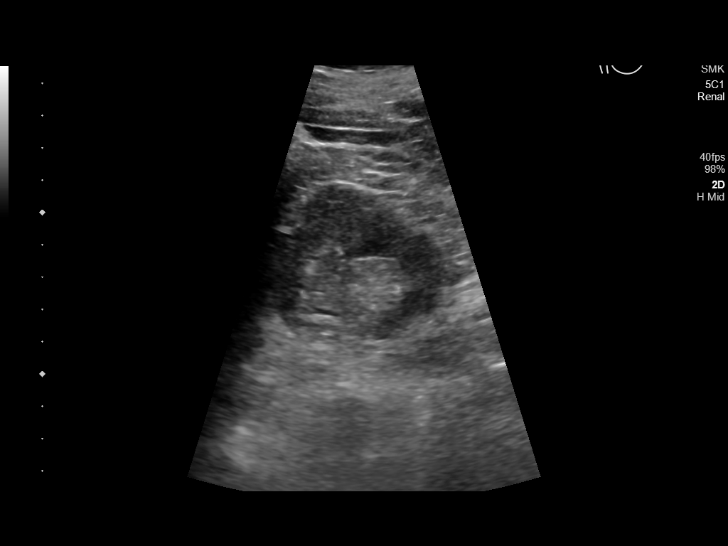
[im 32/39]
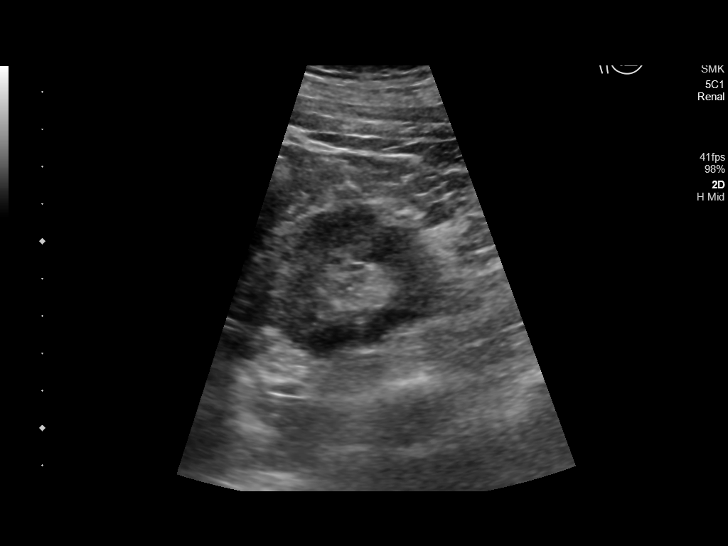
[im 35/39]
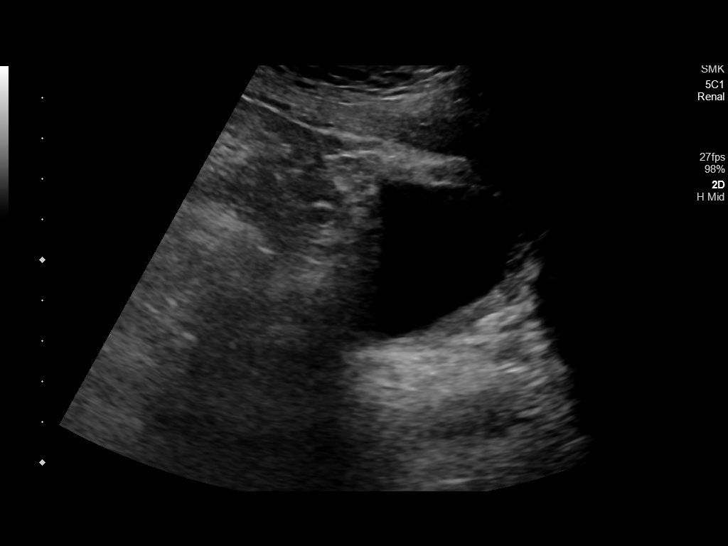
[im 39/39]
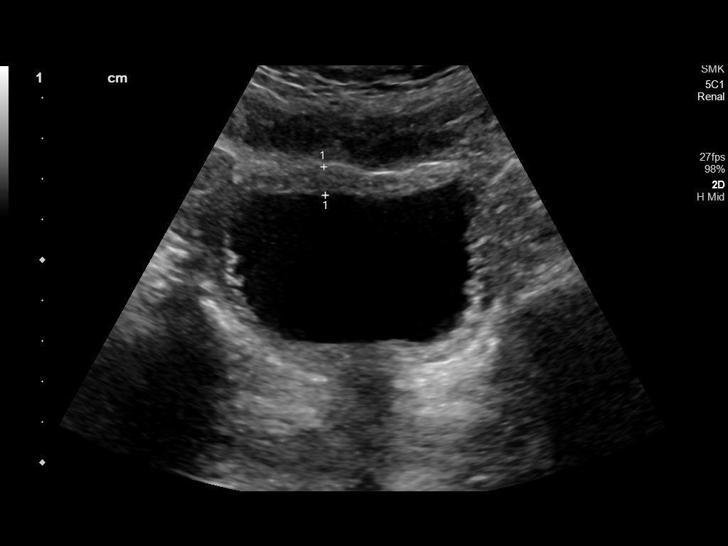

[14 of 25 positions shown; findings below may reference images not displayed]

FINDINGS: Right Kidney:

Renal measurements: 9.6 x 4.5 x 4.4 cm = volume: 99 mL. No
hydronephrosis. Borderline increased parenchymal echogenicity.
cm cyst in the mid kidney. No solid lesion. No visualized stone.

Left Kidney:

Renal measurements: 11.1 x 4.9 x 4.6 cm = volume: 131 mL. No
hydronephrosis. Borderline increased parenchymal echogenicity. No
visualized stone or focal lesion.

Bladder:

Only partially distended. There is bladder wall thickening of 7 mm
at the dome, no focal bladder wall abnormality.

Other:

None.
IMPRESSION: 1. Borderline increased parenchymal echogenicity suggesting chronic
medical renal disease. No obstructive uropathy.
2. Small simple cyst in the right kidney.
3. Possible bladder wall thickening, accentuated by incomplete
distention.

## 2023-02-10 ENCOUNTER — Telehealth (INDEPENDENT_AMBULATORY_CARE_PROVIDER_SITE_OTHER): Payer: Self-pay

## 2023-02-10 NOTE — Telephone Encounter (Signed)
Spoke with the patient and he will be scheduled with Dr. Wyn Quaker on 02/13/23 with a 12:00 pm arrival time to the [facility] for a left arm angio. Pre-procedure instruction were discussed and will be sent to Mychart. Patient was initially scheduled for 02/12/23 and I had to reschedule as that time frame was taken. I attempted to contact the patient to give him the rescheduled date and time frame. A message was left for a return call. Spoke with the patient's spouse and all information was given to her and sent to Mychart as well. I did have to correct the time from 11:00 pm to 11:00 am.

## 2023-02-13 ENCOUNTER — Encounter: Payer: Self-pay | Admitting: Vascular Surgery

## 2023-02-13 ENCOUNTER — Other Ambulatory Visit: Payer: Self-pay

## 2023-02-13 ENCOUNTER — Ambulatory Visit
Admission: RE | Admit: 2023-02-13 | Discharge: 2023-02-13 | Disposition: A | Discharge status: Home / Self Care | Source: Ambulatory Visit | Attending: Vascular Surgery | Admitting: Vascular Surgery

## 2023-02-13 ENCOUNTER — Encounter
Admission: RE | Disposition: A | Discharge status: Home / Self Care | Payer: Self-pay | Source: Ambulatory Visit | Attending: Vascular Surgery

## 2023-02-13 DIAGNOSIS — I96 Gangrene, not elsewhere classified: Secondary | ICD-10-CM | POA: Diagnosis present

## 2023-02-13 DIAGNOSIS — I708 Atherosclerosis of other arteries: Secondary | ICD-10-CM | POA: Diagnosis not present

## 2023-02-13 DIAGNOSIS — F17210 Nicotine dependence, cigarettes, uncomplicated: Secondary | ICD-10-CM | POA: Diagnosis not present

## 2023-02-13 DIAGNOSIS — S6992XA Unspecified injury of left wrist, hand and finger(s), initial encounter: Secondary | ICD-10-CM

## 2023-02-13 DIAGNOSIS — I10 Essential (primary) hypertension: Secondary | ICD-10-CM | POA: Diagnosis not present

## 2023-02-13 DIAGNOSIS — L98499 Non-pressure chronic ulcer of skin of other sites with unspecified severity: Secondary | ICD-10-CM | POA: Diagnosis not present

## 2023-02-13 DIAGNOSIS — I70268 Atherosclerosis of native arteries of extremities with gangrene, other extremity: Secondary | ICD-10-CM | POA: Diagnosis not present

## 2023-02-13 HISTORY — PX: UPPER EXTREMITY ANGIOGRAPHY: CATH118270

## 2023-02-13 SURGERY — UPPER EXTREMITY ANGIOGRAPHY
Anesthesia: Moderate Sedation | Laterality: Left

## 2023-02-13 MED ORDER — CEFAZOLIN SODIUM-DEXTROSE 2-4 GM/100ML-% IV SOLN
2.0000 g | INTRAVENOUS | Status: AC
  Administered 2023-02-13: 2 g via INTRAVENOUS

## 2023-02-13 MED ORDER — SODIUM CHLORIDE 0.9 % IV SOLN: INTRAVENOUS | Status: DC

## 2023-02-13 MED ORDER — FENTANYL CITRATE (PF) 100 MCG/2ML IJ SOLN
INTRAMUSCULAR | Status: AC
  Filled 2023-02-13: qty 2

## 2023-02-13 MED ORDER — ATORVASTATIN CALCIUM 10 MG PO TABS: 10.0000 mg | ORAL_TABLET | Freq: Every day | ORAL | 11 refills | Status: DC

## 2023-02-13 MED ORDER — FENTANYL CITRATE (PF) 100 MCG/2ML IJ SOLN
INTRAMUSCULAR | Status: DC | PRN
  Administered 2023-02-13: 50 ug via INTRAVENOUS

## 2023-02-13 MED ORDER — CEFAZOLIN SODIUM-DEXTROSE 2-4 GM/100ML-% IV SOLN
INTRAVENOUS | Status: AC
  Filled 2023-02-13: qty 100

## 2023-02-13 MED ORDER — HEPARIN SODIUM (PORCINE) 1000 UNIT/ML IJ SOLN
INTRAMUSCULAR | Status: AC
  Filled 2023-02-13: qty 10

## 2023-02-13 MED ORDER — FAMOTIDINE 20 MG PO TABS: 40.0000 mg | ORAL_TABLET | Freq: Once | ORAL | Status: DC | PRN

## 2023-02-13 MED ORDER — CLOPIDOGREL BISULFATE 75 MG PO TABS: 75.0000 mg | ORAL_TABLET | Freq: Every day | ORAL | 11 refills | Status: DC

## 2023-02-13 MED ORDER — ASPIRIN 81 MG PO TBEC: 81.0000 mg | DELAYED_RELEASE_TABLET | Freq: Every day | ORAL | 2 refills | Status: DC

## 2023-02-13 MED ORDER — HYDRALAZINE HCL 20 MG/ML IJ SOLN
INTRAMUSCULAR | Status: DC | PRN
  Administered 2023-02-13: 20 mg via INTRAVENOUS

## 2023-02-13 MED ORDER — MIDAZOLAM HCL 2 MG/2ML IJ SOLN
INTRAMUSCULAR | Status: DC | PRN
  Administered 2023-02-13: 2 mg via INTRAVENOUS

## 2023-02-13 MED ORDER — DIPHENHYDRAMINE HCL 50 MG/ML IJ SOLN: 50.0000 mg | Freq: Once | INTRAMUSCULAR | Status: DC | PRN

## 2023-02-13 MED ORDER — IODIXANOL 320 MG/ML IV SOLN
INTRAVENOUS | Status: DC | PRN
  Administered 2023-02-13: 85 mL

## 2023-02-13 MED ORDER — ONDANSETRON HCL 4 MG/2ML IJ SOLN: 4.0000 mg | Freq: Four times a day (QID) | INTRAMUSCULAR | Status: DC | PRN

## 2023-02-13 MED ORDER — HEPARIN SODIUM (PORCINE) 1000 UNIT/ML IJ SOLN
INTRAMUSCULAR | Status: DC | PRN
  Administered 2023-02-13: 5000 [IU] via INTRAVENOUS

## 2023-02-13 MED ORDER — HYDROMORPHONE HCL 1 MG/ML IJ SOLN: 1.0000 mg | Freq: Once | INTRAMUSCULAR | Status: DC | PRN

## 2023-02-13 MED ORDER — METHYLPREDNISOLONE SODIUM SUCC 125 MG IJ SOLR: 125.0000 mg | Freq: Once | INTRAMUSCULAR | Status: DC | PRN

## 2023-02-13 MED ORDER — MIDAZOLAM HCL 2 MG/ML PO SYRP: 8.0000 mg | ORAL_SOLUTION | Freq: Once | ORAL | Status: DC | PRN

## 2023-02-13 MED ORDER — MIDAZOLAM HCL 5 MG/5ML IJ SOLN
INTRAMUSCULAR | Status: AC
  Filled 2023-02-13: qty 5

## 2023-02-13 MED ORDER — HYDRALAZINE HCL 20 MG/ML IJ SOLN
INTRAMUSCULAR | Status: AC
  Filled 2023-02-13: qty 1

## 2023-02-13 SURGICAL SUPPLY — 19 items
BALLN LUTONIX 7X80X130 (BALLOONS) ×1
BALLOON LUTONIX 7X80X130 (BALLOONS) IMPLANT
CATH ANGIO 5F PIGTAIL 100CM (CATHETERS) IMPLANT
CATH BEACON 5 .035 100 H1 TIP (CATHETERS) IMPLANT
COVER PROBE ULTRASOUND 5X96 (MISCELLANEOUS) IMPLANT
DEVICE STARCLOSE SE CLOSURE (Vascular Products) IMPLANT
DEVICE TORQUE (MISCELLANEOUS) IMPLANT
GLIDEWIRE ANGLED SS 035X260CM (WIRE) IMPLANT
KIT ENCORE 26 ADVANTAGE (KITS) IMPLANT
PACK ANGIOGRAPHY (CUSTOM PROCEDURE TRAY) IMPLANT
SHEATH BRITE TIP 5FRX11 (SHEATH) IMPLANT
SHEATH RAABE 7FR (SHEATH) IMPLANT
SHEATH SHUTTLE 7FR (SHEATH) IMPLANT
STENT LIFESTREAM 8X37X80 (Permanent Stent) IMPLANT
STENT LIFESTREAM 9X38X80 (Permanent Stent) IMPLANT
SYR MEDRAD MARK 7 150ML (SYRINGE) IMPLANT
TUBING CONTRAST HIGH PRESS 72 (TUBING) IMPLANT
WIRE GUIDERIGHT .035X150 (WIRE) IMPLANT
WIRE SUPRACORE 300CM (WIRE) IMPLANT

## 2023-02-13 NOTE — Interval H&P Note (Signed)
History and Physical Interval Note:  02/13/2023 11:15 AM  Brad Chen  has presented today for surgery, with the diagnosis of L Arm Angio   Gangrene L finger.  The various methods of treatment have been discussed with the patient and family. After consideration of risks, benefits and other options for treatment, the patient has consented to  Procedure(s): Upper Extremity Angiography (Left) as a surgical intervention.  The patient's history has been reviewed, patient examined, no change in status, stable for surgery.  I have reviewed the patient's chart and labs.  Questions were answered to the patient's satisfaction.     [name]

## 2023-02-13 NOTE — Op Note (Signed)
OPERATIVE REPORT     PREOPERATIVE DIAGNOSIS: 1. Gangrene left fingers   POSTOPERATIVE DIAGNOSIS: Same as above   PROCEDURE PERFORMED: 1. Ultrasound guidance vascular access to right femoral artery. 2. Catheter placement to left brachial artery     from right femoral approach. 3. Thoracic aortogram and selective left upper extremity angiogram 4.  Balloon angioplasty of the left subclavian/axillary artery distal to the vertebral artery with 7 mm diameter Lutonix drug-coated angioplasty balloon 5.  Stent placement to the left subclavian/axillary artery distal to the vertebral artery with 8 mm diam by 37 mm length Lifestream stent 6.  Stent placement to the left subclavian artery origin with 9 mm diameter by 37 mm length Lifestream stent 7. StarClose closure device right femoral artery.   SURGEON:  Annice Needy, MD   ANESTHESIA:  Local with moderate conscious sedation for 33 minutes using 2 mg of Versed and 50 mcg of Fentanyl   BLOOD LOSS:  Minimal.   FLUOROSCOPY TIME:  4.6 minutes   INDICATION FOR PROCEDURE:  This is a 62 y.o.male who presented to our office with gangrenous changes to the fingers on the left hand.  This happened after a trauma to the hand and has progressively worsened to clear gangrenous changes.  To further evaluate this to determine what options would be possible to treat the ischemia, angiogram of the left upper extremity is indicated.  Risks and benefits are discussed.  Informed consent was obtained.   DESCRIPTION OF PROCEDURE:  The patient was brought to the vascular suite.  Moderate conscious sedation was administered during a face to face encounter with the patient throughout the procedure with my supervision of the RN administering medicines and monitoring the patient's vital signs, pulse oximetry, telemetry and mental status throughout from the start of the procedure until the patient was taken to the recovery room.  Groins were shaved and prepped and sterile  surgical field was created.  The right femoral head was localized with fluoroscopy and the right femoral artery was then visualized with ultrasound and found to be widely patent.  It was then accessed under direct ultrasound guidance without difficulty with a Seldinger needle and a permanent image was recorded.  A J-wire and 5-French sheath were then placed.  Pigtail catheter was placed into the ascending aorta and a thoracic aortogram was then performed in the LAO projection. This demonstrated normal origins to the great vessels without significant proximal stenoses to the innominate artery or left common carotid artery but I highly calcific origin stenosis of the left subclavian artery in the 60 to 70% range.  There was a normal configuration of the great vessels.  The patient was given 5000 units of intravenous heparin and a headhunter catheter was used to selectively cannulate the left subclavian artery without difficulty.  Imaging was then performed.  The vertebral artery and mammary artery are widely patent.  Then there was a separate distinct lesion in the left subclavian artery distal to the vertebral artery tracking into the axillary artery over a couple of centimeters creating another area of 70 to 80% stenosis.  I then advanced the headhunter catheter down to the left axillary and brachial artery.  There were 2 areas of nonflow limiting stenosis in the brachial artery.  The radial artery was large and dominant and he did have a complete palmar arch.  The ulnar artery was very small and appeared to occlude at the level of the wrist.  Although there was not much from an endovascular  point we could do for his small vessel disease in the hand, we could treat the 2 proximal stenosis to try to improve his left upper extremity arterial perfusion is much as possible.  I upsized to a long 7 [place] sheath.  The lesion distal to the vertebral artery and the more distal subclavian and proximal axillary artery  was addressed initially with a 7 mm diameter by 6 cm length Lutonix drug-coated angioplasty balloon inflated to 12 atm for 1 minute.  There were actually 2 lesions in both responded with resolution of the waist with angioplasty.  The more proximal lesion however had a residual greater than 50% residual stenosis.  The more distal lesion had only about a 30% residual stenosis.  I then elected to place an 8 mm diameter by 37 mm length Lifestream stent to the more proximal portion just distal to the vertebral artery.  This is inflated to 12 atm and then postdilated with a 9 mm balloon.  Completion imaging showed less than 10% residual stenosis in the more distal lesion in the distal subclavian and proximal axillary artery.  I then turned my attention to the origin stenosis.  A 9 mm diameter by 37 mm length Lifestream stent was selected and deployed and inflated to 14 atm at the origin of the subclavian artery well proximal to the vertebral artery.  Completion imaging showed less than 10% residual stenosis in this location after stent placement.  I elected to terminate the procedure.  Oblique arteriogram was performed of the right femoral artery and StarClose closure device deployed in the usual fashion with excellent hemostatic result. The patient tolerated the procedure well and was taken to the recovery room in stable condition.    [name] 02/13/2023 5:06 PM

## 2023-02-14 ENCOUNTER — Emergency Department
Admission: EM | Admit: 2023-02-14 | Discharge: 2023-02-14 | Disposition: A | Discharge status: Home / Self Care | Attending: Emergency Medicine | Admitting: Emergency Medicine

## 2023-02-14 ENCOUNTER — Other Ambulatory Visit: Payer: Self-pay

## 2023-02-14 ENCOUNTER — Emergency Department

## 2023-02-14 DIAGNOSIS — X58XXXA Exposure to other specified factors, initial encounter: Secondary | ICD-10-CM | POA: Insufficient documentation

## 2023-02-14 DIAGNOSIS — S301XXA Contusion of abdominal wall, initial encounter: Secondary | ICD-10-CM | POA: Diagnosis not present

## 2023-02-14 DIAGNOSIS — Z955 Presence of coronary angioplasty implant and graft: Secondary | ICD-10-CM | POA: Diagnosis not present

## 2023-02-14 DIAGNOSIS — R58 Hemorrhage, not elsewhere classified: Secondary | ICD-10-CM | POA: Diagnosis present

## 2023-02-14 NOTE — Discharge Instructions (Signed)
If you notice bleeding through the bandage again, apply direct, firm pressure and remain still for 10 minutes  No running or significant physical activity for the next 48 hours  CONTINUE your medications including your Plavix  Follow-up with Vascular Surgery in the office as scheduled

## 2023-02-14 NOTE — ED Provider Notes (Signed)
[facility] Provider Note    Event Date/Time   First MD Initiated Contact with Patient 02/14/23 1356     (approximate)   History   Bleeding from angiogram site   HPI  BRONXTON ALDOUS is a 62 y.o. male  here with bleeding from R angiogram site. Pt underwent revascularization and stent placement of LUE with Dr. Wyn Quaker yesterday - tolerated well. Access was via R femoral artery. Since then, he had some small bleeding of his site last night but this persisted and worsened throughout the day so wife called [facility] who brought him in for evaluation. Denies any major pain. No new numbness or weakness. No other complaints.      Physical Exam   Triage Vital Signs: ED Triage Vitals  Enc Vitals Group     BP 02/14/23 1221 111/75     Pulse Rate 02/14/23 1221 92     Resp 02/14/23 1221 16     Temp 02/14/23 1221 98.6 F (37 C)     Temp Source 02/14/23 1221 Oral     SpO2 02/14/23 1221 94 %     Weight --      Height --      Head Circumference --      Peak Flow --      Pain Score 02/14/23 1223 0     Pain Loc --      Pain Edu? --      Excl. in GC? --     Most recent vital signs: Vitals:   02/14/23 1221  BP: 111/75  Pulse: 92  Resp: 16  Temp: 98.6 F (37 C)  SpO2: 94%     General: Awake, no distress.  CV:  Good peripheral perfusion. RRR. Resp:  Normal work of breathing.  Abd:  No distention.  Other:  2+ DP/PT pulses bilateral LE. Toes warm, well perfused. 2+ radial pulses b/l. Right femoral access site with small amount of ecchymoses but no active bleeding, no large hematoma, and dressing is not overtly bloody.   ED Results / Procedures / Treatments   Labs (all labs ordered are listed, but only abnormal results are displayed) Labs Reviewed - No data to display   EKG    RADIOLOGY [place] Art RLE: Small hematoma at access site, no pseudoaneurysm   I also independently reviewed and agree with radiologist  interpretations.   PROCEDURES:  Critical Care performed: No   MEDICATIONS ORDERED IN ED: Medications - No data to display   IMPRESSION / MDM / ASSESSMENT AND PLAN / ED COURSE  I reviewed the triage vital signs and the nursing notes.                              Differential diagnosis includes, but is not limited to, access site hematoma, pseudoaneurysm, bleeding from arterial puncture site, coagulopathy  Patient's presentation is most consistent with acute presentation with potential threat to life or bodily function.  62 yo M with PMHx as above here with bleeding from R inguinal site after recent vascular surgery intervention. Pt has small hematoma on imaging and no active bleeding here. He is HDS. No significant blood loss noted. No evidence of pseudoaneurysm. Distal NV is intact and toes warm, well perfused.  Suspect mild access site hematoma, resolved. Discussed with Dr. Mike Gip, will f/u in clinic. He will continue his plavix in setting of recent stenting.     FINAL CLINICAL IMPRESSION(S) /  ED DIAGNOSES   Final diagnoses:  Hematoma of right inguinal region     Rx / DC Orders   ED Discharge Orders     None        Note:  This document was prepared using Dragon voice recognition software and may include unintentional dictation errors.   Shaune Pollack, MD 02/14/23 2005

## 2023-02-14 NOTE — ED Triage Notes (Signed)
Pt presents via POV c/o bleeding from right groin. Reports s/p angiogram with stent placement yesterday. Denies pain but is tender to palpation at site. No knots felts at site. Bleeding noted to gauze at right groin site.

## 2023-02-14 NOTE — ED Notes (Signed)
Pt showing angio site where bleeding is. New guaze dry, bleeding controlled at this time.

## 2023-02-16 ENCOUNTER — Encounter: Payer: Self-pay | Admitting: Vascular Surgery

## 2023-03-05 ENCOUNTER — Other Ambulatory Visit: Payer: Self-pay | Admitting: Otolaryngology

## 2023-03-05 DIAGNOSIS — C4492 Squamous cell carcinoma of skin, unspecified: Secondary | ICD-10-CM

## 2023-03-09 ENCOUNTER — Other Ambulatory Visit (INDEPENDENT_AMBULATORY_CARE_PROVIDER_SITE_OTHER): Payer: Self-pay | Admitting: Vascular Surgery

## 2023-03-09 DIAGNOSIS — Z95820 Peripheral vascular angioplasty status with implants and grafts: Secondary | ICD-10-CM

## 2023-03-09 DIAGNOSIS — I771 Stricture of artery: Secondary | ICD-10-CM

## 2023-03-12 ENCOUNTER — Encounter (INDEPENDENT_AMBULATORY_CARE_PROVIDER_SITE_OTHER): Payer: Self-pay | Admitting: Vascular Surgery

## 2023-03-12 ENCOUNTER — Encounter (INDEPENDENT_AMBULATORY_CARE_PROVIDER_SITE_OTHER): Payer: Self-pay | Admitting: Nurse Practitioner

## 2023-03-12 ENCOUNTER — Ambulatory Visit (INDEPENDENT_AMBULATORY_CARE_PROVIDER_SITE_OTHER): Admitting: Nurse Practitioner

## 2023-03-12 ENCOUNTER — Ambulatory Visit (INDEPENDENT_AMBULATORY_CARE_PROVIDER_SITE_OTHER)

## 2023-03-12 VITALS — BP 166/86 | HR 75 | Resp 18 | Ht 68.0 in | Wt 158.0 lb

## 2023-03-12 DIAGNOSIS — I10 Essential (primary) hypertension: Secondary | ICD-10-CM | POA: Diagnosis not present

## 2023-03-12 DIAGNOSIS — I2510 Atherosclerotic heart disease of native coronary artery without angina pectoris: Secondary | ICD-10-CM

## 2023-03-12 DIAGNOSIS — I771 Stricture of artery: Secondary | ICD-10-CM

## 2023-03-12 DIAGNOSIS — I96 Gangrene, not elsewhere classified: Secondary | ICD-10-CM | POA: Diagnosis not present

## 2023-03-12 DIAGNOSIS — Z95820 Peripheral vascular angioplasty status with implants and grafts: Secondary | ICD-10-CM

## 2023-03-12 NOTE — Progress Notes (Signed)
Subjective:    Patient ID: Brad Chen, male    DOB: [birth year], 62 y.o.   MRN: [id number] Chief Complaint  Patient presents with   Venous Insufficiency    Brad Chen is a 62 year old male who returns today for follow-up evaluation after intervention on 02/13/2023.  The patient had intervention of the left subclavian artery region as well as the subclavian artery/axillary artery with stent placement.  The patient initially follow-up with [place] due to gangrenous changes of his left index finger with some small wounds on the third and fifth finger.  Today the wounds on the third and fifth finger completely healed.  The patient underwent intervention to his index finger and today it is healing very well postintervention and nearly healed.  The patient did have some bleeding postprocedure and had some significant bruising in the upper extremity but it is beginning to resolve.  It was noted during angiogram that the patient had a widely patent radial artery but had an occlusion near the ulnar artery.  He had adequate filling of his palmer arch  Today noninvasive studies show antegrade flow throughout the left upper extremity.  The patient has biphasic waveforms throughout the left upper extremity with no area of significant stenosis.  The previously placed stents are patent.    Review of Systems  Skin:  Positive for wound.  Hematological:  Bruises/bleeds easily.  All other systems reviewed and are negative.      Objective:   Physical Exam Vitals reviewed.  HENT:     Head: Normocephalic.  Cardiovascular:     Rate and Rhythm: Normal rate.     Pulses:          Radial pulses are 1+ on the left side.  Pulmonary:     Effort: Pulmonary effort is normal.  Skin:    General: Skin is warm and dry.  Neurological:     Mental Status: He is alert and oriented to person, place, and time.  Psychiatric:        Mood and Affect: Mood normal.        Behavior: Behavior normal.        Thought  Content: Thought content normal.        Judgment: Judgment normal.     BP (!) 166/86 (BP Location: Right Arm)   Pulse 75   Resp 18   Ht 5\' 8"  (1.727 m)   Wt 158 lb (71.7 kg)   BMI 24.02 kg/m   Past Medical History:  Diagnosis Date   Hypertension     Social History   Socioeconomic History   Marital status: Married    Spouse name: Not on file   Number of children: Not on file   Years of education: Not on file   Highest education level: Not on file  Occupational History   Not on file  Tobacco Use   Smoking status: Former    Packs/day: 1    Types: Cigarettes   Smokeless tobacco: Never  Vaping Use   Vaping Use: Never used  Substance and Sexual Activity   Alcohol use: Not Currently    Comment: pt denies drinking but etoh 3.0 on scene   Drug use: Never   Sexual activity: Not on file  Other Topics Concern   Not on file  Social History Narrative   Not on file   Social Determinants of Health   Financial Resource Strain: Not on file  Food Insecurity: Not on file  Transportation Needs: Not on  file  Physical Activity: Not on file  Stress: Not on file  Social Connections: Not on file  Intimate Partner Violence: Not on file    Past Surgical History:  Procedure Laterality Date   CARPAL TUNNEL RELEASE Bilateral    NECK SURGERY  2003   discectomy   SHOULDER ARTHROSCOPY WITH SUBACROMIAL DECOMPRESSION AND OPEN ROTATOR C Left 07/22/2022   Procedure: Left shoulder arthroscopic subscapularis repair, mini open rotator cuff repair vs reconstruction with allograft, subacromial decompression, distal clavicle excision, and biceps tenodesis;  Surgeon: Signa Kell, MD;  Location: [facility] ORS;  Service: Orthopedics;  Laterality: Left;   UPPER EXTREMITY ANGIOGRAPHY Left 02/13/2023   Procedure: Upper Extremity Angiography;  Surgeon: Annice Needy, MD;  Location: [facility] INVASIVE CV LAB;  Service: Cardiovascular;  Laterality: Left;    Family History  Problem Relation Age of Onset    Hypertension Mother     No Known Allergies     Latest Ref Rng & Units 01/27/2023    5:08 PM 01/26/2021    5:32 PM 02/19/2020    1:29 PM  CBC  WBC 4.0 - 10.5 K/uL 11.9  10.8  6.7   Hemoglobin 13.0 - 17.0 g/dL 54.0  98.1  19.1   Hematocrit 39.0 - 52.0 % 41.4  38.0  32.3   Platelets 150 - 400 K/uL 225  143  313       CMP     Component Value Date/Time   NA 140 01/27/2023 1950   K 3.7 01/27/2023 1950   CL 108 01/27/2023 1950   CO2 24 01/27/2023 1950   GLUCOSE 88 01/27/2023 1950   BUN 15 01/27/2023 1950   CREATININE 0.99 01/27/2023 1950   CALCIUM 8.2 (L) 01/27/2023 1950   PROT 6.8 01/27/2023 1950   ALBUMIN 3.7 01/27/2023 1950   AST 21 01/27/2023 1950   ALT 28 01/27/2023 1950   ALKPHOS 68 01/27/2023 1950   BILITOT 0.7 01/27/2023 1950   GFRNONAA >60 01/27/2023 1950     No results found.     Assessment & Plan:   1. Gangrene of finger of left hand (HCC) Healing very well post surgical removal wound of gangrene following revascularization.  2. Subclavian artery stenosis (HCC) Noninvasive studies show patency of the subclavian artery.  Stents are also currently patent.  There is understandably some concern about possible carotid disease.  Will have the patient return at their convenience for carotid artery duplex for evaluation.  Will plan on reevaluating the left upper extremity subclavian stent in 3 months.  3. Primary hypertension Continue antihypertensive medications as already ordered, these medications have been reviewed and there are no changes at this time.  4. CAD in native artery The patient had a CT scan done on 02/25/2023 which shows extensive coronary calcifications.  The patient and wife are concerned and in order for further evaluation, we will refer to cardiology.   Current Outpatient Medications on File Prior to Visit  Medication Sig Dispense Refill   acetaminophen (TYLENOL) 500 MG tablet Take 2 tablets (1,000 mg total) by mouth every 8 (eight) hours. 90  tablet 2   aspirin EC 81 MG tablet Take 1 tablet (81 mg total) by mouth daily. Swallow whole. 150 tablet 2   atorvastatin (LIPITOR) 10 MG tablet Take 1 tablet (10 mg total) by mouth daily. 30 tablet 11   clopidogrel (PLAVIX) 75 MG tablet Take 1 tablet (75 mg total) by mouth daily. 30 tablet 11   No current facility-administered medications on file prior  to visit.    There are no Patient Instructions on file for this visit. No follow-ups on file.   Georgiana Spinner, NP

## 2023-03-17 ENCOUNTER — Ambulatory Visit
Admission: RE | Admit: 2023-03-17 | Discharge: 2023-03-17 | Disposition: A | Discharge status: Home / Self Care | Source: Ambulatory Visit | Attending: Otolaryngology | Admitting: Otolaryngology

## 2023-03-17 DIAGNOSIS — C4492 Squamous cell carcinoma of skin, unspecified: Secondary | ICD-10-CM | POA: Insufficient documentation

## 2023-03-17 DIAGNOSIS — E041 Nontoxic single thyroid nodule: Secondary | ICD-10-CM

## 2023-03-17 HISTORY — DX: Nontoxic single thyroid nodule: E04.1

## 2023-03-17 MED ORDER — SODIUM CHLORIDE 0.9 % IV SOLN: INTRAVENOUS | Status: DC

## 2023-03-17 MED ORDER — IOHEXOL 300 MG/ML  SOLN
75.0000 mL | Freq: Once | INTRAMUSCULAR | Status: AC | PRN
  Administered 2023-03-17: 75 mL via INTRAVENOUS

## 2023-03-25 DIAGNOSIS — E041 Nontoxic single thyroid nodule: Secondary | ICD-10-CM

## 2023-03-25 HISTORY — DX: Nontoxic single thyroid nodule: E04.1

## 2023-04-13 ENCOUNTER — Other Ambulatory Visit (INDEPENDENT_AMBULATORY_CARE_PROVIDER_SITE_OTHER): Payer: Self-pay | Admitting: Nurse Practitioner

## 2023-04-13 DIAGNOSIS — I771 Stricture of artery: Secondary | ICD-10-CM

## 2023-04-14 ENCOUNTER — Ambulatory Visit (INDEPENDENT_AMBULATORY_CARE_PROVIDER_SITE_OTHER): Admitting: Vascular Surgery

## 2023-04-14 ENCOUNTER — Ambulatory Visit (INDEPENDENT_AMBULATORY_CARE_PROVIDER_SITE_OTHER)

## 2023-04-14 ENCOUNTER — Encounter (INDEPENDENT_AMBULATORY_CARE_PROVIDER_SITE_OTHER): Payer: Self-pay | Admitting: Vascular Surgery

## 2023-04-14 VITALS — BP 112/74 | HR 67 | Resp 16 | Ht 67.0 in | Wt 147.6 lb

## 2023-04-14 DIAGNOSIS — I10 Essential (primary) hypertension: Secondary | ICD-10-CM | POA: Diagnosis not present

## 2023-04-14 DIAGNOSIS — I771 Stricture of artery: Secondary | ICD-10-CM

## 2023-04-14 DIAGNOSIS — I6521 Occlusion and stenosis of right carotid artery: Secondary | ICD-10-CM

## 2023-04-14 DIAGNOSIS — E785 Hyperlipidemia, unspecified: Secondary | ICD-10-CM | POA: Diagnosis not present

## 2023-04-14 NOTE — Progress Notes (Unsigned)
MRN : [id number]  Brad Chen is a 62 y.o. (12-Oct-1960) male who presents with chief complaint of No chief complaint on file. [name]  History of Present Illness: Patient returns today in follow up of ***  Current Outpatient Medications  Medication Sig Dispense Refill   acetaminophen (TYLENOL) 500 MG tablet Take 2 tablets (1,000 mg total) by mouth every 8 (eight) hours. 90 tablet 2   aspirin EC 81 MG tablet Take 1 tablet (81 mg total) by mouth daily. Swallow whole. 150 tablet 2   atorvastatin (LIPITOR) 10 MG tablet Take 1 tablet (10 mg total) by mouth daily. 30 tablet 11   clopidogrel (PLAVIX) 75 MG tablet Take 1 tablet (75 mg total) by mouth daily. 30 tablet 11   No current facility-administered medications for this visit.    Past Medical History:  Diagnosis Date   Hypertension     Past Surgical History:  Procedure Laterality Date   CARPAL TUNNEL RELEASE Bilateral    NECK SURGERY  2003   discectomy   SHOULDER ARTHROSCOPY WITH SUBACROMIAL DECOMPRESSION AND OPEN ROTATOR C Left 07/22/2022   Procedure: Left shoulder arthroscopic subscapularis repair, mini open rotator cuff repair vs reconstruction with allograft, subacromial decompression, distal clavicle excision, and biceps tenodesis;  Surgeon: Signa Kell, MD;  Location: [facility];  Service: Orthopedics;  Laterality: Left;   UPPER EXTREMITY ANGIOGRAPHY Left 02/13/2023   Procedure: Upper Extremity Angiography;  Surgeon: Annice Needy, MD;  Location: [facility];  Service: Cardiovascular;  Laterality: Left;     Social History   Tobacco Use   Smoking status: Former    Current packs/day: 1.00    Types: Cigarettes   Smokeless tobacco: Never  Vaping Use   Vaping status: Never Used  Substance Use Topics   Alcohol use: Not Currently    Comment: pt denies drinking but etoh 3.0 on scene   Drug use: Never      Family History  Problem Relation Age of Onset   Hypertension Mother   No bleeding or clotting  disorders No aneurysms   No Known Allergies  REVIEW OF SYSTEMS (Negative unless checked)   Constitutional: [] Weight loss  [] Fever  [] Chills Cardiac: [] Chest pain   [] Chest pressure   [] Palpitations   [] Shortness of breath when laying flat   [] Shortness of breath at rest   [] Shortness of breath with exertion. Vascular:  [] Pain in legs with walking   [] Pain in legs at rest   [] Pain in legs when laying flat   [] Claudication   [] Pain in feet when walking  [] Pain in feet at rest  [] Pain in feet when laying flat   [] History of DVT   [] Phlebitis   [] Swelling in legs   [] Varicose veins   [x] Non-healing ulcers Pulmonary:   [] Uses home oxygen   [] Productive cough   [] Hemoptysis   [] Wheeze  [] COPD   [] Asthma Neurologic:  [] Dizziness  [] Blackouts   [] Seizures   [] History of stroke   [] History of TIA  [] Aphasia   [] Temporary blindness   [] Dysphagia   [] Weakness or numbness in arms   [] Weakness or numbness in legs Musculoskeletal:  [] Arthritis   [] Joint swelling   [] Joint pain   [] Low back pain Hematologic:  [] Easy bruising  [] Easy bleeding   [] Hypercoagulable state   [] Anemic  [] Hepatitis Gastrointestinal:  [] Blood in stool   [] Vomiting blood  [] Gastroesophageal reflux/heartburn   [] Abdominal pain Genitourinary:  [] Chronic kidney disease   [] Difficult urination  [] Frequent urination  [] Burning with  urination   [] Hematuria Skin:  [] Rashes   [x] Ulcers   [x] Wounds Psychological:  [] History of anxiety   []  History of major depression.  Physical Examination  There were no vitals taken for this visit. Gen:  WD/WN, NAD Head: [place]/AT, No temporalis wasting. Ear/Nose/Throat: Hearing grossly intact, nares w/o erythema or drainage Eyes: Conjunctiva clear. Sclera non-icteric Neck: Supple.  Trachea midline Pulmonary:  Good air movement, no use of accessory muscles.  Cardiac: RRR, no JVD Vascular:  Vessel Right Left  Radial Palpable Palpable               Musculoskeletal: M/S 5/5 throughout.  No deformity or  atrophy. *** edema. Neurologic: Sensation grossly intact in extremities.  Symmetrical.  Speech is fluent.  Psychiatric: Judgment intact, Mood & affect appropriate for pt's clinical situation. Dermatologic: No rashes or ulcers noted.  No cellulitis or open wounds.      Labs Recent Results (from the past 2160 hour(s))  CBC with Differential     Status: Abnormal   Collection Time: 01/27/23  5:08 PM  Result Value Ref Range   WBC 11.9 (H) 4.0 - 10.5 K/uL   RBC 4.32 4.22 - 5.81 MIL/uL   Hemoglobin 13.7 13.0 - 17.0 g/dL   HCT 16.1 09.6 - 04.5 %   MCV 95.8 80.0 - 100.0 fL   MCH 31.7 26.0 - 34.0 pg   MCHC 33.1 30.0 - 36.0 g/dL   RDW 40.9 81.1 - 91.4 %   Platelets 225 150 - 400 K/uL   nRBC 0.0 0.0 - 0.2 %   Neutrophils Relative % 65 %   Neutro Abs 7.8 (H) 1.7 - 7.7 K/uL   Lymphocytes Relative 27 %   Lymphs Abs 3.2 0.7 - 4.0 K/uL   Monocytes Relative 5 %   Monocytes Absolute 0.6 0.1 - 1.0 K/uL   Eosinophils Relative 2 %   Eosinophils Absolute 0.2 0.0 - 0.5 K/uL   Basophils Relative 1 %   Basophils Absolute 0.1 0.0 - 0.1 K/uL   Immature Granulocytes 0 %   Abs Immature Granulocytes 0.05 0.00 - 0.07 K/uL    Comment: Performed at [facility], [address] [place], [place] [address]  Lactic acid, plasma     Status: Abnormal   Collection Time: 01/27/23  5:08 PM  Result Value Ref Range   Lactic Acid, Venous 2.2 (HH) 0.5 - 1.9 mmol/L    Comment: CRITICAL RESULT CALLED TO, READ BACK BY AND VERIFIED WITH LES WILLOUGHBY 01/27/23 1829 MU Performed at [facility], [address] [place], [place] [address]   Comprehensive metabolic panel     Status: Abnormal   Collection Time: 01/27/23  7:50 PM  Result Value Ref Range   Sodium 140 135 - 145 mmol/L   Potassium 3.7 3.5 - 5.1 mmol/L   Chloride 108 98 - 111 mmol/L   CO2 24 22 - 32 mmol/L   Glucose, Bld 88 70 - 99 mg/dL    Comment: Glucose reference range applies only to samples taken after fasting for at least 8  hours.   BUN 15 8 - 23 mg/dL   Creatinine, Ser 8.65 0.61 - 1.24 mg/dL   Calcium 8.2 (L) 8.9 - 10.3 mg/dL   Total Protein 6.8 6.5 - 8.1 g/dL   Albumin 3.7 3.5 - 5.0 g/dL   AST 21 15 - 41 U/L   ALT 28 0 - 44 U/L   Alkaline Phosphatase 68 38 - 126 U/L   Total Bilirubin 0.7  0.3 - 1.2 mg/dL   GFR, Estimated >02 >72 mL/min    Comment: (NOTE) Calculated using the CKD-EPI Creatinine Equation (2021)    Anion gap 8 5 - 15    Comment: Performed at [facility], [address] [place], [place] [address]  Culture, blood (single)     Status: None   Collection Time: 01/27/23  7:50 PM   Specimen: BLOOD  Result Value Ref Range   Specimen Description BLOOD BLOOD LEFT FOREARM    Special Requests      BOTTLES DRAWN AEROBIC AND ANAEROBIC Blood Culture adequate volume   Culture      NO GROWTH 5 DAYS Performed at [facility], [address] [place], [place] [address]    Report Status 02/01/2023 FINAL   Lactic acid, plasma     Status: None   Collection Time: 01/27/23  9:11 PM  Result Value Ref Range   Lactic Acid, Venous 0.9 0.5 - 1.9 mmol/L    Comment: Performed at [facility], [address], [place], [place] [address]    Radiology CT SOFT TISSUE NECK W CONTRAST  Result Date: 03/25/2023 CLINICAL DATA:  62 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 Former smoker. EXAM: CT NECK WITH CONTRAST TECHNIQUE: Multidetector CT imaging of the neck was performed using the standard protocol following the bolus administration of intravenous contrast. RADIATION DOSE REDUCTION: This exam was performed according to the departmental dose-optimization program which includes automated exposure control, adjustment of the mA and/or kV according to patient size and/or use of iterative reconstruction technique. CONTRAST:  75mL OMNIPAQUE IOHEXOL 300 MG/ML  SOLN COMPARISON:  No previous neck imaging available. FINDINGS: Pharynx and larynx: Glottis is closed.  Laryngeal soft tissue contours are within normal limits. Pharyngeal soft tissue contours appear symmetric and within normal limits. No discrete or hyperenhancing pharynx mass identified. Parapharyngeal and retropharyngeal spaces appear negative. Salivary glands: Negative.  Negative sublingual space. Thyroid: Right thyroid 7 mm nodule. Not clinically significant; no follow-up imaging recommended (ref: J Am Coll Radiol. 2015 Feb;12(2): 143-50). Lymph nodes: No cervical lymphadenopathy. Bilateral cervical nodes appear diminutive, symmetric, within normal limits. Vascular: Bulky and severe right carotid bifurcation calcified atherosclerosis with radiographic string sign stenosis on series 2, image 57. The right ICA and ECA remain patent. There is moderate contralateral left carotid bifurcation calcified plaque although more affecting the ECA origin. Other major vascular structures in the neck and at the skull base are patent. But extensive Calcified atherosclerosis at the skull base. Limited intracranial: Negative. Visualized orbits: Negative. Mastoids and visualized paranasal sinuses: Well aerated bilaterally. Skeleton: C5-C6 ACDF hardware in place. No adverse features, probable solid arthrodesis. No superimposed acute or suspicious osseous lesion in the neck. Upper chest: Tandem proximal left subclavian and left axillary artery vascular stents. Aortic arch and proximal great vessels appear to remain patent. No superior mediastinal lymphadenopathy. Nodular foci in the trachea at the thoracic inlet, possibly retained secretions but tracheal polyps are not excluded (series 3, image 94). Below that the trachea appears normal. Large lung volumes. Small 6 mm ground-glass sub solid right upper lobe lung nodule series 3, image 132. IMPRESSION: 1. No neck mass or lymphadenopathy identified. 2. But small nodular foci along the walls of the trachea at the thoracic inlet could be small tracheal polyps (versus retained  secretions). Consider direct visualization to confirm. 3. Small 6 mm sub solid right upper lobe lung nodule. Per Fleischner Society Guidelines recommend a non-contrast chest CT at 6-12 months, then  another non-contrast chest CT at 18-24 months. 4. Very severe carotid atherosclerosis on the right, RADIOGRAPHIC STRING SIGN STENOSIS. Recommend Vascular Surgery consultation. Electronically Signed   By: Odessa Fleming M.D.   On: 03/25/2023 06:38    Assessment/Plan  No problem-specific Assessment & Plan notes found for this encounter.    [name], MD  04/14/2023 1:19 PM    This note was created with Dragon medical transcription system.  Any errors from dictation are purely unintentional

## 2023-04-14 NOTE — H&P (View-Only) (Signed)
MRN : [id number]  Brad Chen is a 62 y.o. (12/10/1960) male who presents with chief complaint of  Chief Complaint  Patient presents with   Follow-up    pt conv carotid  .  History of Present Illness: Patient returns today in follow up of multiple vascular issues.  He underwent 2 stents placed in his left subclavian artery a few months ago for gangrenous changes of the finger on his left hand.  After revascularization and debridement by hand surgeon, this is almost completely healed with minimal tissue loss.  He seems to be doing fairly well from this.  He has a history of head neck cancer with surgical resection but did not have chemotherapy or radiation.  He had a follow-up CT scan earlier this month to evaluate his head neck cancer, and an incidental finding was made of a highly calcific right carotid artery stenosis that was read out as a radiographic string sign although this was not a CT angiogram or particularly well gated to evaluate arterial disease.  I have independently reviewed the scan.  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  We performed a carotid duplex today which showed velocities that would fall in the 60 to 79% range but the dense calcification may obscure higher velocities.  Current Outpatient Medications  Medication Sig Dispense Refill   aspirin EC 81 MG tablet Take 1 tablet (81 mg total) by mouth daily. Swallow whole. 150 tablet 2   atorvastatin (LIPITOR) 10 MG tablet Take 1 tablet (10 mg total) by mouth daily. 30 tablet 11   clopidogrel (PLAVIX) 75 MG tablet Take 1 tablet (75 mg total) by mouth daily. 30 tablet 11   No current facility-administered medications for this visit.    Past Medical History:  Diagnosis Date   Hypertension     Past Surgical History:  Procedure Laterality Date   CARPAL TUNNEL RELEASE Bilateral    NECK SURGERY  2003    discectomy   SHOULDER ARTHROSCOPY WITH SUBACROMIAL DECOMPRESSION AND OPEN ROTATOR C Left 07/22/2022   Procedure: Left shoulder arthroscopic subscapularis repair, mini open rotator cuff repair vs reconstruction with allograft, subacromial decompression, distal clavicle excision, and biceps tenodesis;  Surgeon: Signa Kell, MD;  Location: [facility];  Service: Orthopedics;  Laterality: Left;   UPPER EXTREMITY ANGIOGRAPHY Left 02/13/2023   Procedure: Upper Extremity Angiography;  Surgeon: Annice Needy, MD;  Location: [facility];  Service: Cardiovascular;  Laterality: Left;     Social History   Tobacco Use   Smoking status: Former    Current packs/day: 1.00    Types: Cigarettes   Smokeless tobacco: Never  Vaping Use   Vaping status: Never Used  Substance Use Topics   Alcohol use: Not Currently    Comment: pt denies drinking but 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 Vascular:  [] Pain in legs with walking   [] Pain in legs at  rest   [] Pain in legs when laying flat   [] Claudication   [] Pain in feet when walking  [] Pain in feet at rest  [] Pain in feet when laying flat   [] History of DVT   [] Phlebitis   [] Swelling in legs   [] Varicose veins   [x] Non-healing ulcers Pulmonary:   [] Uses home oxygen   [] Productive cough   [] Hemoptysis   [] Wheeze  [] COPD   [] Asthma Neurologic:  [] Dizziness  [] Blackouts   [] Seizures   [] History of stroke   [] History of TIA  [] Aphasia   [] Temporary blindness   [] Dysphagia   [] Weakness or numbness in arms   [] Weakness or numbness in legs Musculoskeletal:   [] Arthritis   [] Joint swelling   [] Joint pain   [] Low back pain Hematologic:  [] Easy bruising  [] Easy bleeding   [] Hypercoagulable state   [] Anemic  [] Hepatitis Gastrointestinal:  [] Blood in stool   [] Vomiting blood  [] Gastroesophageal reflux/heartburn   [] Abdominal pain Genitourinary:  [] Chronic kidney disease   [] Difficult urination  [] Frequent urination  [] Burning with urination   [] Hematuria Skin:  [] Rashes   [x] Ulcers   [x] Wounds Psychological:  [] History of anxiety   []  History of major depression.  Physical Examination  BP 112/74 (BP Location: Right Arm)   Pulse 67   Resp 16   Ht 5\' 7"  (1.702 m)   Wt 147 lb 9.6 oz (67 kg)   BMI 23.12 kg/m  Gen:  WD/WN, NAD Head: [place]/AT, No temporalis wasting. Ear/Nose/Throat: Hearing grossly intact, nares w/o erythema or drainage Eyes: Conjunctiva clear. Sclera non-icteric Neck: Supple.  Trachea midline Pulmonary:  Good air movement, no use of accessory muscles.  Cardiac: RRR, no JVD Vascular:  Vessel Right Left  Radial Palpable Palpable               Musculoskeletal: M/S 5/5 throughout.  No deformity or atrophy.  Left hand fingertip area almost completely healed with a small scab.  No significant lower extremity edema. Neurologic: Sensation grossly intact in extremities.  Symmetrical.  Speech is fluent.  Psychiatric: Judgment intact, Mood & affect appropriate for pt's clinical situation. Dermatologic: No rashes or ulcers noted.  No cellulitis or open wounds.      Labs Recent Results (from the past 2160 hour(s))  CBC with Differential     Status: Abnormal   Collection Time: 01/27/23  5:08 PM  Result Value Ref Range   WBC 11.9 (H) 4.0 - 10.5 K/uL   RBC 4.32 4.22 - 5.81 MIL/uL   Hemoglobin 13.7 13.0 - 17.0 g/dL   HCT 16.1 09.6 - 04.5 %   MCV 95.8 80.0 - 100.0 fL   MCH 31.7 26.0 - 34.0 pg   MCHC 33.1 30.0 - 36.0 g/dL   RDW 40.9 81.1 - 91.4 %   Platelets 225 150 - 400 K/uL   nRBC 0.0 0.0 - 0.2 %   Neutrophils Relative % 65 %    Neutro Abs 7.8 (H) 1.7 - 7.7 K/uL   Lymphocytes Relative 27 %   Lymphs Abs 3.2 0.7 - 4.0 K/uL   Monocytes Relative 5 %   Monocytes Absolute 0.6 0.1 - 1.0 K/uL   Eosinophils Relative 2 %   Eosinophils Absolute 0.2 0.0 - 0.5 K/uL   Basophils Relative 1 %   Basophils Absolute 0.1 0.0 - 0.1 K/uL   Immature Granulocytes 0 %   Abs Immature Granulocytes 0.05 0.00 - 0.07 K/uL    Comment: Performed at [facility], [address] [place], [place] [address]  Lactic acid, plasma     Status: Abnormal   Collection Time: 01/27/23  5:08 PM  Result Value Ref Range   Lactic Acid, Venous 2.2 (HH) 0.5 - 1.9 mmol/L    Comment: CRITICAL RESULT CALLED TO, READ BACK BY AND VERIFIED WITH LES WILLOUGHBY 01/27/23 1829 MU Performed at [facility], [address] [place], [place] [address]   Comprehensive metabolic panel     Status: Abnormal   Collection Time: 01/27/23  7:50 PM  Result Value Ref Range   Sodium 140 135 - 145 mmol/L   Potassium 3.7 3.5 - 5.1 mmol/L   Chloride 108 98 - 111 mmol/L   CO2 24 22 - 32 mmol/L   Glucose, Bld 88 70 - 99 mg/dL    Comment: Glucose reference range applies only to samples taken after fasting for at least 8 hours.   BUN 15 8 - 23 mg/dL   Creatinine, Ser 6.38 0.61 - 1.24 mg/dL   Calcium 8.2 (L) 8.9 - 10.3 mg/dL   Total Protein 6.8 6.5 - 8.1 g/dL   Albumin 3.7 3.5 - 5.0 g/dL   AST 21 15 - 41 U/L   ALT 28 0 - 44 U/L   Alkaline Phosphatase 68 38 - 126 U/L   Total Bilirubin 0.7 0.3 - 1.2 mg/dL   GFR, Estimated >75 >64 mL/min    Comment: (NOTE) Calculated using the CKD-EPI Creatinine Equation (2021)    Anion gap 8 5 - 15    Comment: Performed at [facility], [address] [place], [place] [address]  Culture, blood (single)     Status: None   Collection Time: 01/27/23  7:50 PM   Specimen: BLOOD  Result Value Ref Range   Specimen Description BLOOD BLOOD LEFT FOREARM    Special Requests      BOTTLES DRAWN AEROBIC AND ANAEROBIC Blood  Culture adequate volume   Culture      NO GROWTH 5 DAYS Performed at [facility], 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 Former smoker. EXAM: CT NECK WITH CONTRAST TECHNIQUE: Multidetector CT imaging of the neck was performed using the standard protocol following the bolus administration of intravenous contrast. RADIATION DOSE REDUCTION: This exam was performed according to the departmental dose-optimization program which includes automated exposure control, adjustment of the mA and/or kV according to patient size and/or use of iterative reconstruction technique. CONTRAST:  75mL OMNIPAQUE IOHEXOL 300 MG/ML  SOLN COMPARISON:  No previous neck imaging available. FINDINGS: Pharynx and larynx: Glottis is closed. Laryngeal soft tissue contours are within normal limits. Pharyngeal soft tissue contours appear symmetric and within normal limits. No discrete or hyperenhancing pharynx mass identified. Parapharyngeal and retropharyngeal spaces appear negative. Salivary glands: Negative.  Negative sublingual space. Thyroid: Right thyroid 7 mm nodule. Not clinically significant; no follow-up imaging recommended (ref: J Am Coll Radiol. 2015 Feb;12(2): 143-50). Lymph nodes: No cervical lymphadenopathy. Bilateral cervical nodes appear diminutive, symmetric, within normal limits. Vascular: Bulky and severe right carotid bifurcation calcified atherosclerosis with radiographic  string sign stenosis on series 2, image 57. The right ICA and ECA remain patent. There is moderate  contralateral left carotid bifurcation calcified plaque although more affecting the ECA origin. Other major vascular structures in the neck and at the skull base are patent. But extensive Calcified atherosclerosis at the skull base. Limited intracranial: Negative. Visualized orbits: Negative. Mastoids and visualized paranasal sinuses: Well aerated bilaterally. Skeleton: C5-C6 ACDF hardware in place. No adverse features, probable solid arthrodesis. No superimposed acute or suspicious osseous lesion in the neck. Upper chest: Tandem proximal left subclavian and left axillary artery vascular stents. Aortic arch and proximal great vessels appear to remain patent. No superior mediastinal lymphadenopathy. Nodular foci in the trachea at the thoracic inlet, possibly retained secretions but tracheal polyps are not excluded (series 3, image 94). Below that the trachea appears normal. Large lung volumes. Small 6 mm ground-glass sub solid right upper lobe lung nodule series 3, image 132. IMPRESSION: 1. No neck mass or lymphadenopathy identified. 2. But small nodular foci along the walls of the trachea at the thoracic inlet could be small tracheal polyps (versus retained secretions). Consider direct visualization to confirm. 3. Small 6 mm sub solid right upper lobe lung nodule. Per Fleischner Society Guidelines recommend a non-contrast chest CT at 6-12 months, then another non-contrast chest CT at 18-24 months. 4. Very severe carotid atherosclerosis on the right, RADIOGRAPHIC STRING SIGN STENOSIS. Recommend Vascular Surgery consultation. Electronically Signed   By: Odessa Fleming M.D.   On: 03/25/2023 06:38    Assessment/Plan  Carotid stenosis CT scan performed for head neck cancer earlier this month is reported as a string sign right carotid artery stenosis.  I have independently reviewed the scan.  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  His subclavian artery stents also appear  to be patent.  We performed a carotid duplex today which showed velocities that would fall in the 60 to 79% range but the dense calcification may obscure higher velocities. I had a long discussion today with the patient and his wife over the phone regarding this finding.  Given his relatively young age and good functional status, any stenosis of greater than 70% should be treated for stroke risk reduction.  I had a long discussion regarding treatment options including carotid endarterectomy, carotid artery stenting, and TCAR.  I discussed the differences between these and medical management which she is already on.  Given his highly calcific lesion, this would favor open surgery over endarterectomy somewhat and he and his wife are strongly desirous of open carotid endarterectomy at this time.  I have discussed the details of the procedure and the expected course.  I discussed the ICU stay and an expected 1 night hospitalization.  I discussed a 2 to 3% risk of stroke, cardiopulmonary complications, cranial nerve injury, bleeding, and infection.  I discussed the reason for repair is long-term benefit for stroke risk reduction.  They voiced their understanding and desire to proceed.  Hypertension blood pressure control important in reducing the progression of atherosclerotic disease. On appropriate oral medications.   Hyperlipidemia lipid control important in reducing the progression of atherosclerotic disease. Continue statin therapy   Subclavian artery stenosis, left (HCC) Patient had 2 left subclavian stents placed several months ago for revascularization of the gangrenous finger.  These appear patent both on duplex recently as well as the CT scan.  Plan to recheck in 6 months with duplex.    [name], MD  04/15/2023 10:54 AM    This note was created with Dragon medical transcription system.  Any errors from dictation are purely unintentional

## 2023-04-15 DIAGNOSIS — I6529 Occlusion and stenosis of unspecified carotid artery: Secondary | ICD-10-CM | POA: Insufficient documentation

## 2023-04-15 DIAGNOSIS — I771 Stricture of artery: Secondary | ICD-10-CM | POA: Insufficient documentation

## 2023-04-15 NOTE — Assessment & Plan Note (Signed)
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  I have independently reviewed the scan.  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  We performed a carotid duplex today which showed velocities that would fall in the 60 to 79% range but the dense calcification may obscure higher velocities. I had a long discussion today with the patient and his wife over the phone regarding this finding.  Given his relatively young age and good functional status, any stenosis of greater than 70% should be treated for stroke risk reduction.  I had a long discussion regarding treatment options including carotid endarterectomy, carotid artery stenting, and TCAR.  I discussed the differences between these and medical management which she is already on.  Given his highly calcific lesion, this would favor open surgery over endarterectomy somewhat and he and his wife are strongly desirous of open carotid endarterectomy at this time.  I have discussed the details of the procedure and the expected course.  I discussed the ICU stay and an expected 1 night hospitalization.  I discussed a 2 to 3% risk of stroke, cardiopulmonary complications, cranial nerve injury, bleeding, and infection.  I discussed the reason for repair is long-term benefit for stroke risk reduction.  They voiced their understanding and desire to proceed.

## 2023-04-15 NOTE — Assessment & Plan Note (Signed)
blood pressure control important in reducing the progression of atherosclerotic disease. On appropriate oral medications.  

## 2023-04-15 NOTE — Assessment & Plan Note (Signed)
lipid control important in reducing the progression of atherosclerotic disease. Continue statin therapy  

## 2023-04-15 NOTE — Assessment & Plan Note (Signed)
Patient had 2 left subclavian stents placed several months ago for revascularization of the gangrenous finger.  These appear patent both on duplex recently as well as the CT scan.  Plan to recheck in 6 months with duplex.

## 2023-04-21 ENCOUNTER — Ambulatory Visit (INDEPENDENT_AMBULATORY_CARE_PROVIDER_SITE_OTHER): Payer: PRIVATE HEALTH INSURANCE | Admitting: Vascular Surgery

## 2023-04-21 ENCOUNTER — Other Ambulatory Visit (INDEPENDENT_AMBULATORY_CARE_PROVIDER_SITE_OTHER): Payer: PRIVATE HEALTH INSURANCE

## 2023-04-24 ENCOUNTER — Telehealth (INDEPENDENT_AMBULATORY_CARE_PROVIDER_SITE_OTHER): Payer: Self-pay

## 2023-04-24 NOTE — Telephone Encounter (Signed)
Spoke with the patient's spouse regarding getting him scheduled for a RCEA with Dr. Wyn Quaker. I explained that I have 05/07/23 available for him to be scheduled and was told by the spouse to wait and she would let me know on Monday.

## 2023-04-29 ENCOUNTER — Other Ambulatory Visit (INDEPENDENT_AMBULATORY_CARE_PROVIDER_SITE_OTHER): Payer: Self-pay | Admitting: Nurse Practitioner

## 2023-04-29 DIAGNOSIS — I6521 Occlusion and stenosis of right carotid artery: Secondary | ICD-10-CM

## 2023-04-30 ENCOUNTER — Encounter
Admission: RE | Admit: 2023-04-30 | Discharge: 2023-04-30 | Disposition: A | Discharge status: Home / Self Care | Source: Ambulatory Visit | Attending: Vascular Surgery | Admitting: Vascular Surgery

## 2023-04-30 VITALS — BP 144/87 | HR 82 | Resp 14 | Ht 67.0 in | Wt 142.9 lb

## 2023-04-30 DIAGNOSIS — I6521 Occlusion and stenosis of right carotid artery: Secondary | ICD-10-CM | POA: Diagnosis not present

## 2023-04-30 DIAGNOSIS — Z01818 Encounter for other preprocedural examination: Secondary | ICD-10-CM | POA: Insufficient documentation

## 2023-04-30 HISTORY — DX: Alcohol abuse, in remission: F10.11

## 2023-04-30 HISTORY — DX: Hyperlipidemia, unspecified: E78.5

## 2023-04-30 HISTORY — DX: Disorder of arteries and arterioles, unspecified: I77.9

## 2023-04-30 HISTORY — DX: Hypocalcemia: E83.51

## 2023-04-30 HISTORY — DX: Other cervical disc degeneration, unspecified cervical region: M50.30

## 2023-04-30 HISTORY — DX: Atherosclerosis of aorta: I70.0

## 2023-04-30 HISTORY — DX: Developmental disorders of jaws: M27.0

## 2023-04-30 HISTORY — DX: Personal history of other venous thrombosis and embolism: Z86.718

## 2023-04-30 HISTORY — DX: Other cerebrovascular disease: I67.89

## 2023-04-30 HISTORY — DX: Gangrene, not elsewhere classified: I96

## 2023-04-30 HISTORY — DX: Other specified postprocedural states: Z98.890

## 2023-04-30 LAB — BASIC METABOLIC PANEL
Anion gap: 10 (ref 5–15)
BUN: 17 mg/dL (ref 8–23)
CO2: 24 mmol/L (ref 22–32)
Calcium: 8.9 mg/dL (ref 8.9–10.3)
Chloride: 104 mmol/L (ref 98–111)
Creatinine, Ser: 1.13 mg/dL (ref 0.61–1.24)
GFR, Estimated: >60 mL/min (ref >60)
Glucose, Bld: 97 mg/dL (ref 70–99)
Potassium: 3.3 mmol/L — ABNORMAL LOW (ref 3.5–5.1)
Sodium: 138 mmol/L (ref 135–145)

## 2023-04-30 LAB — CBC WITH DIFFERENTIAL/PLATELET
Abs Immature Granulocytes: 0.05 10*3/uL (ref 0.00–0.07)
Basophils Absolute: 0.1 10*3/uL (ref 0.0–0.1)
Basophils Relative: 1 %
Eosinophils Absolute: 0.1 10*3/uL (ref 0.0–0.5)
Eosinophils Relative: 1 %
HCT: 37.3 % — ABNORMAL LOW (ref 39.0–52.0)
Hemoglobin: 12.8 g/dL — ABNORMAL LOW (ref 13.0–17.0)
Immature Granulocytes: 1 %
Lymphocytes Relative: 24 %
Lymphs Abs: 2.7 10*3/uL (ref 0.7–4.0)
MCH: 31.9 pg (ref 26.0–34.0)
MCHC: 34.3 g/dL (ref 30.0–36.0)
MCV: 93 fL (ref 80.0–100.0)
Monocytes Absolute: 0.8 10*3/uL (ref 0.1–1.0)
Monocytes Relative: 7 %
Neutro Abs: 7.4 10*3/uL (ref 1.7–7.7)
Neutrophils Relative %: 66 %
Platelets: 277 10*3/uL (ref 150–400)
RBC: 4.01 MIL/uL — ABNORMAL LOW (ref 4.22–5.81)
RDW: 13 % (ref 11.5–15.5)
WBC: 11.1 10*3/uL — ABNORMAL HIGH (ref 4.0–10.5)
nRBC: 0 % (ref 0.0–0.2)

## 2023-04-30 LAB — TYPE AND SCREEN
ABO/RH(D): A POS
Antibody Screen: NEGATIVE

## 2023-04-30 LAB — SURGICAL PCR SCREEN
MRSA, PCR: NEGATIVE
Staphylococcus aureus: NEGATIVE

## 2023-04-30 NOTE — Patient Instructions (Addendum)
Your procedure is scheduled on:05-07-23 Thursday Report to the Registration Desk on the 1st floor of the Medical Mall.Then proceed to the 2nd floor Surgery Desk To find out your arrival time, please call [phone] between 1PM - 3PM on:05-06-23 Wednesday If your arrival time is 6:00 am, do not arrive before that time as the Medical Mall entrance doors do not open until 6:00 am.  REMEMBER: Instructions that are not followed completely may result in serious medical risk, up to and including death; or upon the discretion of your surgeon and anesthesiologist your surgery may need to be rescheduled.  Do not eat food OR drink any liquids after midnight the night before surgery.  No gum chewing or hard candies.  One week prior to surgery: Stop Anti-inflammatories (NSAIDS) such as Advil, Aleve, Ibuprofen, Motrin, Naproxen, Naprosyn and Aspirin based products such as Excedrin, Goody's Powder, BC Powder.You may however, take Tylenol if needed for pain up until the day of surgery. Stop ANY OVER THE COUNTER supplements/vitamins NOW (04-30-23) until after surgery (Multivitamin and Vitamin C)   Continue taking all prescribed medications with the exception of the following: -clopidogrel (PLAVIX)-Stop 5 days prior to surgery-Last dose will be on 05-01-23 Friday  TAKE ONLY THESE MEDICATIONS THE MORNING OF SURGERY WITH A SIP OF WATER: -atorvastatin (LIPITOR)   Continue 81 mg Aspirin up until the day prior to surgery-Do NOT take the morning of surgery  No Alcohol for 24 hours before or after surgery.  No Smoking including e-cigarettes for 24 hours before surgery.  No chewable tobacco products for at least 6 hours before surgery.  No nicotine patches on the day of surgery.  Do not use any "recreational" drugs for at least a week (preferably 2 weeks) before your surgery.  Please be advised that the combination of cocaine and anesthesia may have negative outcomes, up to and including death. If you test  positive for cocaine, your surgery will be cancelled.  On the morning of surgery brush your teeth with toothpaste and water, you may rinse your mouth with mouthwash if you wish. Do not swallow any toothpaste or mouthwash.  Use CHG Soap as directed on instruction sheet.  Do not wear jewelry, make-up, hairpins, clips or nail polish.  Do not wear lotions, powders, or perfumes.   Do not shave body hair from the neck down 48 hours before surgery.  Contact lenses, hearing aids and dentures may not be worn into surgery.  Do not bring valuables to the hospital. [facility] is not responsible for any missing/lost belongings or valuables.   Notify your doctor if there is any change in your medical condition (cold, fever, infection).  Wear comfortable clothing (specific to your surgery type) to the hospital.  After surgery, you can help prevent lung complications by doing breathing exercises.  Take deep breaths and cough every 1-2 hours. Your doctor may order a device called an Incentive Spirometer to help you take deep breaths. When coughing or sneezing, hold a pillow firmly against your incision with both hands. This is called "splinting." Doing this helps protect your incision. It also decreases belly discomfort.  If you are being admitted to the hospital overnight, leave your suitcase in the car. After surgery it may be brought to your room.  In case of increased patient census, it may be necessary for you, the patient, to continue your postoperative care in the Same Day Surgery department.  If you are being discharged the day of surgery, you will not be allowed  to drive home. You will need a responsible individual to drive you home and stay with you for 24 hours after surgery.   If you are taking public transportation, you will need to have a responsible individual with you.  Please call the Pre-admissions Testing Dept. at [phone] if you have any questions about these  instructions.  Surgery Visitation Policy:  Patients having surgery or a procedure may have two visitors.  Children under the age of 67 must have an adult with them who is not the patient.  Inpatient Visitation:    Visiting hours are 7 a.m. to 8 p.m. Up to four visitors are allowed at one time in a patient room. The visitors may rotate out with other people during the day.  One visitor age 74 or older may stay with the patient overnight and must be in the room by 8 p.m.     Preparing for Surgery with CHLORHEXIDINE GLUCONATE (CHG) Soap  Chlorhexidine Gluconate (CHG) Soap  o An antiseptic cleaner that kills germs and bonds with the skin to continue killing germs even after washing  o Used for showering the night before surgery and morning of surgery  Before surgery, you can play an important role by reducing the number of germs on your skin.  CHG (Chlorhexidine gluconate) soap is an antiseptic cleanser which kills germs and bonds with the skin to continue killing germs even after washing.  Please do not use if you have an allergy to CHG or antibacterial soaps. If your skin becomes reddened/irritated stop using the CHG.  1. Shower the NIGHT BEFORE SURGERY and the MORNING OF SURGERY with CHG soap.  2. If you choose to wash your hair, wash your hair first as usual with your normal shampoo.  3. After shampooing, rinse your hair and body thoroughly to remove the shampoo.  4. Use CHG as you would any other liquid soap. You can apply CHG directly to the skin and wash gently with a scrungie or a clean washcloth.  5. Apply the CHG soap to your body only from the neck down. Do not use on open wounds or open sores. Avoid contact with your eyes, ears, mouth, and genitals (private parts). Wash face and genitals (private parts) with your normal soap.  6. Wash thoroughly, paying special attention to the area where your surgery will be performed.  7. Thoroughly rinse your body with warm  water.  8. Do not shower/wash with your normal soap after using and rinsing off the CHG soap.  9. Pat yourself dry with a clean towel.  10. Wear clean pajamas to bed the night before surgery.  12. Place clean sheets on your bed the night of your first shower and do not sleep with pets.  13. Shower again with the CHG soap on the day of surgery prior to arriving at the hospital.  14. Do not apply any deodorants/lotions/powders.  15. Please wear clean clothes to the hospital.

## 2023-05-04 ENCOUNTER — Encounter: Payer: Self-pay | Admitting: Vascular Surgery

## 2023-05-04 NOTE — Progress Notes (Signed)
Perioperative / Anesthesia Services  Pre-Admission Testing Clinical Review / Preoperative Anesthesia Consult  Date: 05/06/23  Patient Demographics:  Name: Brad Chen DOB:   03/10/1961 MRN:   [id number]  Planned Surgical Procedure(s):    Case: [id number] Date/Time: 05/07/23 0715   Procedure: ENDARTERECTOMY CAROTID (Right)   Anesthesia type: General   Pre-op diagnosis: CAROTID STENOSIS   Location: [facility] OR ROOM 08 / [facility]S FOR ANESTHESIA [facility]   Surgeons: Annice Needy, MD     NOTE: Available PAT nursing documentation and vital signs have been reviewed. Clinical nursing staff has updated patient's PMH/PSHx, current medication list, and drug allergies/intolerances to ensure comprehensive history available to assist in medical decision making as it pertains to the aforementioned surgical procedure and anticipated anesthetic course. Extensive review of available clinical information personally performed. [place] [facility] and PSHx updated with any diagnoses/procedures that  may have been inadvertently omitted during his intake with the pre-admission testing department's nursing staff.  Clinical Discussion:  Brad Chen is a 62 y.o. male who is submitted for pre-surgical anesthesia review and clearance prior to him undergoing the above procedure. Patient is a Former Smoker (33 pack years). Pertinent PMH includes: CAD, cerebral microvascular disease, TIA, DVT, BILATERAL carotid artery disease, LEFT subclavian artery stenosis (s/p stenting), aortic atherosclerosis, HTN, HLD, RIGHT thyroid nodule, RIGHT upper lobe pulmonary nodule, squamous cell carcinoma of the oropharynx, cervical DDD.  Patient is followed by cardiology Darrold Junker, MD). He was last seen in the cardiology clinic on 07/21/2022; notes reviewed. At the time of his clinic visit, patient doing well overall from a cardiovascular perspective. Patient denied any chest pain, shortness of breath, PND, orthopnea, palpitations,  significant peripheral edema, weakness, fatigue, vertiginous symptoms, or presyncope/syncope. Patient with a past medical history significant for cardiovascular diagnoses. Documented physical exam was grossly benign, providing no evidence of acute exacerbation and/or decompensation of the patient's known cardiovascular conditions.  TTE performed on 02/20/2020 revealed a mildly reduced left ventricular systolic function with an EF of 45-50%.  There were no regional wall motion abnormalities.  Diastolic Doppler parameters were normal.  Right ventricular size and function normal.  PASP normal.  Left atrium was mildly dilated.  There was mild mitral valve regurgitation. All transvalvular gradients were noted to be normal providing no evidence suggestive of valvular stenosis.  Aorta normal in size with no evidence of aneurysmal dilatation  Exercise tolerance test performed on 07/21/2022.  Patient was able to exercise 9 minutes and 5 seconds on Bruce protocol achieving a maximum workload of 10.1 METS with an appropriate response to heart rate and blood pressure.  Associated ECG was negative for evidence of ischemia or arrhythmia.  Study determined to be normal.  CT imaging of the chest performed on 11/12/2022 and 02/25/2023 revealed extensive coronary artery calcifications and aortic atherosclerosis.  CT soft tissue neck performed on 03/17/2023 revealed very severe RIGHT carotid artery stenosis with evidence of a radiographic string sign.  Moderate contralateral LEFT carotid bifurcation calcified although more affecting the ECA origin. Extensive calcified atherosclerosis noted at the skull base.  BILATERAL carotid artery duplex performed on 04/14/2023 revealing a 60-79% stenosis of the RICA, with contralateral 1-39% stenosis of the LICA.  BILATERAL vertebral arteries demonstrated antegrade flow.  Normal flow hemodynamics noted in the patient's BILATERAL subclavian arteries.  Given patient's extensive PVD,  patient is on daily DAPT therapy (ASA + clopidogrel).  Patient is reported to be compliant with therapy with no evidence or reports of GI bleeding.  Blood pressure  reasonably controlled at 140/80 mmHg without the use of pharmacological intervention.  Patient is on atorvastatin for his HLD diagnosis and further ASCVD prevention.  He is not diabetic.  Patient does not have an OSAH diagnosis. Patient is able to complete all of his  ADL/IADLs without cardiovascular limitation.  Per the DASI, patient is able to achieve at least 4 METS of physical activity without experiencing any significant degree of angina/anginal equivalent symptoms.  No changes were made to his medication regimen.  Patient to follow-up with outpatient cardiology in 1 year or sooner if needed.  Brad Chen is scheduled for a RIGHT CAROTID ENDARTERECTOMY on 05/07/2023 with Dr. [name], MD.  Given patient's past medical history significant for cardiovascular diagnoses, presurgical cardiac clearance was sought by the PAT team. Per cardiology, "this patient is optimized for surgery and may proceed with the planned procedural course with a ACCEPTABLE risk of significant perioperative cardiovascular complications".   Again, this patient is on daily DAPT therapy.  He has been instructed on recommendations for holding his clopidogrel for 5 days prior to his procedure with plans to restart since postoperatively respectively minimized by his primary attending surgeon.  Patient will continue his daily low-dose ASA throughout his perioperative course.  The patient is aware that his last dose of clopidogrel should be on 05/01/2023.    Patient denies previous perioperative complications with anesthesia in the past. In review of the available records, it is noted that patient underwent a general anesthetic course at [facility] (ASA III) in 11/2022 without documented complications.      04/30/2023    1:09 PM  04/14/2023    1:49 PM 03/12/2023   10:38 AM  Vitals with BMI  Height 5\' 7"  5\' 7"  5\' 8"   Weight 142 lbs 14 oz 147 lbs 10 oz 158 lbs  BMI 22.37 23.11 24.03  Systolic 144 112 829  Diastolic 87 74 86  Pulse 82 67 75    Providers/Specialists:   NOTE: Primary physician provider listed below. Patient may have been seen by APP or partner within same practice.   PROVIDER ROLE / SPECIALTY LAST Shanna Cisco, MD Vascular Surgery (Surgeon) 04/14/2023  Lauro Regulus, MD Primary Care Provider 12/23/2022  Marcina Millard, MD Cardiology 07/21/2022  Noe Gens, MD  Head/Neck Surgical Oncology 02/25/2023   Allergies:  Patient has no known allergies.  Current Home Medications:   No current facility-administered medications for this encounter.    Ascorbic Acid (VITAMIN C PO)   aspirin EC 81 MG tablet   atorvastatin (LIPITOR) 10 MG tablet   clopidogrel (PLAVIX) 75 MG tablet   Multiple Vitamins-Minerals (MULTIVITAMIN ADULTS PO)   History:   Past Medical History:  Diagnosis Date   Aortic atherosclerosis (HCC)    CAD (coronary artery disease) 11/12/2022   a.) CT chest 11/12/2022: extensive CAD; b.) CT chest 02/25/2023: extensive CAD   Carotid arterial disease (HCC)    a.) CT soft tissue neck 03/25/2023: severe RICA stenosis with (+) string sign; b.) carotid doppler 04/14/2023: 60-79% RICA, 1-39% LICA   Cerebral microvascular disease    a.) s/p TIA 2018   DDD (degenerative disc disease), cervical    a.) s/p C5-C6 ACDF   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   H/O alcohol abuse    History of DVT (deep vein thrombosis)    Hyperlipidemia    Hypertension    Hypocalcemia    Long term current use of  aspirin    On long term clopidogrel therapy    Renal cyst, right    a.) CT chest 11/12/2022: 1.3 cm   Right thyroid nodule 03/17/2023   a.) CT soft tissue neck 03/17/2023: 7 mm   Right upper lobe pulmonary nodule 11/12/2022   a.) CT chest 11/12/2022: 6 mm (non-solid); b.) CT chest 02/25/2023: 5 mm (non-solid); c.) CT soft tissue neck 03/17/2023: 6 mm (sub solid)   Squamous cell carcinoma of oropharynx (HCC) 11/25/2022   a.) s/p partial pharyngectomy, small FOM lesion resection, biodesign placement 11/25/2022 --> pathology (+) for 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); p16 and HPV (-)   Status post carpal tunnel release of both wrists    Subclavian artery stenosis, left (HCC) 01/2023   a.) s/p PTA and stenting 02/13/2023--> tandem proximal LEFT subclavian and LEFT axillary artery stents   TIA (transient ischemic attack) 2018   Torus mandibularis    a.) s/p excision 11/25/2022   Past Surgical History:  Procedure Laterality Date   ANTERIOR CERVICAL DECOMP/DISCECTOMY FUSION N/A 2003   Procedure: ANTERIOR CERVICAL DECOMP/DISCECTOMY FUSION (C5-C6)   CARPAL TUNNEL RELEASE Bilateral    PHARYNGECTOMY  11/25/2022   Procedure(s): PR PARTIAL REMOVAL OF PHARYNX  PR REMOVAL NODES, NECK,CERV MOD RAD  PR EXCIS MOUTH MUCOSA/SUB,NO REPAIR  LTD PHARYNGECTOMY  CERVICAL LYMPHADENECTOMY (MODIFIED RADICAL NECK DISSECTION)  EXCISION OF LESION OF MUCOSA AND SUBMUCOSA, VESTIBULE OF MOUTH; WITHOUT REPAIR   SHOULDER ARTHROSCOPY WITH SUBACROMIAL DECOMPRESSION AND OPEN ROTATOR C Left 07/22/2022   Procedure: Left shoulder arthroscopic subscapularis repair, mini open rotator cuff repair vs reconstruction with allograft, subacromial decompression, distal clavicle excision, and biceps tenodesis;  Surgeon: Signa Kell, MD;  Location: [facility] ORS;  Service: Orthopedics;  Laterality: Left;   UPPER EXTREMITY ANGIOGRAPHY Left 02/13/2023   Procedure: Upper  Extremity Angiography;  Surgeon: Annice Needy, MD;  Location: [facility] INVASIVE CV LAB;  Service: Cardiovascular;  Laterality: Left;   Family History  Problem Relation Age of Onset   Hypertension Mother    Social History   Tobacco Use   Smoking status: Former    Average packs/day: 1 pack/day for 33.0 years (33.0 ttl pk-yrs)    Types: Cigarettes    Start date: 59   Smokeless tobacco: Never  Vaping Use   Vaping status: Never Used  Substance Use Topics   Alcohol use: Not Currently    Comment: stopped 12-2022 when dx with head/neck cancer   Drug use: Never    Pertinent Clinical Results:  LABS:   No visits with results within 3 Day(s) from this visit.  Latest known visit with results is:  Hospital Outpatient Visit on 04/30/2023  Component Date Value Ref Range Status   MRSA, PCR 04/30/2023 NEGATIVE  NEGATIVE Final   Staphylococcus aureus 04/30/2023 NEGATIVE  NEGATIVE Final   Comment: (NOTE) The Xpert SA Assay (FDA approved for NASAL specimens in patients 26 years of age and older), is one component of a comprehensive surveillance program. It is not intended to diagnose infection nor to guide or monitor treatment. Performed at [facility], [address] [place], [place] [address]    ABO/RH(D) 04/30/2023 A POS   Final   Antibody  Screen 04/30/2023 NEG   Final   Sample Expiration 04/30/2023 05/14/2023,2359   Final   Extend sample reason 04/30/2023    Final                   Value:NO TRANSFUSIONS OR PREGNANCY IN THE PAST 3 MONTHS Performed at [facility], [address] [place], [place] [address]    WBC 04/30/2023 11.1 (H)  4.0 - 10.5 K/uL Final   RBC 04/30/2023 4.01 (L)  4.22 - 5.81 MIL/uL Final   Hemoglobin 04/30/2023 12.8 (L)  13.0 - 17.0 g/dL Final   HCT 60/45/4098 37.3 (L)  39.0 - 52.0 % Final   MCV 04/30/2023 93.0  80.0 - 100.0 fL Final   MCH 04/30/2023 31.9  26.0 - 34.0 pg Final   MCHC 04/30/2023 34.3  30.0 - 36.0 g/dL Final   RDW 11/91/4782 13.0   11.5 - 15.5 % Final   Platelets 04/30/2023 277  150 - 400 K/uL Final   nRBC 04/30/2023 0.0  0.0 - 0.2 % Final   Neutrophils Relative % 04/30/2023 66  % Final   Neutro Abs 04/30/2023 7.4  1.7 - 7.7 K/uL Final   Lymphocytes Relative 04/30/2023 24  % Final   Lymphs Abs 04/30/2023 2.7  0.7 - 4.0 K/uL Final   Monocytes Relative 04/30/2023 7  % Final   Monocytes Absolute 04/30/2023 0.8  0.1 - 1.0 K/uL Final   Eosinophils Relative 04/30/2023 1  % Final   Eosinophils Absolute 04/30/2023 0.1  0.0 - 0.5 K/uL Final   Basophils Relative 04/30/2023 1  % Final   Basophils Absolute 04/30/2023 0.1  0.0 - 0.1 K/uL Final   Immature Granulocytes 04/30/2023 1  % Final   Abs Immature Granulocytes 04/30/2023 0.05  0.00 - 0.07 K/uL Final   Performed at [facility], [address] [place], [place] [address]   Sodium 04/30/2023 138  135 - 145 mmol/L Final   Potassium 04/30/2023 3.3 (L)  3.5 - 5.1 mmol/L Final   Chloride 04/30/2023 104  98 - 111 mmol/L Final   CO2 04/30/2023 24  22 - 32 mmol/L Final   Glucose, Bld 04/30/2023 97  70 - 99 mg/dL Final   Glucose reference range applies only to samples taken after fasting for at least 8 hours.   BUN 04/30/2023 17  8 - 23 mg/dL Final   Creatinine, Ser 04/30/2023 1.13  0.61 - 1.24 mg/dL Final   Calcium 30/86/5784 8.9  8.9 - 10.3 mg/dL Final   GFR, Estimated 04/30/2023 >60  >60 mL/min Final   Comment: (NOTE) Calculated using the CKD-EPI Creatinine Equation (2021)    Anion gap 04/30/2023 10  5 - 15 Final   Performed at [facility], [address] [place], [place] [address]    ECG: Date: 04/30/2023 Time ECG obtained: 1341 PM Rate: 69 bpm Rhythm: normal sinus Axis (leads I and aVF): Normal Intervals: PR 114 ms. QRS 90 ms. QTc 437 ms. ST segment and T wave changes: Inferior ST and T wave abnormalities Comparison: Similar to previous tracing obtained on 07/18/2022   IMAGING / PROCEDURES: VAS US CAROTID performed on  04/14/2023 Velocities in the right ICA are consistent with a 60-79% stenosis. Non-hemodynamically significant plaque <50% noted in the CCA. The ECA appears >50% stenosed. Moderate calcified shadowing plaque elevating velocities in the proximal ICA and ECA suggesting shadowed moderate bulb calcifications.  Velocities in the left ICA are consistent with a 1-39% stenosis.  Non-hemodynamically significant plaque <50%  noted in the CCA. Mild calcified plaque in the bulb/ICA.  Bilateral vertebral arteries demonstrate antegrade flow.  Normal flow hemodynamics were seen in bilateral subclavian arteries.   CT CHEST WITH CONTRAST performed on 02/25/2023 Unchanged 0.5 cm nonsolid right upper lobe nodule. Consider follow-up CT chest in 6 months to document continued stability.  Extensive coronary artery calcifications Normal caliber aorta with moderate calcifications Proximal LEFT subclavian artery vascular stent Moderate degenerative changes in thoracic spine Unchanged small lucent lesion in the T12 vertebrae Partially imaged ACDF  EXERCISE TOLERANCE/STRESS TEST performed on 07/21/2022 Exercised for 9:05 minutes on a Bruce protocol Achieved a workload of 10.1 METS with appropriate heart rate and blood pressure response.  ECG was negative for evidence of arrhythmia or ischemia.  The stress test was normal.   TRANSTHORACIC ECHOCARDIOGRAM performed on 02/14/2020 Left ventricular ejection fraction, by estimation, is 45 to 50%. The left ventricle has mildly decreased function. The left ventricle has no regional wall motion abnormalities. Left ventricular diastolic parameters were normal.  Right ventricular systolic function is normal. The right ventricular size is normal. There is normal pulmonary artery systolic pressure.  Left atrial size was mildly dilated.  The mitral valve is normal in structure. Mild mitral valve regurgitation.  The aortic valve is normal in structure. Aortic valve regurgitation is  not visualized.   Impression and Plan:  Brad Chen has been referred for pre-anesthesia review and clearance prior to him undergoing the planned anesthetic and procedural courses. Available labs, pertinent testing, and imaging results were personally reviewed by me in preparation for upcoming operative/procedural course. [facility] medical record has been updated following extensive record review and patient interview with PAT staff.   This patient has been appropriately cleared by cardiology with an overall ACCEPTABLE risk of experiencing significant perioperative cardiovascular complications. Based on clinical review performed today (05/06/23), barring any significant acute changes in the patient's overall condition, it is anticipated that he will be able to proceed with the planned surgical intervention. Any acute changes in clinical condition may necessitate his procedure being postponed and/or cancelled. Patient will meet with anesthesia team (MD and/or CRNA) on the day of his procedure for preoperative evaluation/assessment. Questions regarding anesthetic course will be fielded at that time.   Pre-surgical instructions were reviewed with the patient during his PAT appointment, and questions were fielded to satisfaction by PAT clinical staff. He has been instructed on which medications that he will need to hold prior to surgery, as well as the ones that have been deemed safe/appropriate to take on the day of his procedure. As part of the general education provided by PAT, patient made aware both verbally and in writing, that he would need to abstain from the use of any illegal substances during his perioperative course.  He was advised that failure to follow the provided instructions could necessitate case cancellation or result in serious perioperative complications up to and including death. Patient encouraged to contact PAT and/or his surgeon's office to discuss any questions or concerns that  may arise prior to surgery; verbalized understanding.   Quentin Mulling, MSN, APRN, FNP-C, CEN [facility]  Peri-operative Services Nurse Practitioner Phone: [phone] Fax: [phone] 05/06/23 7:49 AM  NOTE: This note has been prepared using Dragon dictation software. Despite my best ability to proofread, there is always the potential that unintentional transcriptional errors may still occur from this process.

## 2023-05-06 ENCOUNTER — Encounter: Payer: Self-pay | Admitting: Vascular Surgery

## 2023-05-07 ENCOUNTER — Other Ambulatory Visit: Payer: Self-pay

## 2023-05-07 ENCOUNTER — Inpatient Hospital Stay: Payer: Self-pay | Admitting: Urgent Care

## 2023-05-07 ENCOUNTER — Inpatient Hospital Stay: Admitting: Urgent Care

## 2023-05-07 ENCOUNTER — Inpatient Hospital Stay
Admission: RE | Admit: 2023-05-07 | Discharge: 2023-05-08 | DRG: 039 | Disposition: A | Discharge status: Home / Self Care | Source: Ambulatory Visit | Attending: Vascular Surgery | Admitting: Vascular Surgery

## 2023-05-07 ENCOUNTER — Encounter: Payer: Self-pay | Admitting: Vascular Surgery

## 2023-05-07 ENCOUNTER — Encounter
Admission: RE | Disposition: A | Discharge status: Home / Self Care | Payer: Self-pay | Source: Ambulatory Visit | Attending: Vascular Surgery

## 2023-05-07 DIAGNOSIS — Z86718 Personal history of other venous thrombosis and embolism: Secondary | ICD-10-CM | POA: Diagnosis not present

## 2023-05-07 DIAGNOSIS — F17210 Nicotine dependence, cigarettes, uncomplicated: Secondary | ICD-10-CM | POA: Diagnosis present

## 2023-05-07 DIAGNOSIS — Z8673 Personal history of transient ischemic attack (TIA), and cerebral infarction without residual deficits: Secondary | ICD-10-CM | POA: Diagnosis not present

## 2023-05-07 DIAGNOSIS — I6521 Occlusion and stenosis of right carotid artery: Principal | ICD-10-CM | POA: Diagnosis present

## 2023-05-07 DIAGNOSIS — Z79899 Other long term (current) drug therapy: Secondary | ICD-10-CM

## 2023-05-07 DIAGNOSIS — E785 Hyperlipidemia, unspecified: Secondary | ICD-10-CM | POA: Diagnosis present

## 2023-05-07 DIAGNOSIS — Z8249 Family history of ischemic heart disease and other diseases of the circulatory system: Secondary | ICD-10-CM | POA: Diagnosis not present

## 2023-05-07 DIAGNOSIS — Z85818 Personal history of malignant neoplasm of other sites of lip, oral cavity, and pharynx: Secondary | ICD-10-CM | POA: Diagnosis not present

## 2023-05-07 DIAGNOSIS — Z7982 Long term (current) use of aspirin: Secondary | ICD-10-CM | POA: Diagnosis not present

## 2023-05-07 DIAGNOSIS — Z8589 Personal history of malignant neoplasm of other organs and systems: Secondary | ICD-10-CM | POA: Diagnosis not present

## 2023-05-07 DIAGNOSIS — Z7902 Long term (current) use of antithrombotics/antiplatelets: Secondary | ICD-10-CM

## 2023-05-07 DIAGNOSIS — I2510 Atherosclerotic heart disease of native coronary artery without angina pectoris: Secondary | ICD-10-CM | POA: Diagnosis present

## 2023-05-07 DIAGNOSIS — I700 Atherosclerosis of aorta: Secondary | ICD-10-CM | POA: Diagnosis present

## 2023-05-07 DIAGNOSIS — I10 Essential (primary) hypertension: Secondary | ICD-10-CM | POA: Diagnosis present

## 2023-05-07 HISTORY — DX: Long term (current) use of anticoagulants: Z79.01

## 2023-05-07 HISTORY — DX: Cyst of kidney, acquired: N28.1

## 2023-05-07 HISTORY — DX: Long term (current) use of aspirin: Z79.82

## 2023-05-07 HISTORY — PX: ENDARTERECTOMY: SHX5162

## 2023-05-07 LAB — ABO/RH: ABO/RH(D): A POS

## 2023-05-07 LAB — GLUCOSE, CAPILLARY: Glucose-Capillary: 111 mg/dL — ABNORMAL HIGH (ref 70–99)

## 2023-05-07 SURGERY — ENDARTERECTOMY, CAROTID
Anesthesia: General | Site: Neck | Laterality: Right

## 2023-05-07 MED ORDER — SUCCINYLCHOLINE CHLORIDE 200 MG/10ML IV SOSY
PREFILLED_SYRINGE | INTRAVENOUS | Status: DC | PRN
  Administered 2023-05-07: 100 mg via INTRAVENOUS

## 2023-05-07 MED ORDER — FIBRIN SEALANT 2 ML SINGLE DOSE KIT
PACK | CUTANEOUS | Status: DC | PRN
  Administered 2023-05-07: 2 mL via TOPICAL

## 2023-05-07 MED ORDER — FENTANYL CITRATE (PF) 100 MCG/2ML IJ SOLN
INTRAMUSCULAR | Status: AC
  Filled 2023-05-07: qty 2

## 2023-05-07 MED ORDER — SODIUM CHLORIDE 0.9 % IV SOLN
INTRAVENOUS | Status: DC | PRN
  Administered 2023-05-07: 250 mL via INTRAMUSCULAR

## 2023-05-07 MED ORDER — CHLORHEXIDINE GLUCONATE CLOTH 2 % EX PADS
6.0000 | MEDICATED_PAD | Freq: Once | CUTANEOUS | Status: AC
  Administered 2023-05-07: 6 via TOPICAL

## 2023-05-07 MED ORDER — CHLORHEXIDINE GLUCONATE 0.12 % MT SOLN
OROMUCOSAL | Status: AC
  Filled 2023-05-07: qty 15

## 2023-05-07 MED ORDER — HEPARIN SODIUM (PORCINE) 1000 UNIT/ML IJ SOLN
INTRAMUSCULAR | Status: DC | PRN
  Administered 2023-05-07: 6000 [IU] via INTRAVENOUS

## 2023-05-07 MED ORDER — SODIUM CHLORIDE 0.9 % IV SOLN: INTRAVENOUS | Status: DC

## 2023-05-07 MED ORDER — METOPROLOL TARTRATE 5 MG/5ML IV SOLN: 2.0000 mg | INTRAVENOUS | Status: DC | PRN

## 2023-05-07 MED ORDER — LACTATED RINGERS IV SOLN: INTRAVENOUS | Status: DC

## 2023-05-07 MED ORDER — MORPHINE SULFATE (PF) 2 MG/ML IV SOLN: 2.0000 mg | INTRAVENOUS | Status: DC | PRN

## 2023-05-07 MED ORDER — SODIUM CHLORIDE 0.9 % IV SOLN: INTRAVENOUS | Status: DC | PRN

## 2023-05-07 MED ORDER — HYDROMORPHONE HCL 1 MG/ML IJ SOLN: 1.0000 mg | Freq: Once | INTRAMUSCULAR | Status: DC | PRN

## 2023-05-07 MED ORDER — ORAL CARE MOUTH RINSE: 15.0000 mL | Freq: Once | OROMUCOSAL | Status: AC

## 2023-05-07 MED ORDER — FIBRIN SEALANT 2 ML SINGLE DOSE KIT
PACK | CUTANEOUS | Status: AC
  Filled 2023-05-07: qty 2

## 2023-05-07 MED ORDER — POTASSIUM CHLORIDE CRYS ER 20 MEQ PO TBCR: 20.0000 meq | EXTENDED_RELEASE_TABLET | Freq: Every day | ORAL | Status: DC | PRN

## 2023-05-07 MED ORDER — SODIUM CHLORIDE 0.9 % IV SOLN: 500.0000 mL | Freq: Once | INTRAVENOUS | Status: DC | PRN

## 2023-05-07 MED ORDER — DOCUSATE SODIUM 100 MG PO CAPS
100.0000 mg | ORAL_CAPSULE | Freq: Every day | ORAL | Status: DC
  Administered 2023-05-08: 100 mg via ORAL
  Filled 2023-05-07: qty 1

## 2023-05-07 MED ORDER — NITROGLYCERIN IN D5W 200-5 MCG/ML-% IV SOLN
5.0000 ug/min | INTRAVENOUS | Status: DC
  Filled 2023-05-07: qty 250

## 2023-05-07 MED ORDER — LACTATED RINGERS IV SOLN
INTRAVENOUS | Status: DC | PRN
Start: 2023-05-07 — End: 2023-05-07

## 2023-05-07 MED ORDER — ONDANSETRON HCL 4 MG/2ML IJ SOLN: 4.0000 mg | Freq: Four times a day (QID) | INTRAMUSCULAR | Status: DC | PRN

## 2023-05-07 MED ORDER — GUAIFENESIN-DM 100-10 MG/5ML PO SYRP: 15.0000 mL | ORAL_SOLUTION | ORAL | Status: DC | PRN

## 2023-05-07 MED ORDER — PHENOL 1.4 % MT LIQD: 1.0000 | OROMUCOSAL | Status: DC | PRN

## 2023-05-07 MED ORDER — ALUM & MAG HYDROXIDE-SIMETH 200-200-20 MG/5ML PO SUSP: 15.0000 mL | ORAL | Status: DC | PRN

## 2023-05-07 MED ORDER — ADULT MULTIVITAMIN W/MINERALS CH
1.0000 | ORAL_TABLET | Freq: Every day | ORAL | Status: DC
  Administered 2023-05-08: 1 via ORAL
  Filled 2023-05-07: qty 1

## 2023-05-07 MED ORDER — HEPARIN SODIUM (PORCINE) 1000 UNIT/ML IJ SOLN
INTRAMUSCULAR | Status: AC
  Filled 2023-05-07: qty 10

## 2023-05-07 MED ORDER — PHENYLEPHRINE HCL-NACL 20-0.9 MG/250ML-% IV SOLN
INTRAVENOUS | Status: AC
  Filled 2023-05-07: qty 250

## 2023-05-07 MED ORDER — CHLORHEXIDINE GLUCONATE CLOTH 2 % EX PADS
6.0000 | MEDICATED_PAD | Freq: Once | CUTANEOUS | Status: AC
  Administered 2023-05-06: 6 via TOPICAL

## 2023-05-07 MED ORDER — LIDOCAINE HCL (PF) 1 % IJ SOLN
INTRAMUSCULAR | Status: AC
  Filled 2023-05-07: qty 30

## 2023-05-07 MED ORDER — ROCURONIUM BROMIDE 100 MG/10ML IV SOLN
INTRAVENOUS | Status: DC | PRN
  Administered 2023-05-07: 60 mg via INTRAVENOUS

## 2023-05-07 MED ORDER — FAMOTIDINE 20 MG PO TABS
20.0000 mg | ORAL_TABLET | Freq: Once | ORAL | Status: AC
  Administered 2023-05-07: 20 mg via ORAL

## 2023-05-07 MED ORDER — ATORVASTATIN CALCIUM 10 MG PO TABS
10.0000 mg | ORAL_TABLET | ORAL | Status: DC
  Administered 2023-05-07 – 2023-05-08 (×2): 10 mg via ORAL
  Filled 2023-05-07 (×2): qty 1

## 2023-05-07 MED ORDER — CEFAZOLIN SODIUM-DEXTROSE 2-4 GM/100ML-% IV SOLN
2.0000 g | INTRAVENOUS | Status: AC
  Administered 2023-05-07: 2 g via INTRAVENOUS

## 2023-05-07 MED ORDER — FAMOTIDINE 20 MG PO TABS
ORAL_TABLET | ORAL | Status: AC
  Filled 2023-05-07: qty 1

## 2023-05-07 MED ORDER — MIDAZOLAM HCL 2 MG/2ML IJ SOLN
INTRAMUSCULAR | Status: AC
  Filled 2023-05-07: qty 2

## 2023-05-07 MED ORDER — PROPOFOL 10 MG/ML IV BOLUS
INTRAVENOUS | Status: DC | PRN
Start: 2023-05-07 — End: 2023-05-07
  Administered 2023-05-07: 150 mg via INTRAVENOUS

## 2023-05-07 MED ORDER — HEMOSTATIC AGENTS (NO CHARGE) OPTIME
TOPICAL | Status: DC | PRN
  Administered 2023-05-07: 1 via TOPICAL

## 2023-05-07 MED ORDER — VITAMIN C 500 MG PO TABS
500.0000 mg | ORAL_TABLET | Freq: Every day | ORAL | Status: DC
  Administered 2023-05-08: 500 mg via ORAL
  Filled 2023-05-07 (×2): qty 1

## 2023-05-07 MED ORDER — 0.9 % SODIUM CHLORIDE (POUR BTL) OPTIME
TOPICAL | Status: DC | PRN
  Administered 2023-05-07: 500 mL

## 2023-05-07 MED ORDER — CEFAZOLIN SODIUM-DEXTROSE 2-4 GM/100ML-% IV SOLN
INTRAVENOUS | Status: AC
  Filled 2023-05-07: qty 100

## 2023-05-07 MED ORDER — CLOPIDOGREL BISULFATE 75 MG PO TABS
75.0000 mg | ORAL_TABLET | Freq: Every day | ORAL | Status: DC
  Administered 2023-05-07 – 2023-05-08 (×2): 75 mg via ORAL
  Filled 2023-05-07 (×2): qty 1

## 2023-05-07 MED ORDER — SUGAMMADEX SODIUM 200 MG/2ML IV SOLN
INTRAVENOUS | Status: DC | PRN
  Administered 2023-05-07: 200 mg via INTRAVENOUS

## 2023-05-07 MED ORDER — FAMOTIDINE IN NACL 20-0.9 MG/50ML-% IV SOLN
20.0000 mg | Freq: Two times a day (BID) | INTRAVENOUS | Status: DC
  Administered 2023-05-07 – 2023-05-08 (×2): 20 mg via INTRAVENOUS
  Filled 2023-05-07 (×3): qty 50

## 2023-05-07 MED ORDER — FENTANYL CITRATE (PF) 100 MCG/2ML IJ SOLN: 25.0000 ug | INTRAMUSCULAR | Status: DC | PRN

## 2023-05-07 MED ORDER — DEXAMETHASONE SODIUM PHOSPHATE 10 MG/ML IJ SOLN
INTRAMUSCULAR | Status: DC | PRN
  Administered 2023-05-07: 10 mg via INTRAVENOUS

## 2023-05-07 MED ORDER — ASPIRIN 81 MG PO TBEC
81.0000 mg | DELAYED_RELEASE_TABLET | Freq: Every day | ORAL | Status: DC
  Administered 2023-05-07 – 2023-05-08 (×2): 81 mg via ORAL
  Filled 2023-05-07 (×3): qty 1

## 2023-05-07 MED ORDER — OXYCODONE-ACETAMINOPHEN 5-325 MG PO TABS
1.0000 | ORAL_TABLET | ORAL | Status: DC | PRN
  Administered 2023-05-07: 1 via ORAL
  Administered 2023-05-07: 2 via ORAL
  Administered 2023-05-07: 1 via ORAL
  Filled 2023-05-07: qty 1
  Filled 2023-05-07: qty 2
  Filled 2023-05-07: qty 1

## 2023-05-07 MED ORDER — MAGNESIUM SULFATE 2 GM/50ML IV SOLN: 2.0000 g | Freq: Every day | INTRAVENOUS | Status: DC | PRN

## 2023-05-07 MED ORDER — PROPOFOL 10 MG/ML IV BOLUS
INTRAVENOUS | Status: AC
  Filled 2023-05-07: qty 40

## 2023-05-07 MED ORDER — PHENYLEPHRINE HCL-NACL 20-0.9 MG/250ML-% IV SOLN
INTRAVENOUS | Status: DC | PRN
  Administered 2023-05-07: 30 ug/min via INTRAVENOUS

## 2023-05-07 MED ORDER — LIDOCAINE HCL 1 % IJ SOLN
INTRAMUSCULAR | Status: DC | PRN
  Administered 2023-05-07: 10 mL

## 2023-05-07 MED ORDER — HYDRALAZINE HCL 20 MG/ML IJ SOLN
5.0000 mg | INTRAMUSCULAR | Status: DC | PRN
  Administered 2023-05-07: 5 mg via INTRAVENOUS
  Filled 2023-05-07: qty 1

## 2023-05-07 MED ORDER — OXYCODONE HCL 5 MG/5ML PO SOLN: 5.0000 mg | Freq: Once | ORAL | Status: DC | PRN

## 2023-05-07 MED ORDER — LABETALOL HCL 5 MG/ML IV SOLN: 10.0000 mg | INTRAVENOUS | Status: DC | PRN

## 2023-05-07 MED ORDER — FENTANYL CITRATE (PF) 100 MCG/2ML IJ SOLN
INTRAMUSCULAR | Status: DC | PRN
  Administered 2023-05-07 (×4): 50 ug via INTRAVENOUS

## 2023-05-07 MED ORDER — CHLORHEXIDINE GLUCONATE 0.12 % MT SOLN
15.0000 mL | Freq: Once | OROMUCOSAL | Status: AC
  Administered 2023-05-07: 15 mL via OROMUCOSAL

## 2023-05-07 MED ORDER — PHENYLEPHRINE HCL (PRESSORS) 10 MG/ML IV SOLN
INTRAVENOUS | Status: DC | PRN
  Administered 2023-05-07 (×2): 80 ug via INTRAVENOUS

## 2023-05-07 MED ORDER — ACETAMINOPHEN 325 MG PO TABS: 325.0000 mg | ORAL_TABLET | ORAL | Status: DC | PRN

## 2023-05-07 MED ORDER — PROPOFOL 1000 MG/100ML IV EMUL
INTRAVENOUS | Status: AC
  Filled 2023-05-07: qty 100

## 2023-05-07 MED ORDER — MIDAZOLAM HCL 2 MG/2ML IJ SOLN
INTRAMUSCULAR | Status: DC | PRN
  Administered 2023-05-07: 2 mg via INTRAVENOUS

## 2023-05-07 MED ORDER — ONDANSETRON HCL 4 MG/2ML IJ SOLN
INTRAMUSCULAR | Status: DC | PRN
Start: 2023-05-07 — End: 2023-05-07
  Administered 2023-05-07: 4 mg via INTRAVENOUS

## 2023-05-07 MED ORDER — CEFAZOLIN SODIUM-DEXTROSE 2-4 GM/100ML-% IV SOLN
2.0000 g | Freq: Three times a day (TID) | INTRAVENOUS | Status: AC
  Administered 2023-05-07 (×2): 2 g via INTRAVENOUS
  Filled 2023-05-07 (×2): qty 100

## 2023-05-07 MED ORDER — OXYCODONE HCL 5 MG PO TABS: 5.0000 mg | ORAL_TABLET | Freq: Once | ORAL | Status: DC | PRN

## 2023-05-07 MED ORDER — LIDOCAINE HCL (CARDIAC) PF 100 MG/5ML IV SOSY
PREFILLED_SYRINGE | INTRAVENOUS | Status: DC | PRN
  Administered 2023-05-07: 100 mg via INTRAVENOUS

## 2023-05-07 MED ORDER — ACETAMINOPHEN 325 MG RE SUPP: 325.0000 mg | RECTAL | Status: DC | PRN

## 2023-05-07 MED ORDER — DEXMEDETOMIDINE HCL IN NACL 80 MCG/20ML IV SOLN
INTRAVENOUS | Status: DC | PRN
  Administered 2023-05-07 (×2): 4 ug via INTRAVENOUS
  Administered 2023-05-07: 8 ug via INTRAVENOUS
  Administered 2023-05-07: 4 ug via INTRAVENOUS

## 2023-05-07 SURGICAL SUPPLY — 62 items
ADH SKN CLS APL DERMABOND .7 (GAUZE/BANDAGES/DRESSINGS) ×1
APL PRP STRL LF DISP 70% ISPRP (MISCELLANEOUS) ×1
BAG DECANTER FOR FLEXI CONT (MISCELLANEOUS) ×1 IMPLANT
BLADE SURG 15 STRL LF DISP TIS (BLADE) ×1 IMPLANT
BLADE SURG 15 STRL SS (BLADE) ×1
BLADE SURG SZ11 CARB STEEL (BLADE) ×1 IMPLANT
BOOT SUTURE VASCULAR YLW (MISCELLANEOUS) ×1
BRUSH SCRUB EZ 4% CHG (MISCELLANEOUS) ×1 IMPLANT
CHLORAPREP W/TINT 26 (MISCELLANEOUS) ×1 IMPLANT
CLAMP SUTURE YELLOW 5 PAIRS (MISCELLANEOUS) ×1 IMPLANT
DERMABOND ADVANCED .7 DNX12 (GAUZE/BANDAGES/DRESSINGS) ×1 IMPLANT
DRAPE INCISE IOBAN 66X45 STRL (DRAPES) ×1 IMPLANT
DRAPE LAPAROTOMY 77X122 PED (DRAPES) ×1 IMPLANT
DRAPE SHEET LG 3/4 BI-LAMINATE (DRAPES) ×1 IMPLANT
DRESSING SURGICEL FIBRLLR 1X2 (HEMOSTASIS) ×1 IMPLANT
DRSG SURGICEL FIBRILLAR 1X2 (HEMOSTASIS) ×1
ELECT CAUTERY BLADE 6.4 (BLADE) ×1 IMPLANT
ELECT REM PT RETURN 9FT ADLT (ELECTROSURGICAL) ×1
ELECTRODE REM PT RTRN 9FT ADLT (ELECTROSURGICAL) ×1 IMPLANT
GLOVE BIO SURGEON STRL SZ7 (GLOVE) ×2 IMPLANT
GOWN STRL REUS W/ TWL LRG LVL3 (GOWN DISPOSABLE) ×3 IMPLANT
GOWN STRL REUS W/ TWL XL LVL3 (GOWN DISPOSABLE) ×2 IMPLANT
GOWN STRL REUS W/TWL LRG LVL3 (GOWN DISPOSABLE) ×3
GOWN STRL REUS W/TWL XL LVL3 (GOWN DISPOSABLE) ×2
IV NS 250ML (IV SOLUTION) ×1
IV NS 250ML BAXH (IV SOLUTION) ×1 IMPLANT
KIT TURNOVER KIT A (KITS) ×1 IMPLANT
LABEL OR SOLS (LABEL) ×1 IMPLANT
LOOP VESSEL MAXI 1X406 RED (MISCELLANEOUS) ×2 IMPLANT
LOOP VESSEL MINI 0.8X406 BLUE (MISCELLANEOUS) ×1 IMPLANT
MANIFOLD NEPTUNE II (INSTRUMENTS) ×1 IMPLANT
NDL FILTER BLUNT 18X1 1/2 (NEEDLE) ×1 IMPLANT
NDL HYPO 25X1 1.5 SAFETY (NEEDLE) ×1 IMPLANT
NEEDLE FILTER BLUNT 18X1 1/2 (NEEDLE) ×1 IMPLANT
NEEDLE HYPO 25X1 1.5 SAFETY (NEEDLE) ×1 IMPLANT
NS IRRIG 500ML POUR BTL (IV SOLUTION) ×1 IMPLANT
PACK BASIN MAJOR ARMC (MISCELLANEOUS) ×1 IMPLANT
PATCH VASC XENOSURE .8CM X 8CM (Vascular Products) IMPLANT
SET WALTER ACTIVATION W/DRAPE (SET/KITS/TRAYS/PACK) ×1 IMPLANT
SHUNT W TPORT 9FR PRUITT F3 (SHUNT) ×1 IMPLANT
SPONGE T-LAP 18X18 ~~LOC~~+RFID (SPONGE) ×2 IMPLANT
SUT MNCRL 4-0 (SUTURE) ×1
SUT MNCRL 4-0 27XMFL (SUTURE) ×1
SUT PROLENE 6 0 BV (SUTURE) ×4 IMPLANT
SUT PROLENE 7 0 BV 1 (SUTURE) ×2 IMPLANT
SUT SILK 2 0 (SUTURE) ×1
SUT SILK 2-0 18XBRD TIE 12 (SUTURE) ×1 IMPLANT
SUT SILK 3 0 (SUTURE) ×1
SUT SILK 3-0 18XBRD TIE 12 (SUTURE) ×1 IMPLANT
SUT SILK 4 0 (SUTURE) ×1
SUT SILK 4-0 18XBRD TIE 12 (SUTURE) ×1 IMPLANT
SUT VIC AB 3-0 SH 27 (SUTURE) ×2
SUT VIC AB 3-0 SH 27X BRD (SUTURE) ×2 IMPLANT
SUTURE MNCRL 4-0 27XMF (SUTURE) ×1 IMPLANT
SYR 10ML LL (SYRINGE) ×2 IMPLANT
SYR 20ML LL LF (SYRINGE) ×1 IMPLANT
TAG SUTURE CLAMP YLW 5PR (MISCELLANEOUS) ×1
TRAP FLUID SMOKE EVACUATOR (MISCELLANEOUS) ×1 IMPLANT
TRAY FOLEY MTR SLVR 16FR STAT (SET/KITS/TRAYS/PACK) ×1 IMPLANT
TUBING CONNECTING 10 (TUBING) IMPLANT
VASC PATCH XENOSURE .8CM X 8CM (Vascular Products) ×1 IMPLANT
WATER STERILE IRR 500ML POUR (IV SOLUTION) ×1 IMPLANT

## 2023-05-07 NOTE — Progress Notes (Signed)
[name]  Daily Progress Note   Subjective  -   Doing well post op.  Neck with mild swelling.  Not requiring any IV antihypertensives.  Minimal pain.  Tolerating diet.  Objective Vitals:   05/07/23 1423 05/07/23 1500 05/07/23 1600 05/07/23 1700  BP: (!) 141/64 124/70 (!) 153/76 117/66  Pulse: (!) 52 (!) 45 (!) 53 (!) 58  Resp: 15 15    Temp:      TempSrc:      SpO2: 100% 100% 99% 98%  Weight:      Height:        Intake/Output Summary (Last 24 hours) at 05/07/2023 1745 Last data filed at 05/07/2023 1500 Gross per 24 hour  Intake 2458.63 ml  Output 260 ml  Net 2198.63 ml    PULM  CTAB CV  RRR, slightly bradycardic VASC  Neck with mild swelling, neuro exam intact  Laboratory CBC    Component Value Date/Time   WBC 11.1 (H) 04/30/2023 1332   HGB 12.8 (L) 04/30/2023 1332   HCT 37.3 (L) 04/30/2023 1332   PLT 277 04/30/2023 1332    BMET    Component Value Date/Time   NA 138 04/30/2023 1332   K 3.3 (L) 04/30/2023 1332   CL 104 04/30/2023 1332   CO2 24 04/30/2023 1332   GLUCOSE 97 04/30/2023 1332   BUN 17 04/30/2023 1332   CREATININE 1.13 04/30/2023 1332   CALCIUM 8.9 04/30/2023 1332   GFRNONAA >60 04/30/2023 1332   GFRAA >60 02/20/2020 0452    Assessment/Planning: POD #0 s/p right cEA  Doing well.  Neurologically intact. Mild right neck swelling. Vital signs are stable Diet has been advanced.  Watch in ICU overnight and possible discharge home tomorrow.    Brad Chen  05/07/2023, 5:45 PM

## 2023-05-07 NOTE — Op Note (Signed)
[facility]   OPERATIVE NOTE  PROCEDURE:   1.  Right carotid endarterectomy with bovine pericardial arterial patch reconstruction 2.  Excisional biopsy of right cervical lymph node  PRE-OPERATIVE DIAGNOSIS: 1.  High-grade right carotid stenosis 2.  History of head neck cancer  POST-OPERATIVE DIAGNOSIS: same as above   SURGEON: [name], MD  ASSISTANT(S): none  ANESTHESIA: general  ESTIMATED BLOOD LOSS: 50 cc  FINDING(S): 1.  Right carotid plaque.  SPECIMEN(S):  Carotid plaque (sent to Pathology) Cervical lymph nodes sent to pathology as well  INDICATIONS:   Brad Chen is a 62 y.o. male who presents with high-grade right carotid stenosis of rater than 70%.  I discussed with the patient the risks, benefits, and alternatives to carotid endarterectomy.  I discussed the differences between carotid stenting and carotid endarterectomy. I discussed the procedural details of carotid endarterectomy with the patient.  The patient is aware that the risks of carotid endarterectomy include but are not limited to: bleeding, infection, stroke, myocardial infarction, death, cranial nerve injuries both temporary and permanent, neck hematoma, possible airway compromise, labile blood pressure post-operatively, cerebral hyperperfusion syndrome, and possible need for additional interventions in the future. The patient is aware of the risks and agrees to proceed forward with the procedure.  DESCRIPTION: After full informed written consent was obtained from the patient, the patient was brought back to the operating room and placed supine upon the operating table.  Prior to induction, the patient received IV antibiotics.  After obtaining adequate anesthesia, the patient was placed into a modified beach chair position with a shoulder roll in place and the patient's neck slightly hyperextended and rotated away from the surgical site.  The patient was prepped in the standard fashion  for a carotid endarterectomy.  I made an incision anterior to the sternocleidomastoid muscle and dissected down through the subcutaneous tissue.  The platysmas was opened with electrocautery.  With his history of head neck cancer, the family had requested biopsy of cervical lymph nodes.  He did not have any pathologic appearing lymph nodes or any enlarged lymph nodes, but I did see 1 cervical lymph node that was easily identifiable and this was dissected out and sent to pathology as specimen.  Then I dissected down to the internal jugular vein and facial vein.  The facial vein is ligated and divided between 2-0 silk ties.  This was dissected posteriorly until I obtained visualization of the common carotid artery.  This was dissected out and then a vessel loop was placed around the common carotid artery.  I then dissected in a periadventitial fashion along the common carotid artery up to the bifurcation.  I then identified the external carotid artery and the superior thyroid artery.  I placed a vessel loop around the superior thyroid artery, and I also dissected out the external carotid artery and placed a vessel loop around it. In the process of this dissection, the hypoglossal nerve was identified and protected from harm.  I then dissected out the internal carotid artery until I identified an area in the internal carotid artery clearly above the stenosis.  I dissected slightly distal to this area, and placed a vessel loop around the artery.  At this point, we gave the patient 6000 units of intravenous heparin.  After this was allowed to circulate for several minutes, I pulled up control on the vessel loops to clamp the internal carotid artery, external carotid artery, superior thyroid artery, and then the common carotid  artery.  I then made an arteriotomy in the common carotid artery with a 11 blade, and extended the arteriotomy with a Potts scissor down into the common carotid artery, then I carried the  arteriotomy through the bifurcation into the internal carotid artery until I reached an area that was not diseased.  At this point, I took the [place] shunt that previously been prepared and I inserted it into the internal carotid artery first, and then into the common carotid artery taking care to flush and de-air prior to release of control. At this point, I started the endarterectomy in the common carotid artery with a [place] elevator and carried this dissection down into the common carotid artery circumferentially.  Then I transected the plaque at a segment where it was adherent and transected the plaque with Potts scissors.  I then carried this dissection up into the external carotid artery.  The plaque was extracted by unclamping the external carotid artery and performing an eversion endarterectomy.  The dissection was then carried into the internal carotid artery where a nice feathered end point was created with gentle traction.  I passed the plaque off the field as a specimen. At this point I removed all loose flecks and remaining disease possible.  At this point, I was satisfied that the minimal remaining disease was densely adherent to the wall and wall integrity was intact. The distal endpoint was tacked down with two 7-0 Prolene sutures.  I then fashioned a CorMatrix arterial patch for the artery and sewed it in place with two running stitch of 6-0 Prolene.  I started at the distal endpoint and ran one half the length of the arteriotomy.  I then cut and beveled the patch to an appropriate length to match the arteriotomy.  I started the second 6-0 Prolene at the proximal end point.  The medial suture line was completed and the lateral suture line was run approximately one quarter the length of the arteriotomy.  Prior to completing this patch angioplasty, I removed the shunt first from the internal carotid artery, from which there was excellent backbleeding, and clamped it.  Then I removed the  shunt from the common carotid artery, from which there was excellent antegrade bleeding, and then clamped it.  At this point, I allowed the external carotid artery to backbleed, which was excellent.  Then I instilled heparinized saline in this patched artery and then completed the patch angioplasty in the usual fashion.  First, I released the clamp on the external carotid artery, then I released it on the common carotid artery.  After waiting a few seconds, I then released it on the internal carotid artery. Several minutes of pressure were held and 6-0 Prolene patch sutures were used as need for hemostasis.  At this point, I placed Surgicel and Evicel topical hemostatic agents.  There was no more active bleeding in the surgical site.  The sternocleidomastoid space was closed with three interrupted 3-0 Vicryl sutures. I then reapproximated the platysma muscle with a running stitch of 3-0 Vicryl.  The skin was then closed with a running subcuticular 4-0 Monocryl.  The skin was then cleaned, dried and Dermabond was used to reinforce the skin closure.  The patient awakened and was taken to the recovery room in stable condition, following commands and moving all four extremities without any apparent deficits.    COMPLICATIONS: none  CONDITION: stable  [name]  05/07/2023, 10:13 AM    This note was created with [facility]  transcription system. Any errors in dictation are purely unintentional.

## 2023-05-07 NOTE — Anesthesia Preprocedure Evaluation (Signed)
Anesthesia Evaluation  Patient identified by MRN, date of birth, ID band Patient awake    Reviewed: Allergy & Precautions, NPO status , Patient's Chart, lab work & pertinent test results  History of Anesthesia Complications Negative for: history of anesthetic complications  Airway Mallampati: III  TM Distance: <3 FB Neck ROM: full    Dental  (+) Chipped   Pulmonary neg shortness of breath, Patient abstained from smoking., former smoker   Pulmonary exam normal        Cardiovascular Exercise Tolerance: Good hypertension, (-) angina + CAD  Normal cardiovascular exam     Neuro/Psych TIA negative psych ROS   GI/Hepatic negative GI ROS, Neg liver ROS,neg GERD  ,,  Endo/Other  negative endocrine ROS    Renal/GU Renal disease     Musculoskeletal   Abdominal   Peds  Hematology negative hematology ROS (+)   Anesthesia Other Findings Past Medical History: No date: Aortic atherosclerosis (HCC) 11/12/2022: CAD (coronary artery disease)     Comment:  a.) CT chest 11/12/2022: extensive CAD; b.) CT chest               02/25/2023: extensive CAD No date: Carotid arterial disease (HCC)     Comment:  a.) CT soft tissue neck 03/25/2023: severe RICA stenosis              with (+) string sign; b.) carotid doppler 04/14/2023:               60-79% RICA, 1-39% LICA No date: Cerebral microvascular disease     Comment:  a.) s/p TIA 2018 No date: DDD (degenerative disc disease), cervical     Comment:  a.) s/p C5-C6 ACDF No date: Gangrene of finger of left hand (HCC)     Comment:  a.) s.p PTA/stenting 02/13/2023 --> POBA of  LEFT               subclavian/axillary artery distal to vertebral artery; 8               x 37 mm Lifestream stent to LEFT subclavian/axillary               artery distal to the vertebral artery; 9 x 37 mm               Lifestream stent placed to LEFT subclavian artery origin. No date: H/O alcohol abuse No date:  History of DVT (deep vein thrombosis) No date: Hyperlipidemia No date: Hypertension No date: Hypocalcemia No date: Long term current use of aspirin No date: On long term clopidogrel therapy No date: Renal cyst, right     Comment:  a.) CT chest 11/12/2022: 1.3 cm 03/17/2023: Right thyroid nodule     Comment:  a.) CT soft tissue neck 03/17/2023: 7 mm 11/12/2022: Right upper lobe pulmonary nodule     Comment:  a.) CT chest 11/12/2022: 6 mm (non-solid); b.) CT chest               02/25/2023: 5 mm (non-solid); c.) CT soft tissue neck               03/17/2023: 6 mm (sub solid) 11/25/2022: Squamous cell carcinoma of oropharynx (HCC)     Comment:  a.) s/p partial pharyngectomy, small FOM lesion               resection, biodesign placement 11/25/2022 --> pathology               (+) for 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); p16 and HPV (-) No date: Status post carpal tunnel release of both wrists 01/2023: Subclavian artery stenosis, left (HCC)     Comment:  a.) s/p PTA and stenting 02/13/2023--> tandem proximal               LEFT subclavian and LEFT axillary artery stents 2018: TIA (transient ischemic attack) No date: Torus mandibularis     Comment:  a.) s/p excision 11/25/2022  Past Surgical History: 2003: ANTERIOR CERVICAL DECOMP/DISCECTOMY FUSION; N/A     Comment:  Procedure: ANTERIOR CERVICAL DECOMP/DISCECTOMY FUSION               (C5-C6) No date: CARPAL TUNNEL RELEASE; Bilateral 11/25/2022: PHARYNGECTOMY     Comment:  Procedure(s): PR PARTIAL REMOVAL OF PHARYNX  PR REMOVAL               NODES, NECK,CERV MOD RAD  PR EXCIS MOUTH MUCOSA/SUB,NO               REPAIR  LTD PHARYNGECTOMY  CERVICAL LYMPHADENECTOMY               (MODIFIED RADICAL NECK DISSECTION)  EXCISION OF LESION OF              MUCOSA AND SUBMUCOSA, VESTIBULE OF MOUTH; WITHOUT REPAIR 07/22/2022: SHOULDER ARTHROSCOPY WITH  SUBACROMIAL DECOMPRESSION AND  OPEN ROTATOR C; Left     Comment:  Procedure: Left shoulder arthroscopic subscapularis               repair, mini open rotator cuff repair vs reconstruction               with allograft, subacromial decompression, distal               clavicle excision, and biceps tenodesis;  Surgeon: Signa Kell, MD;  Location: [facility] ORS;  Service: Orthopedics;                Laterality: Left; 02/13/2023: UPPER EXTREMITY ANGIOGRAPHY; Left     Comment:  Procedure: Upper Extremity Angiography;  Surgeon: Annice Needy, MD;  Location: [facility] INVASIVE CV LAB;  Service:               Cardiovascular;  Laterality: Left;  BMI    Body Mass Index: 24.12 kg/m      Reproductive/Obstetrics negative OB ROS                             Anesthesia Physical Anesthesia Plan  ASA: 3  Anesthesia Plan: General ETT   Post-op Pain Management:    Induction: Intravenous  PONV Risk Score and Plan: Ondansetron, Dexamethasone, Midazolam and Treatment may vary due to age or medical condition  Airway Management Planned: Oral ETT  Additional Equipment: Arterial line  Intra-op Plan:   Post-operative Plan: Extubation in OR  Informed Consent: I have reviewed the patients History and Physical, chart, labs and discussed the procedure including the risks, benefits and alternatives for the proposed anesthesia with the patient or authorized representative who has indicated his/her understanding and acceptance.     Dental  Advisory Given  Plan Discussed with: Anesthesiologist, CRNA and Surgeon  Anesthesia Plan Comments: (Patient consented for risks of anesthesia including but not limited to:  - adverse reactions to medications - damage to eyes, teeth, lips or other oral mucosa - nerve damage due to positioning  - sore throat or hoarseness - Damage to heart, brain, nerves, lungs, other parts of body or loss of life  Patient voiced  understanding.)       Anesthesia Quick Evaluation

## 2023-05-07 NOTE — Transfer of Care (Signed)
Immediate Anesthesia Transfer of Care Note  Patient: Brad Chen  Procedure(s) Performed: ENDARTERECTOMY CAROTID (Right: Neck)  Patient Location: PACU  Anesthesia Type:General  Level of Consciousness: awake  Airway & Oxygen Therapy: Patient Spontanous Breathing and Patient connected to face mask oxygen  Post-op Assessment: Report given to RN and Post -op Vital signs reviewed and stable  Post vital signs: Reviewed and stable  Last Vitals:  Vitals Value Taken Time  BP 127/61 05/07/23 1011  Temp    Pulse 56 05/07/23 1015  Resp 13 05/07/23 1015  SpO2 96 % 05/07/23 1015  Vitals shown include unfiled device data.  Last Pain:  Vitals:   05/07/23 0629  TempSrc: Temporal  PainSc: 0-No pain      Patients Stated Pain Goal: 0 (05/07/23 0629)  Complications: No notable events documented.

## 2023-05-07 NOTE — Anesthesia Postprocedure Evaluation (Signed)
Anesthesia Post Note  Patient: Brad Chen  Procedure(s) Performed: ENDARTERECTOMY CAROTID (Right: Neck)  Patient location during evaluation: PACU Anesthesia Type: General Level of consciousness: awake and alert Pain management: pain level controlled Vital Signs Assessment: post-procedure vital signs reviewed and stable Respiratory status: spontaneous breathing, nonlabored ventilation, respiratory function stable and patient connected to nasal cannula oxygen Cardiovascular status: blood pressure returned to baseline and stable Postop Assessment: no apparent nausea or vomiting Anesthetic complications: no   No notable events documented.   Last Vitals:  Vitals:   05/07/23 1200 05/07/23 1230  BP: (!) 154/64 (!) 140/66  Pulse: (!) 48 (!) 46  Resp: 14 15  Temp:    SpO2: 98% 99%    Last Pain:  Vitals:   05/07/23 1200  TempSrc:   PainSc: Asleep                 Cleda Mccreedy Goldye Tourangeau

## 2023-05-07 NOTE — Interval H&P Note (Signed)
History and Physical Interval Note:  05/07/2023 7:28 AM  Brad Chen  has presented today for surgery, with the diagnosis of CAROTID STENOSIS.  The various methods of treatment have been discussed with the patient and family. After consideration of risks, benefits and other options for treatment, the patient has consented to  Procedure(s): ENDARTERECTOMY CAROTID (Right) as a surgical intervention.  The patient's history has been reviewed, patient examined, no change in status, stable for surgery.  I have reviewed the patient's chart and labs.  Questions were answered to the patient's satisfaction.     [name]

## 2023-05-07 NOTE — Anesthesia Procedure Notes (Signed)
Procedure Name: Intubation Date/Time: 05/07/2023 7:40 AM  Performed by: Rosaria Ferries, MDPre-anesthesia Checklist: Patient identified, Emergency Drugs available, Suction available and Patient being monitored Patient Re-evaluated:Patient Re-evaluated prior to induction Oxygen Delivery Method: Circle system utilized Preoxygenation: Pre-oxygenation with 100% oxygen Induction Type: IV induction Ventilation: Mask ventilation without difficulty Laryngoscope Size: McGraph and 4 Grade View: Grade I Tube type: Oral Tube size: 7.5 mm Number of attempts: 1 Airway Equipment and Method: Stylet Placement Confirmation: ETT inserted through vocal cords under direct vision, positive ETCO2 and breath sounds checked- equal and bilateral Secured at: 23 cm Tube secured with: Tape Dental Injury: Teeth and Oropharynx as per pre-operative assessment  Comments: Inserted by Hassel Neth

## 2023-05-08 LAB — CBC
HCT: 32.5 % — ABNORMAL LOW (ref 39.0–52.0)
Hemoglobin: 11 g/dL — ABNORMAL LOW (ref 13.0–17.0)
MCH: 32.5 pg (ref 26.0–34.0)
MCHC: 33.8 g/dL (ref 30.0–36.0)
MCV: 96.2 fL (ref 80.0–100.0)
Platelets: 224 10*3/uL (ref 150–400)
RBC: 3.38 MIL/uL — ABNORMAL LOW (ref 4.22–5.81)
RDW: 13.7 % (ref 11.5–15.5)
WBC: 13.4 10*3/uL — ABNORMAL HIGH (ref 4.0–10.5)
nRBC: 0 % (ref 0.0–0.2)

## 2023-05-08 LAB — BASIC METABOLIC PANEL
Anion gap: 6 (ref 5–15)
BUN: 18 mg/dL (ref 8–23)
CO2: 23 mmol/L (ref 22–32)
Calcium: 7.8 mg/dL — ABNORMAL LOW (ref 8.9–10.3)
Chloride: 110 mmol/L (ref 98–111)
Creatinine, Ser: 1.37 mg/dL — ABNORMAL HIGH (ref 0.61–1.24)
GFR, Estimated: 58 mL/min — ABNORMAL LOW (ref >60)
Glucose, Bld: 127 mg/dL — ABNORMAL HIGH (ref 70–99)
Potassium: 4 mmol/L (ref 3.5–5.1)
Sodium: 139 mmol/L (ref 135–145)

## 2023-05-08 MED ORDER — OXYCODONE-ACETAMINOPHEN 5-325 MG PO TABS: 1.0000 | ORAL_TABLET | Freq: Four times a day (QID) | ORAL | 0 refills | Status: DC | PRN

## 2023-05-08 NOTE — Progress Notes (Signed)
[facility]  Daily Progress Note   Subjective  -   No major events overnight.  Sleeping well.  Mild neck swelling is stable  Objective Vitals:   05/08/23 0400 05/08/23 0500 05/08/23 0600 05/08/23 0700  BP: (!) 126/53 138/62  (!) 137/57  Pulse: 63 69 (!) 49 (!) 52  Resp: (!) 22 20 11 14   Temp: 98.9 F (37.2 C)     TempSrc: Axillary     SpO2: 100% 100% 100% 99%  Weight:      Height:        Intake/Output Summary (Last 24 hours) at 05/08/2023 0841 Last data filed at 05/08/2023 0827 Gross per 24 hour  Intake 3874.43 ml  Output 2615 ml  Net 1259.43 ml    PULM  CTAB CV  Somewhat bradycardic. VASC  mild swelling in the right neck which is stable overnight.  Neurologic exam remains normal.  Laboratory CBC    Component Value Date/Time   WBC 13.4 (H) 05/08/2023 0238   HGB 11.0 (L) 05/08/2023 0238   HCT 32.5 (L) 05/08/2023 0238   PLT 224 05/08/2023 0238    BMET    Component Value Date/Time   NA 139 05/08/2023 0238   K 4.0 05/08/2023 0238   CL 110 05/08/2023 0238   CO2 23 05/08/2023 0238   GLUCOSE 127 (H) 05/08/2023 0238   BUN 18 05/08/2023 0238   CREATININE 1.37 (H) 05/08/2023 0238   CALCIUM 7.8 (L) 05/08/2023 0238   GFRNONAA 58 (L) 05/08/2023 0238   GFRAA >60 02/20/2020 0452    Assessment/Planning: POD #1 s/p right CEA  Doing well this morning No major events overnight Remove Foley catheter this morning Saline lock IV Ambulate with assistance Possibly discharge home later today   [name]  05/08/2023, 8:41 AM

## 2023-05-08 NOTE — Discharge Summary (Signed)
[facility]    Discharge Summary    Patient ID:  Brad Chen MRN: [id number] DOB/AGE: 63/20/1962 62 y.o.  Admit date: 05/07/2023 Discharge date: 05/08/2023 Date of Surgery: 05/07/2023 Surgeon: Surgeon(s): Annice Needy, MD  Admission Diagnosis: Carotid stenosis, right [I65.21]  Discharge Diagnoses:  Carotid stenosis, right [I65.21]  Secondary Diagnoses: Past Medical History:  Diagnosis Date   Aortic atherosclerosis (HCC)    CAD (coronary artery disease) 11/12/2022   a.) CT chest 11/12/2022: extensive CAD; b.) CT chest 02/25/2023: extensive CAD   Carotid arterial disease (HCC)    a.) CT soft tissue neck 03/25/2023: severe RICA stenosis with (+) string sign; b.) carotid doppler 04/14/2023: 60-79% RICA, 1-39% LICA   Cerebral microvascular disease    a.) s/p TIA 2018   DDD (degenerative disc disease), cervical    a.) s/p C5-C6 ACDF   Gangrene of finger of left hand (HCC)    a.) s.p PTA/stenting 02/13/2023 --> POBA of  LEFT subclavian/axillary artery distal to vertebral artery; 8 x 37 mm Lifestream stent to LEFT subclavian/axillary artery distal to the vertebral artery; 9 x 37 mm Lifestream stent placed to LEFT subclavian artery origin.   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   a.) s/p partial pharyngectomy, small FOM lesion resection, biodesign placement 11/25/2022 --> pathology (+) for moderately differentiating kertinizing  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  Due to the patient's highly calcific lesion, carotid endarterectomy was the chosen versus carotid stenting.  The patient ultimately tolerated the procedure well.  He had minimal neck pain and some mild swelling.  He has been tolerating his diet well.  He has been able to walk and ambulate and urinate without significant issue post Foley removal.  The patient remains neurologically intact.  Hospital Course:  Brad Chen is a 62 y.o. male is S/P Right Carotid Endarterectomy  Extubated: POD # 0 Physical Exam:  Alert notes x3, no acute distress Face: Symmetrical.  Tongue is midline. Neck: Trachea is midline.  No swelling or bruising. Incision Intact  Cardiovascular: Regular rate and rhythm Pulmonary: Clear to auscultation bilaterally Abdomen: Soft, nontender, nondistended Right groin access: Clean dry and intact.  No swelling or drainage noted Left groin access: Clean dry and intact.  No swelling or drainage noted Left lower extremity: Thigh  soft.  Calf soft.  Extremities warm distally toes.  Hard to palpate pedal pulses however the foot is warm is her good capillary refill. Right lower extremity: Thigh soft.  Calf soft.  Extremities warm distally toes.  Hard to palpate pedal pulses however the foot is warm is her good capillary refill. Neurological: No deficits noted   Post-op wounds:  clean, dry,  intact or healing well  Pt. Ambulating, voiding and taking PO diet without difficulty. Pt pain controlled with PO pain meds.  Labs:  As below  Complications: none  Consults:    Significant Diagnostic Studies: CBC Lab Results  Component Value Date   WBC 13.4 (H) 05/08/2023   HGB 11.0 (L) 05/08/2023   HCT 32.5 (L) 05/08/2023   MCV 96.2 05/08/2023   PLT 224 05/08/2023    BMET    Component Value Date/Time   NA 139 05/08/2023 0238   K 4.0 05/08/2023 0238   CL 110 05/08/2023 0238   CO2 23 05/08/2023 0238   GLUCOSE 127 (H) 05/08/2023 0238   BUN 18 05/08/2023 0238   CREATININE 1.37 (H) 05/08/2023 0238   CALCIUM 7.8 (L) 05/08/2023 0238   GFRNONAA 58 (L) 05/08/2023 0238   GFRAA >60 02/20/2020 0452   COAG Lab Results  Component Value Date   INR 0.9 02/20/2020   INR 0.9 02/19/2020     Disposition:  Discharge to :Home  Allergies as of 05/08/2023   No Known Allergies      Medication List     TAKE these medications    aspirin EC 81 MG tablet Take 1 tablet (81 mg total) by mouth daily. Swallow whole.   atorvastatin 10 MG tablet Commonly known as: Lipitor Take 1 tablet (10 mg total) by mouth daily.   clopidogrel 75 MG tablet Commonly known as: Plavix Take 1 tablet (75 mg total) by mouth daily.   MULTIVITAMIN ADULTS PO Take 1 tablet by mouth See admin instructions. When he remembers   oxyCODONE-acetaminophen 5-325 MG tablet Commonly known as: Percocet Take 1-2 tablets by mouth every 6 (six) hours as needed for severe pain.   VITAMIN C PO Take 1 tablet by mouth See admin instructions. Takes when he remembers       Verbal and written Discharge instructions given to the patient. Wound care per Discharge AVS   Signed: Georgiana Spinner, NP  05/08/2023, 4:01 PM

## 2023-05-08 NOTE — Plan of Care (Signed)

## 2023-05-12 ENCOUNTER — Encounter (INDEPENDENT_AMBULATORY_CARE_PROVIDER_SITE_OTHER): Payer: Self-pay | Admitting: Vascular Surgery

## 2023-05-14 ENCOUNTER — Encounter: Payer: Self-pay | Admitting: Vascular Surgery

## 2023-05-26 ENCOUNTER — Other Ambulatory Visit: Payer: Self-pay | Admitting: Physician Assistant

## 2023-05-26 DIAGNOSIS — Z720 Tobacco use: Secondary | ICD-10-CM

## 2023-05-26 DIAGNOSIS — I739 Peripheral vascular disease, unspecified: Secondary | ICD-10-CM

## 2023-05-26 DIAGNOSIS — I2510 Atherosclerotic heart disease of native coronary artery without angina pectoris: Secondary | ICD-10-CM

## 2023-05-26 DIAGNOSIS — E785 Hyperlipidemia, unspecified: Secondary | ICD-10-CM

## 2023-05-28 ENCOUNTER — Other Ambulatory Visit (INDEPENDENT_AMBULATORY_CARE_PROVIDER_SITE_OTHER): Payer: Self-pay | Admitting: Vascular Surgery

## 2023-05-28 ENCOUNTER — Encounter (HOSPITAL_COMMUNITY): Payer: Self-pay

## 2023-05-28 DIAGNOSIS — I771 Stricture of artery: Secondary | ICD-10-CM

## 2023-05-28 DIAGNOSIS — I6523 Occlusion and stenosis of bilateral carotid arteries: Secondary | ICD-10-CM

## 2023-05-29 ENCOUNTER — Telehealth (HOSPITAL_COMMUNITY): Payer: Self-pay | Admitting: *Deleted

## 2023-05-29 NOTE — Telephone Encounter (Signed)
Reaching out to patient to offer assistance regarding upcoming cardiac imaging study; pt verbalizes understanding of appt date/time, parking situation and where to check in, pre-test NPO status  and verified current allergies; name and call back number provided for further questions should they arise ? ?Larey Brick RN Navigator Cardiac Imaging ?[facility] ?[phone] office ?[phone] cell ? ?

## 2023-06-01 ENCOUNTER — Ambulatory Visit
Admission: RE | Admit: 2023-06-01 | Discharge: 2023-06-01 | Disposition: A | Discharge status: Home / Self Care | Source: Ambulatory Visit | Attending: Physician Assistant | Admitting: Physician Assistant

## 2023-06-01 DIAGNOSIS — I739 Peripheral vascular disease, unspecified: Secondary | ICD-10-CM

## 2023-06-01 DIAGNOSIS — Z720 Tobacco use: Secondary | ICD-10-CM

## 2023-06-01 DIAGNOSIS — E785 Hyperlipidemia, unspecified: Secondary | ICD-10-CM

## 2023-06-01 DIAGNOSIS — I2510 Atherosclerotic heart disease of native coronary artery without angina pectoris: Secondary | ICD-10-CM

## 2023-06-01 MED ORDER — NITROGLYCERIN 0.4 MG SL SUBL
0.8000 mg | SUBLINGUAL_TABLET | Freq: Once | SUBLINGUAL | Status: DC
  Filled 2023-06-01: qty 25

## 2023-06-01 MED ORDER — IOHEXOL 350 MG/ML SOLN: 80.0000 mL | Freq: Once | INTRAVENOUS | Status: DC | PRN

## 2023-06-01 NOTE — Progress Notes (Signed)
Unable to perform scan today due to elevated Hr in 100s. Pt stated he had to park a really far way away and then walk to the medical mall. Nurse Navigator informed of needing to reschedule pt and pt informed to park at [facility] so he won't have to walk so far for his next appt.  Pt verbalized understanding.

## 2023-06-04 ENCOUNTER — Ambulatory Visit (INDEPENDENT_AMBULATORY_CARE_PROVIDER_SITE_OTHER): Admitting: Nurse Practitioner

## 2023-06-04 ENCOUNTER — Encounter (INDEPENDENT_AMBULATORY_CARE_PROVIDER_SITE_OTHER): Payer: Self-pay | Admitting: Nurse Practitioner

## 2023-06-04 ENCOUNTER — Ambulatory Visit (INDEPENDENT_AMBULATORY_CARE_PROVIDER_SITE_OTHER)

## 2023-06-04 VITALS — BP 190/92 | HR 80 | Resp 16 | Wt 142.4 lb

## 2023-06-04 DIAGNOSIS — I771 Stricture of artery: Secondary | ICD-10-CM

## 2023-06-04 DIAGNOSIS — I6523 Occlusion and stenosis of bilateral carotid arteries: Secondary | ICD-10-CM

## 2023-06-04 DIAGNOSIS — I10 Essential (primary) hypertension: Secondary | ICD-10-CM

## 2023-06-04 DIAGNOSIS — E785 Hyperlipidemia, unspecified: Secondary | ICD-10-CM

## 2023-06-05 ENCOUNTER — Telehealth (HOSPITAL_COMMUNITY): Payer: Self-pay | Admitting: *Deleted

## 2023-06-05 ENCOUNTER — Encounter (INDEPENDENT_AMBULATORY_CARE_PROVIDER_SITE_OTHER): Payer: Self-pay | Admitting: Nurse Practitioner

## 2023-06-05 MED ORDER — METOPROLOL TARTRATE 50 MG PO TABS: ORAL_TABLET | ORAL | 0 refills | Status: DC

## 2023-06-05 NOTE — Progress Notes (Unsigned)
Subjective:    Patient ID: Brad Chen, male    DOB: 07/25/1961, 62 y.o.   MRN: [id number] Chief Complaint  Patient presents with  . Routine Post Op    [facility] 4 week with carotid    HPI  Review of Systems     Objective:   Physical Exam  BP (!) 190/92 (BP Location: Left Arm)   Pulse 80   Resp 16   Wt 142 lb 6.4 oz (64.6 kg)   BMI 21.65 kg/m   Past Medical History:  Diagnosis Date  . Aortic atherosclerosis (HCC)   . CAD (coronary artery disease) 11/12/2022   a.) CT chest 11/12/2022: extensive CAD; b.) CT chest 02/25/2023: extensive CAD  . Carotid arterial disease (HCC)    a.) CT soft tissue neck 03/25/2023: severe RICA stenosis with (+) string sign; b.) carotid doppler 04/14/2023: 60-79% RICA, 1-39% LICA  . Cerebral microvascular disease    a.) s/p TIA 2018  . DDD (degenerative disc disease), cervical    a.) s/p C5-C6 ACDF  . 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  . H/O alcohol abuse   . History of DVT (deep vein thrombosis)   . Hyperlipidemia   . Hypertension   . Hypocalcemia   . Long term current use of aspirin   . On long term clopidogrel therapy   . Renal cyst, right    a.) CT chest 11/12/2022: 1.3 cm  . Right thyroid nodule 03/17/2023   a.) CT soft tissue neck 03/17/2023: 7 mm  . Right upper lobe pulmonary nodule 11/12/2022   a.) CT chest 11/12/2022: 6 mm (non-solid); b.) CT chest 02/25/2023: 5 mm (non-solid); c.) CT soft tissue neck 03/17/2023: 6 mm (sub solid)  . 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 Status post carpal tunnel release of both wrists   . Subclavian artery stenosis, left (HCC) 01/2023   a.) s/p PTA and stenting 02/13/2023--> tandem proximal LEFT subclavian and LEFT axillary artery stents  . TIA (transient ischemic attack) 2018  . Torus mandibularis    a.) s/p excision 11/25/2022    Social History   Socioeconomic History  . Marital status: Married    Spouse name: Not on file  . Number of children: Not on file  . Years of education: Not on file  . Highest education level: Not on file  Occupational History  . Not on file  Tobacco Use  . Smoking status: Former    Average packs/day: 1 pack/day for 33.0 years (33.0 ttl pk-yrs)    Types: Cigarettes    Start date: 21  . Smokeless tobacco: Never  Vaping Use  . Vaping status: Never Used  Substance and Sexual Activity  . Alcohol use: Not Currently    Comment: stopped 12-2022 when dx with head/neck cancer  . Drug use: Never  . Sexual activity: Not on file  Other Topics Concern  . Not on file  Social History Narrative  . Not on file   Social Determinants of Health   Financial  Resource Strain: Not on file  Food Insecurity: No Food Insecurity (05/07/2023)   Hunger Vital Sign   . Worried About Programme researcher, broadcasting/film/video in the Last Year: Never true   . Ran Out of Food in the Last Year: Never true  Transportation Needs: No Transportation Needs (05/07/2023)   PRAPARE - Transportation   . Lack of Transportation (Medical): No   . Lack of Transportation (Non-Medical): No  Physical Activity: Not on file  Stress: Not on file  Social Connections: Not on file  Intimate Partner Violence: Not At Risk (05/07/2023)   Humiliation, Afraid, Rape, and Kick questionnaire   . Fear of Current or Ex-Partner: No   . Emotionally Abused: No   . Physically Abused: No   . Sexually Abused: No    Past Surgical History:  Procedure Laterality Date  . ANTERIOR CERVICAL  DECOMP/DISCECTOMY FUSION N/A 2003   Procedure: ANTERIOR CERVICAL DECOMP/DISCECTOMY FUSION (C5-C6)  . CARPAL TUNNEL RELEASE Bilateral   . ENDARTERECTOMY Right 05/07/2023   Procedure: ENDARTERECTOMY CAROTID;  Surgeon: Annice Needy, MD;  Location: [facility] ORS;  Service: Vascular;  Laterality: Right;  . PHARYNGECTOMY  11/25/2022   Procedure(s): PR PARTIAL REMOVAL OF PHARYNX  PR REMOVAL NODES, NECK,CERV MOD RAD  PR EXCIS MOUTH MUCOSA/SUB,NO REPAIR  LTD PHARYNGECTOMY  CERVICAL LYMPHADENECTOMY (MODIFIED RADICAL NECK DISSECTION)  EXCISION OF LESION OF MUCOSA AND SUBMUCOSA, VESTIBULE OF MOUTH; WITHOUT REPAIR  . SHOULDER ARTHROSCOPY WITH SUBACROMIAL DECOMPRESSION AND OPEN ROTATOR C Left 07/22/2022   Procedure: Left shoulder arthroscopic subscapularis repair, mini open rotator cuff repair vs reconstruction with allograft, subacromial decompression, distal clavicle excision, and biceps tenodesis;  Surgeon: Signa Kell, MD;  Location: [facility] ORS;  Service: Orthopedics;  Laterality: Left;  . UPPER EXTREMITY ANGIOGRAPHY Left 02/13/2023   Procedure: Upper Extremity Angiography;  Surgeon: Annice Needy, MD;  Location: [facility] IN[facility]IVE [facility];  Service: Cardiovascular;  Laterality: Left;    Family History  Problem Relation Age of Onset  . Hypertension Mother     No Known Allergies     Latest Ref Rng & Units 05/08/2023    2:38 AM 04/30/2023    1:32 PM 01/27/2023    5:08 PM  CBC  WBC 4.0 - 10.5 K/uL 13.4  11.1  11.9   Hemoglobin 13.0 - 17.0 g/dL 95.2  84.1  32.4   Hematocrit 39.0 - 52.0 % 32.5  37.3  41.4   Platelets 150 - 400 K/uL 224  277  225       CMP     Component Value Date/Time   NA 139 05/08/2023 0238   K 4.0 05/08/2023 0238   CL 110 05/08/2023 0238   CO2 23 05/08/2023 0238   GLUCOSE 127 (H) 05/08/2023 0238   BUN 18 05/08/2023 0238   CREATININE 1.37 (H) 05/08/2023 0238   CALCIUM 7.8 (L) 05/08/2023 0238   PROT 6.8 01/27/2023 1950   ALBUMIN 3.7 01/27/2023 1950   AST 21 01/27/2023 1950   ALT 28  01/27/2023 1950   ALKPHOS 68 01/27/2023 1950   BILITOT 0.7 01/27/2023 1950   GFRNONAA 58 (L) 05/08/2023 0238     No results found.     Assessment & Plan:   1. Bilateral carotid artery stenosis ***  2. Stenosis of left subclavian artery (HCC) ***  3. Primary hypertension ***   Current Outpatient Medications on File Prior to Visit  Medication Sig Dispense Refill  . Ascorbic Acid (VITAMIN C PO) Take 1 tablet by mouth See admin instructions. Takes  when he remembers    . aspirin EC 81 MG tablet Take 1 tablet (81 mg total) by mouth daily. Swallow whole. 150 tablet 2  . atorvastatin (LIPITOR) 10 MG tablet Take 1 tablet (10 mg total) by mouth daily. 30 tablet 11  . clopidogrel (PLAVIX) 75 MG tablet Take 1 tablet (75 mg total) by mouth daily. 30 tablet 11  . Multiple Vitamins-Minerals (MULTIVITAMIN ADULTS PO) Take 1 tablet by mouth See admin instructions. When he remembers    . oxyCODONE-acetaminophen (PERCOCET) 5-325 MG tablet Take 1-2 tablets by mouth every 6 (six) hours as needed for severe pain. (Patient not taking: Reported on 06/04/2023) 30 tablet 0   No current facility-administered medications on file prior to visit.    There are no Patient Instructions on file for this visit. No follow-ups on file.   Georgiana Spinner, NP

## 2023-06-05 NOTE — Telephone Encounter (Signed)
Reaching out to patient to offer assistance regarding upcoming cardiac imaging study; pt verbalizes understanding of appt date/time, parking situation and where to check in, pre-test NPO status and medications ordered, and verified current allergies; name and call back number provided for further questions should they arise Johney Frame RN Navigator Cardiac Imaging [facility] [phone] office [phone] cell   Patient reports BP 137/101 HR 74.  Patient aware to take metoprolol 2 hours prior to CT.  Medication sent to preferred pharmacy.

## 2023-06-08 ENCOUNTER — Other Ambulatory Visit: Payer: Self-pay | Admitting: Physician Assistant

## 2023-06-08 ENCOUNTER — Ambulatory Visit
Admission: RE | Admit: 2023-06-08 | Discharge: 2023-06-08 | Disposition: A | Discharge status: Home / Self Care | Source: Ambulatory Visit | Attending: Physician Assistant | Admitting: Physician Assistant

## 2023-06-08 DIAGNOSIS — E785 Hyperlipidemia, unspecified: Secondary | ICD-10-CM

## 2023-06-08 DIAGNOSIS — I739 Peripheral vascular disease, unspecified: Secondary | ICD-10-CM

## 2023-06-08 DIAGNOSIS — I2510 Atherosclerotic heart disease of native coronary artery without angina pectoris: Secondary | ICD-10-CM

## 2023-06-08 DIAGNOSIS — Z720 Tobacco use: Secondary | ICD-10-CM | POA: Diagnosis present

## 2023-06-08 DIAGNOSIS — R931 Abnormal findings on diagnostic imaging of heart and coronary circulation: Secondary | ICD-10-CM

## 2023-06-08 MED ORDER — NITROGLYCERIN 0.4 MG SL SUBL
0.8000 mg | SUBLINGUAL_TABLET | Freq: Once | SUBLINGUAL | Status: AC
  Administered 2023-06-08: 0.8 mg via SUBLINGUAL
  Filled 2023-06-08: qty 25

## 2023-06-08 MED ORDER — IOHEXOL 350 MG/ML SOLN
100.0000 mL | Freq: Once | INTRAVENOUS | Status: AC | PRN
  Administered 2023-06-08: 80 mL via INTRAVENOUS

## 2023-06-08 NOTE — Progress Notes (Signed)
Patient tolerated procedure well. Ambulate w/o difficulty. Denies any lightheadedness or being dizzy. Pt denies any pain at this time. Sitting in chair. Pt is encouraged to drink additional water throughout the day and reason explained to patient. Patient verbalized understanding and all questions answered. ABC intact. No further needs at this time. Discharge from procedure area w/o issues. 

## 2023-06-18 ENCOUNTER — Ambulatory Visit
Admission: RE | Admit: 2023-06-18 | Discharge: 2023-06-18 | Disposition: A | Discharge status: Home / Self Care | Source: Ambulatory Visit | Attending: Internal Medicine | Admitting: Internal Medicine

## 2023-06-18 ENCOUNTER — Other Ambulatory Visit: Payer: Self-pay

## 2023-06-18 ENCOUNTER — Encounter: Payer: Self-pay | Admitting: Internal Medicine

## 2023-06-18 ENCOUNTER — Encounter
Admission: RE | Disposition: A | Discharge status: Home / Self Care | Payer: Self-pay | Source: Ambulatory Visit | Attending: Internal Medicine

## 2023-06-18 DIAGNOSIS — I2510 Atherosclerotic heart disease of native coronary artery without angina pectoris: Secondary | ICD-10-CM | POA: Diagnosis present

## 2023-06-18 DIAGNOSIS — I2584 Coronary atherosclerosis due to calcified coronary lesion: Secondary | ICD-10-CM | POA: Diagnosis not present

## 2023-06-18 HISTORY — PX: LEFT HEART CATH AND CORONARY ANGIOGRAPHY: CATH118249

## 2023-06-18 SURGERY — LEFT HEART CATH AND CORONARY ANGIOGRAPHY
Anesthesia: Moderate Sedation | Laterality: Left

## 2023-06-18 MED ORDER — HEPARIN SODIUM (PORCINE) 1000 UNIT/ML IJ SOLN
INTRAMUSCULAR | Status: DC | PRN
  Administered 2023-06-18: 3000 [IU] via INTRAVENOUS

## 2023-06-18 MED ORDER — HEPARIN (PORCINE) IN NACL 1000-0.9 UT/500ML-% IV SOLN
INTRAVENOUS | Status: DC | PRN
  Administered 2023-06-18: 1000 mL

## 2023-06-18 MED ORDER — ONDANSETRON HCL 4 MG/2ML IJ SOLN: 4.0000 mg | Freq: Four times a day (QID) | INTRAMUSCULAR | Status: DC | PRN

## 2023-06-18 MED ORDER — VERAPAMIL HCL 2.5 MG/ML IV SOLN
INTRAVENOUS | Status: AC
  Filled 2023-06-18: qty 2

## 2023-06-18 MED ORDER — SODIUM CHLORIDE 0.9 % IV SOLN: 250.0000 mL | INTRAVENOUS | Status: DC | PRN

## 2023-06-18 MED ORDER — SODIUM CHLORIDE 0.9 % WEIGHT BASED INFUSION
3.0000 mL/kg/h | INTRAVENOUS | Status: AC
  Administered 2023-06-18: 3 mL/kg/h via INTRAVENOUS

## 2023-06-18 MED ORDER — SODIUM CHLORIDE 0.9 % WEIGHT BASED INFUSION: 1.0000 mL/kg/h | INTRAVENOUS | Status: DC

## 2023-06-18 MED ORDER — FENTANYL CITRATE (PF) 100 MCG/2ML IJ SOLN
INTRAMUSCULAR | Status: AC
  Filled 2023-06-18: qty 2

## 2023-06-18 MED ORDER — SODIUM CHLORIDE 0.9% FLUSH: 3.0000 mL | Freq: Two times a day (BID) | INTRAVENOUS | Status: DC

## 2023-06-18 MED ORDER — HEPARIN (PORCINE) IN NACL 1000-0.9 UT/500ML-% IV SOLN
INTRAVENOUS | Status: AC
  Filled 2023-06-18: qty 1000

## 2023-06-18 MED ORDER — HYDRALAZINE HCL 20 MG/ML IJ SOLN: 10.0000 mg | INTRAMUSCULAR | Status: DC | PRN

## 2023-06-18 MED ORDER — LABETALOL HCL 5 MG/ML IV SOLN: 10.0000 mg | INTRAVENOUS | Status: DC | PRN

## 2023-06-18 MED ORDER — SODIUM CHLORIDE 0.9% FLUSH
3.0000 mL | Freq: Two times a day (BID) | INTRAVENOUS | Status: DC
  Administered 2023-06-18: 3 mL via INTRAVENOUS

## 2023-06-18 MED ORDER — SODIUM CHLORIDE 0.9% FLUSH: 3.0000 mL | INTRAVENOUS | Status: DC | PRN

## 2023-06-18 MED ORDER — IOHEXOL 300 MG/ML  SOLN
INTRAMUSCULAR | Status: DC | PRN
  Administered 2023-06-18: 70 mL

## 2023-06-18 MED ORDER — FENTANYL CITRATE (PF) 100 MCG/2ML IJ SOLN
INTRAMUSCULAR | Status: DC | PRN
  Administered 2023-06-18: 50 ug via INTRAVENOUS

## 2023-06-18 MED ORDER — HEPARIN SODIUM (PORCINE) 1000 UNIT/ML IJ SOLN
INTRAMUSCULAR | Status: AC
  Filled 2023-06-18: qty 10

## 2023-06-18 MED ORDER — ACETAMINOPHEN 325 MG PO TABS: 650.0000 mg | ORAL_TABLET | ORAL | Status: DC | PRN

## 2023-06-18 MED ORDER — MIDAZOLAM HCL 2 MG/2ML IJ SOLN
INTRAMUSCULAR | Status: DC | PRN
  Administered 2023-06-18: 1 mg via INTRAVENOUS

## 2023-06-18 MED ORDER — ASPIRIN 81 MG PO CHEW: 81.0000 mg | CHEWABLE_TABLET | ORAL | Status: DC

## 2023-06-18 MED ORDER — MIDAZOLAM HCL 2 MG/2ML IJ SOLN
INTRAMUSCULAR | Status: AC
  Filled 2023-06-18: qty 2

## 2023-06-18 MED ORDER — VERAPAMIL HCL 2.5 MG/ML IV SOLN
INTRAVENOUS | Status: DC | PRN
  Administered 2023-06-18: 2.5 mg via INTRAVENOUS

## 2023-06-18 SURGICAL SUPPLY — 13 items
CATH 5FR JL3.5 JR4 ANG PIG MP (CATHETERS) IMPLANT
DEVICE RAD TR BAND REGULAR (VASCULAR PRODUCTS) IMPLANT
DRAPE BRACHIAL (DRAPES) IMPLANT
GLIDESHEATH SLEND SS 6F .021 (SHEATH) IMPLANT
GUIDEWIRE INQWIRE 1.5J.035X260 (WIRE) IMPLANT
INQWIRE 1.5J .035X260CM (WIRE) ×1
KIT SYRINGE INJ CVI SPIKEX1 (MISCELLANEOUS) IMPLANT
NDL PERC 21GX4CM (NEEDLE) IMPLANT
NEEDLE PERC 21GX4CM (NEEDLE) ×1 IMPLANT
PACK CARDIAC CATH (CUSTOM PROCEDURE TRAY) ×1 IMPLANT
PROTECTION STATION PRESSURIZED (MISCELLANEOUS) ×1
SET ATX-X65L (MISCELLANEOUS) IMPLANT
STATION PROTECTION PRESSURIZED (MISCELLANEOUS) IMPLANT

## 2023-06-22 ENCOUNTER — Encounter: Payer: Self-pay | Admitting: Internal Medicine

## 2023-06-22 LAB — CARDIAC CATHETERIZATION: Cath EF Quantitative: 55 %

## 2023-07-06 DIAGNOSIS — Z951 Presence of aortocoronary bypass graft: Secondary | ICD-10-CM | POA: Insufficient documentation

## 2023-07-28 ENCOUNTER — Encounter: Payer: PRIVATE HEALTH INSURANCE | Attending: Internal Medicine | Admitting: *Deleted

## 2023-07-28 DIAGNOSIS — Z951 Presence of aortocoronary bypass graft: Secondary | ICD-10-CM | POA: Insufficient documentation

## 2023-07-28 NOTE — Progress Notes (Signed)
Initial phone call completed. Diagnosis can be found in [facility] 10/20. EP Orientation scheduled for Thursday 11/14 at 9am.

## 2023-07-30 ENCOUNTER — Ambulatory Visit: Payer: PRIVATE HEALTH INSURANCE

## 2023-08-05 VITALS — Ht 69.5 in | Wt 146.7 lb

## 2023-08-05 DIAGNOSIS — Z951 Presence of aortocoronary bypass graft: Secondary | ICD-10-CM

## 2023-08-05 NOTE — Patient Instructions (Addendum)
Patient Instructions  Patient Details  Name: Brad Chen MRN: [id number] Date of Birth: 1960/10/06 Referring Provider:  Lauro Regulus, MD  Below are your personal goals for exercise, nutrition, and risk factors. Our goal is to help you stay on track towards obtaining and maintaining these goals. We will be discussing your progress on these goals with you throughout the program.  Initial Exercise Prescription:  Initial Exercise Prescription - 08/05/23 1100       Date of Initial Exercise RX and Referring Provider   Date 08/05/23    Referring Provider Dr. Einar Crow, MD      Oxygen   Maintain Oxygen Saturation 88% or higher      Treadmill   MPH 2.5    Grade 1    Minutes 15    METs 3.26      NuStep   Level 3   T6 nustep   SPM 80    Minutes 15    METs 4.04      REL-XR   Level 3    Speed 50    Minutes 15    METs 4.04      Prescription Details   Frequency (times per week) 3    Duration Progress to 30 minutes of continuous aerobic without signs/symptoms of physical distress      Intensity   THRR 40-80% of Max Heartrate 100-138    Ratings of Perceived Exertion 11-13    Perceived Dyspnea 0-4      Progression   Progression Continue to progress workloads to maintain intensity without signs/symptoms of physical distress.      Resistance Training   Training Prescription Yes    Weight 2 lb    Reps 10-15             Exercise Goals: Frequency: Be able to perform aerobic exercise two to three times per week in program working toward 2-5 days per week of home exercise.  Intensity: Work with a perceived exertion of 11 (fairly light) - 15 (hard) while following your exercise prescription.  We will make changes to your prescription with you as you progress through the program.   Duration: Be able to do 30 to 45 minutes of continuous aerobic exercise in addition to a 5 minute warm-up and a 5 minute cool-down routine.   Nutrition Goals: Your personal  nutrition goals will be established when you do your nutrition analysis with the dietician.  The following are general nutrition guidelines to follow: Cholesterol < 200mg /day Sodium < 1500mg /day Fiber: Men over 50 yrs - 30 grams per day  Personal Goals:  Personal Goals and Risk Factors at Admission - 07/28/23 1450       Core Components/Risk Factors/Patient Goals on Admission    Weight Management Yes;Weight Maintenance    Intervention Weight Management: Develop a combined nutrition and exercise program designed to reach desired caloric intake, while maintaining appropriate intake of nutrient and fiber, sodium and fats, and appropriate energy expenditure required for the weight goal.;Weight Management: Provide education and appropriate resources to help participant work on and attain dietary goals.    Expected Outcomes Weight Maintenance: Understanding of the daily nutrition guidelines, which includes 25-35% calories from fat, 7% or less cal from saturated fats, less than 200mg  cholesterol, less than 1.5gm of sodium, & 5 or more servings of fruits and vegetables daily;Understanding of distribution of calorie intake throughout the day with the consumption of 4-5 meals/snacks;Understanding recommendations for meals to include 15-35% energy as protein, 25-35% energy  from fat, 35-60% energy from carbohydrates, less than 200mg  of dietary cholesterol, 20-35 gm of total fiber daily    Hypertension Yes    Intervention Provide education on lifestyle modifcations including regular physical activity/exercise, weight management, moderate sodium restriction and increased consumption of fresh fruit, vegetables, and low fat dairy, alcohol moderation, and smoking cessation.;Monitor prescription use compliance.    Expected Outcomes Short Term: Continued assessment and intervention until BP is < 140/29mm HG in hypertensive participants. < 130/51mm HG in hypertensive participants with diabetes, heart failure or chronic  kidney disease.;Long Term: Maintenance of blood pressure at goal levels.    Lipids Yes    Intervention Provide education and support for participant on nutrition & aerobic/resistive exercise along with prescribed medications to achieve LDL 70mg , HDL >40mg .    Expected Outcomes Short Term: Participant states understanding of desired cholesterol values and is compliant with medications prescribed. Participant is following exercise prescription and nutrition guidelines.;Long Term: Cholesterol controlled with medications as prescribed, with individualized exercise RX and with personalized nutrition plan. Value goals: LDL < 70mg , HDL > 40 mg.            Exercise Goals and Review:  Exercise Goals     Row Name 08/05/23 0912             Exercise Goals   Increase Physical Activity Yes       Intervention Provide advice, education, support and counseling about physical activity/exercise needs.;Develop an individualized exercise prescription for aerobic and resistive training based on initial evaluation findings, risk stratification, comorbidities and participant's personal goals.       Expected Outcomes Short Term: Attend rehab on a regular basis to increase amount of physical activity.;Long Term: Add in home exercise to make exercise part of routine and to increase amount of physical activity.;Long Term: Exercising regularly at least 3-5 days a week.       Increase Strength and Stamina Yes       Intervention Provide advice, education, support and counseling about physical activity/exercise needs.;Develop an individualized exercise prescription for aerobic and resistive training based on initial evaluation findings, risk stratification, comorbidities and participant's personal goals.       Expected Outcomes Short Term: Increase workloads from initial exercise prescription for resistance, speed, and METs.;Short Term: Perform resistance training exercises routinely during rehab and add in resistance  training at home;Long Term: Improve cardiorespiratory fitness, muscular endurance and strength as measured by increased METs and functional capacity ( )       Able to understand and use rate of perceived exertion (RPE) scale Yes       Intervention Provide education and explanation on how to use RPE scale       Expected Outcomes Short Term: Able to use RPE daily in rehab to express subjective intensity level;Long Term:  Able to use RPE to guide intensity level when exercising independently       Able to understand and use Dyspnea scale Yes       Intervention Provide education and explanation on how to use Dyspnea scale       Expected Outcomes Short Term: Able to use Dyspnea scale daily in rehab to express subjective sense of shortness of breath during exertion;Long Term: Able to use Dyspnea scale to guide intensity level when exercising independently       Knowledge and understanding of Target Heart Rate Range (THRR) Yes       Intervention Provide education and explanation of THRR including how the numbers were predicted and  where they are located for reference       Expected Outcomes Long Term: Able to use THRR to govern intensity when exercising independently;Short Term: Able to state/look up THRR;Short Term: Able to use daily as guideline for intensity in rehab       Able to check pulse independently Yes       Intervention Provide education and demonstration on how to check pulse in carotid and radial arteries.;Review the importance of being able to check your own pulse for safety during independent exercise       Expected Outcomes Short Term: Able to explain why pulse checking is important during independent exercise;Long Term: Able to check pulse independently and accurately       Understanding of Exercise Prescription Yes       Intervention Provide education, explanation, and written materials on patient's individual exercise prescription       Expected Outcomes Short Term: Able to explain  program exercise prescription;Long Term: Able to explain home exercise prescription to exercise independently

## 2023-08-05 NOTE — Progress Notes (Signed)
Cardiac Individual Treatment Plan  Patient Details  Name: Brad Chen MRN: [id number] Date of Birth: October 27, 1960 Referring Provider:   Flowsheet Row Cardiac Rehab from 08/05/2023 in [facility] Cardiac and Pulmonary Rehab  Referring Provider Dr. Einar Crow, MD       Initial Encounter Date:  Flowsheet Row Cardiac Rehab from 08/05/2023 in [facility] Cardiac and Pulmonary Rehab  Date 08/05/23       Visit Diagnosis: S/P CABG x 3  Patient's Home Medications on Admission:  Current Outpatient Medications:    Ascorbic Acid (VITAMIN C PO), Take 1 tablet by mouth See admin instructions. Takes when he remembers, Disp: , Rfl:    aspirin EC 81 MG tablet, Take 1 tablet (81 mg total) by mouth daily. Swallow whole., Disp: 150 tablet, Rfl: 2   atorvastatin (LIPITOR) 10 MG tablet, Take 1 tablet (10 mg total) by mouth daily., Disp: 30 tablet, Rfl: 11   clopidogrel (PLAVIX) 75 MG tablet, Take 1 tablet (75 mg total) by mouth daily., Disp: 30 tablet, Rfl: 11   metoprolol tartrate (LOPRESSOR) 50 MG tablet, Take 50 mg (1 tablet) TWO hours prior to CT scan, Disp: 1 tablet, Rfl: 0   Multiple Vitamins-Minerals (MULTIVITAMIN ADULTS PO), Take 1 tablet by mouth See admin instructions. When he remembers, Disp: , Rfl:    oxyCODONE-acetaminophen (PERCOCET) 5-325 MG tablet, Take 1-2 tablets by mouth every 6 (six) hours as needed for severe pain. 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   a.) s/p partial pharyngectomy, small FOM lesion resection, biodesign placement 11/25/2022 --> pathology (+) for 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); p16 and HPV (-)   Status post carpal tunnel release of both wrists    Subclavian artery stenosis, left (HCC) 01/2023   a.) s/p PTA and stenting 02/13/2023--> tandem proximal LEFT subclavian and LEFT axillary artery stents   TIA (transient ischemic attack) 2018   Torus  mandibularis    a.) s/p excision 11/25/2022    Tobacco Use: Social History   Tobacco Use  Smoking Status Former   Average packs/day: 1 pack/day for 33.0 years (33.0 ttl pk-yrs)   Types: Cigarettes   Start date: 1990  Smokeless Tobacco Never    Labs: Review Flowsheet        No data to display           Exercise Target Goals: Exercise Program Goal: Individual exercise prescription set using results from initial 6 min walk test and THRR while considering  patient's activity barriers and safety.   Exercise Prescription  Goal: Initial exercise prescription builds to 30-45 minutes a day of aerobic activity, 2-3 days per week.  Home exercise guidelines will be given to patient during program as part of exercise prescription that the participant will acknowledge.   Education: Aerobic Exercise: - Group verbal and visual presentation on the components of exercise prescription. Introduces F.I.T.T principle from ACSM for exercise prescriptions.  Reviews F.I.T.T. principles of aerobic exercise including progression. Written material given at graduation. Flowsheet Row Cardiac Rehab from 08/05/2023 in [facility] Cardiac and Pulmonary Rehab  Education need identified 08/05/23       Education: Resistance Exercise: - Group verbal and visual presentation on the components of exercise prescription. Introduces F.I.T.T principle from ACSM for exercise prescriptions  Reviews F.I.T.T. principles of resistance exercise including progression. Written material given at graduation.    Education: Exercise & Equipment Safety: - Individual verbal instruction and demonstration of equipment use and safety with use of the equipment. Flowsheet Row Cardiac Rehab from 08/05/2023 in [facility] Cardiac and Pulmonary Rehab  Date 08/05/23  Educator NT  Instruction Review Code 1- Verbalizes Understanding       Education: Exercise Physiology & General Exercise Guidelines: - Group verbal and written instruction with models to review the exercise physiology of the cardiovascular system and associated critical values. Provides general exercise guidelines with specific guidelines to those with heart or lung disease.    Education: Flexibility, Balance, Mind/Body Relaxation: - Group verbal and visual presentation with interactive activity on the components of exercise prescription. Introduces F.I.T.T principle from ACSM for exercise prescriptions. Reviews F.I.T.T. principles of flexibility and balance exercise training including progression. Also discusses  the mind body connection.  Reviews various relaxation techniques to help reduce and manage stress (i.e. Deep breathing, progressive muscle relaxation, and visualization). Balance handout provided to take home. Written material given at graduation.   Activity Barriers & Risk Stratification:  Activity Barriers & Cardiac Risk Stratification - 08/05/23 0912       Activity Barriers & Cardiac Risk Stratification   Activity Barriers Joint Problems;Other (comment)    Comments Hx Shoulder Surgery, Neuropathy in feet    Cardiac Risk Stratification High             6 Minute Walk:  6 Minute Walk     Row Name 08/05/23 0910         6 Minute Walk   Phase Initial     Distance 1330 feet     Walk Time 6 minutes     # of Rest Breaks 0     MPH 2.52     METS 4.04     RPE 6     Perceived Dyspnea  0     VO2 Peak 14.14     Symptoms No     Resting HR 62 bpm     Resting BP 152/74     Resting Oxygen  Saturation  100 %     Exercise Oxygen Saturation  during 6 min walk 99 %     Max Ex. HR 103 bpm     Max Ex. BP 164/76     2 Minute Post BP 158/76              Oxygen Initial Assessment:   Oxygen Re-Evaluation:   Oxygen Discharge (Final Oxygen Re-Evaluation):   Initial Exercise Prescription:  Initial Exercise Prescription - 08/05/23 1100       Date of Initial Exercise RX and Referring Provider   Date 08/05/23    Referring Provider Dr. Einar Crow, MD      Oxygen   Maintain Oxygen Saturation 88% or higher      Treadmill   MPH 2.5    Grade 1    Minutes 15    METs 3.26      NuStep   Level 3   T6 nustep   SPM 80    Minutes 15    METs 4.04      REL-XR   Level 3    Speed 50    Minutes 15    METs 4.04      Prescription Details   Frequency (times per week) 3    Duration Progress to 30 minutes of continuous aerobic without signs/symptoms of physical distress      Intensity   THRR 40-80% of Max Heartrate 100-138    Ratings of Perceived Exertion 11-13     Perceived Dyspnea 0-4      Progression   Progression Continue to progress workloads to maintain intensity without signs/symptoms of physical distress.      Resistance Training   Training Prescription Yes    Weight 2 lb    Reps 10-15             Perform Capillary Blood Glucose checks as needed.  Exercise Prescription Changes:   Exercise Prescription Changes     Row Name 08/05/23 1100             Response to Exercise   Blood Pressure (Admit) 152/74       Blood Pressure (Exercise) 164/76       Blood Pressure (Exit) 158/76       Heart Rate (Admit) 62 bpm       Heart Rate (Exercise) 103 bpm       Heart Rate (Exit) 67 bpm       Oxygen Saturation (Admit) 100 %       Oxygen Saturation (Exercise) 99 %       Rating of Perceived Exertion (Exercise) 6       Perceived Dyspnea (Exercise) 0       Symptoms none       Comments Results                Exercise Comments:   Exercise Goals and Review:   Exercise Goals     Row Name 08/05/23 0912             Exercise Goals   Increase Physical Activity Yes       Intervention Provide advice, education, support and counseling about physical activity/exercise needs.;Develop an individualized exercise prescription for aerobic and resistive training based on initial evaluation findings, risk stratification, comorbidities and participant's personal goals.       Expected Outcomes Short Term: Attend rehab on a regular basis to increase amount of physical activity.;Long Term: Add in home exercise to make exercise part  of routine and to increase amount of physical activity.;Long Term: Exercising regularly at least 3-5 days a week.       Increase Strength and Stamina Yes       Intervention Provide advice, education, support and counseling about physical activity/exercise needs.;Develop an individualized exercise prescription for aerobic and resistive training based on initial evaluation findings, risk stratification, comorbidities  and participant's personal goals.       Expected Outcomes Short Term: Increase workloads from initial exercise prescription for resistance, speed, and METs.;Short Term: Perform resistance training exercises routinely during rehab and add in resistance training at home;Long Term: Improve cardiorespiratory fitness, muscular endurance and strength as measured by increased METs and functional capacity ( )       Able to understand and use rate of perceived exertion (RPE) scale Yes       Intervention Provide education and explanation on how to use RPE scale       Expected Outcomes Short Term: Able to use RPE daily in rehab to express subjective intensity level;Long Term:  Able to use RPE to guide intensity level when exercising independently       Able to understand and use Dyspnea scale Yes       Intervention Provide education and explanation on how to use Dyspnea scale       Expected Outcomes Short Term: Able to use Dyspnea scale daily in rehab to express subjective sense of shortness of breath during exertion;Long Term: Able to use Dyspnea scale to guide intensity level when exercising independently       Knowledge and understanding of Target Heart Rate Range (THRR) Yes       Intervention Provide education and explanation of THRR including how the numbers were predicted and where they are located for reference       Expected Outcomes Long Term: Able to use THRR to govern intensity when exercising independently;Short Term: Able to state/look up THRR;Short Term: Able to use daily as guideline for intensity in rehab       Able to check pulse independently Yes       Intervention Provide education and demonstration on how to check pulse in carotid and radial arteries.;Review the importance of being able to check your own pulse for safety during independent exercise       Expected Outcomes Short Term: Able to explain why pulse checking is important during independent exercise;Long Term: Able to check pulse  independently and accurately       Understanding of Exercise Prescription Yes       Intervention Provide education, explanation, and written materials on patient's individual exercise prescription       Expected Outcomes Short Term: Able to explain program exercise prescription;Long Term: Able to explain home exercise prescription to exercise independently                Exercise Goals Re-Evaluation :   Discharge Exercise Prescription (Final Exercise Prescription Changes):  Exercise Prescription Changes - 08/05/23 1100       Response to Exercise   Blood Pressure (Admit) 152/74    Blood Pressure (Exercise) 164/76    Blood Pressure (Exit) 158/76    Heart Rate (Admit) 62 bpm    Heart Rate (Exercise) 103 bpm    Heart Rate (Exit) 67 bpm    Oxygen Saturation (Admit) 100 %    Oxygen Saturation (Exercise) 99 %    Rating of Perceived Exertion (Exercise) 6    Perceived Dyspnea (Exercise) 0    Symptoms none  Comments Results             Nutrition:  Target Goals: Understanding of nutrition guidelines, daily intake of sodium 1500mg , cholesterol 200mg , calories 30% from fat and 7% or less from saturated fats, daily to have 5 or more servings of fruits and vegetables.  Education: All About Nutrition: -Group instruction provided by verbal, written material, interactive activities, discussions, models, and posters to present general guidelines for heart healthy nutrition including fat, fiber, MyPlate, the role of sodium in heart healthy nutrition, utilization of the nutrition label, and utilization of this knowledge for meal planning. Follow up email sent as well. Written material given at graduation. Flowsheet Row Cardiac Rehab from 08/05/2023 in [facility] Cardiac and Pulmonary Rehab  Education need identified 08/05/23       Biometrics:  Pre Biometrics - 08/05/23 0913       Pre Biometrics   Height 5' 9.5" (1.765 m)    Weight 146 lb 11.2 oz (66.5 kg)    Waist Circumference  31 inches    Hip Circumference 35 inches    Waist to Hip Ratio 0.89 %    BMI (Calculated) 21.36    Single Leg Stand 8 seconds              Nutrition Therapy Plan and Nutrition Goals:  Nutrition Therapy & Goals - 08/05/23 1123       Nutrition Therapy   RD appointment deferred Yes      Intervention Plan   Intervention Prescribe, educate and counsel regarding individualized specific dietary modifications aiming towards targeted core components such as weight, hypertension, lipid management, diabetes, heart failure and other comorbidities.    Expected Outcomes Short Term Goal: Understand basic principles of dietary content, such as calories, fat, sodium, cholesterol and nutrients.;Short Term Goal: A plan has been developed with personal nutrition goals set during dietitian appointment.;Long Term Goal: Adherence to prescribed nutrition plan.             Nutrition Assessments:  MEDIFICTS Score Key: >=70 Need to make dietary changes  40-70 Heart Healthy Diet <= 40 Therapeutic Level Cholesterol Diet  Flowsheet Row Cardiac Rehab from 08/05/2023 in [facility] Cardiac and Pulmonary Rehab  Picture Your Plate Total Score on Admission 49      Picture Your Plate Scores: <81 Unhealthy dietary pattern with much room for improvement. 41-50 Dietary pattern unlikely to meet recommendations for good health and room for improvement. 51-60 More healthful dietary pattern, with some room for improvement.  >60 Healthy dietary pattern, although there may be some specific behaviors that could be improved.    Nutrition Goals Re-Evaluation:   Nutrition Goals Discharge (Final Nutrition Goals Re-Evaluation):   Psychosocial: Target Goals: Acknowledge presence or absence of significant depression and/or stress, maximize coping skills, provide positive support system. Participant is able to verbalize types and ability to use techniques and skills needed for reducing stress and depression.    Education: Stress, Anxiety, and Depression - Group verbal and visual presentation to define topics covered.  Reviews how body is impacted by stress, anxiety, and depression.  Also discusses healthy ways to reduce stress and to treat/manage anxiety and depression.  Written material given at graduation.   Education: Sleep Hygiene -Provides group verbal and written instruction about how sleep can affect your health.  Define sleep hygiene, discuss sleep cycles and impact of sleep habits. Review good sleep hygiene tips.    Initial Review & Psychosocial Screening:  Initial Psych Review & Screening - 07/28/23 1453  Initial Review   Current issues with None Identified      Family Dynamics   Good Support System? Yes   wife     Barriers   Psychosocial barriers to participate in program There are no identifiable barriers or psychosocial needs.      Screening Interventions   Interventions Encouraged to exercise;Provide feedback about the scores to participant;To provide support and resources with identified psychosocial needs    Expected Outcomes Short Term goal: Utilizing psychosocial counselor, staff and physician to assist with identification of specific Stressors or current issues interfering with healing process. Setting desired goal for each stressor or current issue identified.;Long Term Goal: Stressors or current issues are controlled or eliminated.;Short Term goal: Identification and review with participant of any Quality of Life or Depression concerns found by scoring the questionnaire.;Long Term goal: The participant improves quality of Life and PHQ9 Scores as seen by post scores and/or verbalization of changes             Quality of Life Scores:   Quality of Life - 08/05/23 1124       Quality of Life   Select Quality of Life      Quality of Life Scores   Health/Function Pre 28.33 %    Socioeconomic Pre 30 %    Psych/Spiritual Pre 28.29 %    Family Pre 30 %     GLOBAL Pre 28.91 %            Scores of 19 and below usually indicate a poorer quality of life in these areas.  A difference of  2-3 points is a clinically meaningful difference.  A difference of 2-3 points in the total score of the Quality of Life Index has been associated with significant improvement in overall quality of life, self-image, physical symptoms, and general health in studies assessing change in quality of life.  PHQ-9: Review Flowsheet       08/05/2023  Depression screen PHQ 2/9  Decreased Interest 0  Down, Depressed, Hopeless 0  PHQ - 2 Score 0  Altered sleeping 1  Tired, decreased energy 0  Change in appetite 0  Feeling bad or failure about yourself  0  Trouble concentrating 0  Moving slowly or fidgety/restless 0  Suicidal thoughts 0  PHQ-9 Score 1  Difficult doing work/chores Not difficult at all    Details           Interpretation of Total Score  Total Score Depression Severity:  1-4 = Minimal depression, 5-9 = Mild depression, 10-14 = Moderate depression, 15-19 = Moderately severe depression, 20-27 = Severe depression   Psychosocial Evaluation and Intervention:  Psychosocial Evaluation - 07/28/23 1503       Psychosocial Evaluation & Interventions   Interventions Encouraged to exercise with the program and follow exercise prescription    Comments Johnandrew is coming to cardiac rehab post CABG x3. He states he feels like this surgery has been easier to recover from compared to his previous surgeries. 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 He is retired from [employer]. He has a home gym and up until 6 months ago, was using it consistently. He wants to get back to a regular exercise routine. He states his wife and his 4 dogs are his main support system.    Expected Outcomes Short: attend cardiac rehab for education and exercise. Long: Develop and maintain positive self care habits  Continue  Psychosocial Services  Follow up required by staff             Psychosocial Re-Evaluation:   Psychosocial Discharge (Final Psychosocial Re-Evaluation):   Vocational Rehabilitation: Provide vocational rehab assistance to qualifying candidates.   Vocational Rehab Evaluation & Intervention:  Vocational Rehab - 07/28/23 1453       Initial Vocational Rehab Evaluation & Intervention   Assessment shows need for Vocational Rehabilitation No             Education: Education Goals: Education classes will be provided on a variety of topics geared toward better understanding of heart health and risk factor modification. Participant will state understanding/return demonstration of topics presented as noted by education test scores.  Learning Barriers/Preferences:  Learning Barriers/Preferences - 07/28/23 1453       Learning Barriers/Preferences   Learning Barriers None    Learning Preferences None             General Cardiac Education Topics:  AED/CPR: - Group verbal and written instruction with the use of models to demonstrate the basic use of the AED with the basic ABC's of resuscitation.   Anatomy and Cardiac Procedures: - Group verbal and visual presentation and models provide information about basic cardiac anatomy and function. Reviews the testing methods done to diagnose heart disease and the outcomes of the test results. Describes the treatment choices: Medical Management, Angioplasty, or Coronary Bypass Surgery for treating various heart conditions including Myocardial Infarction, Angina, Valve Disease, and Cardiac Arrhythmias.  Written material given at graduation.   Medication Safety: - Group verbal and visual instruction to review commonly prescribed medications for heart and lung disease. Reviews the medication, class of the drug, and side effects. Includes the steps to properly store meds and maintain the prescription regimen.  Written material given at  graduation.   Intimacy: - Group verbal instruction through game format to discuss how heart and lung disease can affect sexual intimacy. Written material given at graduation..   Know Your Numbers and Heart Failure: - Group verbal and visual instruction to discuss disease risk factors for cardiac and pulmonary disease and treatment options.  Reviews associated critical values for Overweight/Obesity, Hypertension, Cholesterol, and Diabetes.  Discusses basics of heart failure: signs/symptoms and treatments.  Introduces Heart Failure Zone chart for action plan for heart failure.  Written material given at graduation. Flowsheet Row Cardiac Rehab from 08/05/2023 in [facility] Cardiac and Pulmonary Rehab  Education need identified 08/05/23       Infection Prevention: - Provides verbal and written material to individual with discussion of infection control including proper hand washing and proper equipment cleaning during exercise session. Flowsheet Row Cardiac Rehab from 08/05/2023 in [facility] Cardiac and Pulmonary Rehab  Date 08/05/23  Educator NT  Instruction Review Code 1- Verbalizes Understanding       Falls Prevention: - Provides verbal and written material to individual with discussion of falls prevention and safety. Flowsheet Row Cardiac Rehab from 08/05/2023 in [facility] Cardiac and Pulmonary Rehab  Date 08/05/23  Educator NT  Instruction Review Code 1- Verbalizes Understanding       Other: -Provides group and verbal instruction on various topics (see comments)   Knowledge Questionnaire Score:  Knowledge Questionnaire Score - 08/05/23 1123       Knowledge Questionnaire Score   Pre Score 22/26             Core Components/Risk Factors/Patient Goals at Admission:  Personal Goals and Risk Factors at Admission - 07/28/23 1450  Core Components/Risk Factors/Patient Goals on Admission    Weight Management Yes;Weight Maintenance    Intervention Weight Management: Develop a  combined nutrition and exercise program designed to reach desired caloric intake, while maintaining appropriate intake of nutrient and fiber, sodium and fats, and appropriate energy expenditure required for the weight goal.;Weight Management: Provide education and appropriate resources to help participant work on and attain dietary goals.    Expected Outcomes Weight Maintenance: Understanding of the daily nutrition guidelines, which includes 25-35% calories from fat, 7% or less cal from saturated fats, less than 200mg  cholesterol, less than 1.5gm of sodium, & 5 or more servings of fruits and vegetables daily;Understanding of distribution of calorie intake throughout the day with the consumption of 4-5 meals/snacks;Understanding recommendations for meals to include 15-35% energy as protein, 25-35% energy from fat, 35-60% energy from carbohydrates, less than 200mg  of dietary cholesterol, 20-35 gm of total fiber daily    Hypertension Yes    Intervention Provide education on lifestyle modifcations including regular physical activity/exercise, weight management, moderate sodium restriction and increased consumption of fresh fruit, vegetables, and low fat dairy, alcohol moderation, and smoking cessation.;Monitor prescription use compliance.    Expected Outcomes Short Term: Continued assessment and intervention until BP is < 140/48mm HG in hypertensive participants. < 130/63mm HG in hypertensive participants with diabetes, heart failure or chronic kidney disease.;Long Term: Maintenance of blood pressure at goal levels.    Lipids Yes    Intervention Provide education and support for participant on nutrition & aerobic/resistive exercise along with prescribed medications to achieve LDL 70mg , HDL >40mg .    Expected Outcomes Short Term: Participant states understanding of desired cholesterol values and is compliant with medications prescribed. Participant is following exercise prescription and nutrition guidelines.;Long  Term: Cholesterol controlled with medications as prescribed, with individualized exercise RX and with personalized nutrition plan. Value goals: LDL < 70mg , HDL > 40 mg.             Education:Diabetes - Individual verbal and written instruction to review signs/symptoms of diabetes, desired ranges of glucose level fasting, after meals and with exercise. Acknowledge that pre and post exercise glucose checks will be done for 3 sessions at entry of program.   Core Components/Risk Factors/Patient Goals Review:    Core Components/Risk Factors/Patient Goals at Discharge (Final Review):    ITP Comments:  ITP Comments     Row Name 07/28/23 1501 08/05/23 0909         ITP Comments Initial phone call completed. Diagnosis can be found in [facility] 10/20. EP Orientation scheduled for Thursday 11/14 at 9am. Completed and gym orientation. Initial ITP created and sent for review to Dr. Bethann Punches, Medical Director.               Comments: Initial ITP

## 2023-08-10 ENCOUNTER — Encounter: Payer: PRIVATE HEALTH INSURANCE | Admitting: *Deleted

## 2023-08-10 DIAGNOSIS — Z951 Presence of aortocoronary bypass graft: Secondary | ICD-10-CM | POA: Diagnosis not present

## 2023-08-10 NOTE — Progress Notes (Signed)
Daily Session Note  Patient Details  Name: Brad Chen MRN: [id number] Date of Birth: [birth year] Referring Provider:   Flowsheet Row Cardiac Rehab from 08/05/2023 in [facility] Cardiac and Pulmonary Rehab  Referring Provider Dr. Einar Crow, MD       Encounter Date: 08/10/2023  Check In:  Session Check In - 08/10/23 0927       Check-In   Supervising physician immediately available to respond to emergencies See telemetry face sheet for immediately available ER MD    Location [facility]-Cardiac & Pulmonary Rehab    Staff Present Rory Percy, MS, Exercise Physiologist;Susanne Bice, RN, BSN, CCRP;Maxon Conetta BS, , Exercise Physiologist;Page Lancon Katrinka Blazing, RN, ADN    Virtual Visit No    Medication changes reported     No    Fall or balance concerns reported    No    Warm-up and Cool-down Performed on first and last piece of equipment    Resistance Training Performed Yes    VAD Patient? No    PAD/SET Patient? No      Pain Assessment   Currently in Pain? No/denies                Social History   Tobacco Use  Smoking Status Former   Average packs/day: 1 pack/day for 33.0 years (33.0 ttl pk-yrs)   Types: Cigarettes   Start date: 1990  Smokeless Tobacco Never    Goals Met:  Independence with exercise equipment Exercise tolerated well No report of concerns or symptoms today Strength training completed today  Goals Unmet:  Not Applicable  Comments: First full day of exercise!  Patient was oriented to gym and equipment including functions, settings, policies, and procedures.  Patient's individual exercise prescription and treatment plan were reviewed.  All starting workloads were established based on the results of the 6 minute walk test done at initial orientation visit.  The plan for exercise progression was also introduced and progression will be customized based on patient's performance and goals.    Dr. Bethann Punches is Medical Director for [facility] Cardiac  Rehabilitation.  Dr. Vida Rigger is Medical Director for [facility] Pulmonary Rehabilitation.

## 2023-08-12 ENCOUNTER — Encounter: Payer: PRIVATE HEALTH INSURANCE | Admitting: *Deleted

## 2023-08-12 DIAGNOSIS — Z951 Presence of aortocoronary bypass graft: Secondary | ICD-10-CM

## 2023-08-12 NOTE — Progress Notes (Signed)
Daily Session Note  Patient Details  Name: Brad Chen MRN: [id number] Date of Birth: February 14, 1961 Referring Provider:   Flowsheet Row Cardiac Rehab from 08/05/2023 in [facility] Cardiac and Pulmonary Rehab  Referring Provider Dr. Einar Crow, MD       Encounter Date: 08/12/2023  Check In:  Session Check In - 08/12/23 0930       Check-In   Supervising physician immediately available to respond to emergencies See telemetry face sheet for immediately available ER MD    Location [facility]-Cardiac & Pulmonary Rehab    Staff Present Cora Collum, RN, BSN, CCRP;Margaret Best, MS, Exercise Physiologist;Maxon Conetta BS, , Exercise Physiologist;Charo Philipp Katrinka Blazing, RN, ADN    Virtual Visit No    Medication changes reported     No    Fall or balance concerns reported    No    Warm-up and Cool-down Performed on first and last piece of equipment    Resistance Training Performed Yes    VAD Patient? No    PAD/SET Patient? No      Pain Assessment   Currently in Pain? No/denies                Social History   Tobacco Use  Smoking Status Former   Average packs/day: 1 pack/day for 33.0 years (33.0 ttl pk-yrs)   Types: Cigarettes   Start date: 1990  Smokeless Tobacco Never    Goals Met:  Independence with exercise equipment Exercise tolerated well No report of concerns or symptoms today Strength training completed today  Goals Unmet:  Not Applicable  Comments: Pt able to follow exercise prescription today without complaint.  Will continue to monitor for progression.    Dr. Bethann Punches is Medical Director for [facility] Cardiac Rehabilitation.  Dr. Vida Rigger is Medical Director for [facility] Pulmonary Rehabilitation.

## 2023-08-17 ENCOUNTER — Encounter: Payer: PRIVATE HEALTH INSURANCE | Attending: Internal Medicine | Admitting: *Deleted

## 2023-08-17 DIAGNOSIS — Z48812 Encounter for surgical aftercare following surgery on the circulatory system: Secondary | ICD-10-CM | POA: Diagnosis not present

## 2023-08-17 DIAGNOSIS — Z951 Presence of aortocoronary bypass graft: Secondary | ICD-10-CM | POA: Diagnosis present

## 2023-08-17 NOTE — Progress Notes (Signed)
Daily Session Note  Patient Details  Name: Brad Chen MRN: [id number] Date of Birth: [birth year] Referring Provider:   Flowsheet Row Cardiac Rehab from 08/05/2023 in [facility] Cardiac and Pulmonary Rehab  Referring Provider Dr. Einar Crow, MD       Encounter Date: 08/17/2023  Check In:  Session Check In - 08/17/23 1015       Check-In   Supervising physician immediately available to respond to emergencies See telemetry face sheet for immediately available ER MD    Location [facility]-Cardiac & Pulmonary Rehab    Staff Present Rory Percy, MS, Exercise Physiologist;Laureen Manson Passey, BS, RRT, CPFT;Kelly Madilyn Fireman, BS, ACSM CEP, Exercise Physiologist;Winter Trefz Katrinka Blazing, RN, ADN    Virtual Visit No    Medication changes reported     No    Fall or balance concerns reported    No    Warm-up and Cool-down Performed on first and last piece of equipment    Resistance Training Performed Yes    VAD Patient? No    PAD/SET Patient? No      Pain Assessment   Currently in Pain? No/denies                Social History   Tobacco Use  Smoking Status Former   Average packs/day: 1 pack/day for 33.0 years (33.0 ttl pk-yrs)   Types: Cigarettes   Start date: 1990  Smokeless Tobacco Never    Goals Met:  Independence with exercise equipment Exercise tolerated well No report of concerns or symptoms today Strength training completed today  Goals Unmet:  Not Applicable  Comments: Pt able to follow exercise prescription today without complaint.  Will continue to monitor for progression.    Dr. Bethann Punches is Medical Director for [facility] Cardiac Rehabilitation.  Dr. Vida Rigger is Medical Director for [facility] Pulmonary Rehabilitation.

## 2023-08-19 ENCOUNTER — Encounter: Payer: PRIVATE HEALTH INSURANCE | Admitting: *Deleted

## 2023-08-24 ENCOUNTER — Encounter: Payer: PRIVATE HEALTH INSURANCE | Admitting: *Deleted

## 2023-08-24 DIAGNOSIS — Z951 Presence of aortocoronary bypass graft: Secondary | ICD-10-CM

## 2023-08-24 NOTE — Progress Notes (Signed)
Daily Session Note  Patient Details  Name: Brad Chen MRN: [id number] Date of Birth: [birth year] Referring Provider:   Flowsheet Row Cardiac Rehab from 08/05/2023 in [facility] Cardiac and Pulmonary Rehab  Referring Provider Dr. Einar Crow, MD       Encounter Date: 08/24/2023  Check In:  Session Check In - 08/24/23 0933       Check-In   Supervising physician immediately available to respond to emergencies See telemetry face sheet for immediately available ER MD    Location [facility]-Cardiac & Pulmonary Rehab    Staff Present Cora Collum, RN, BSN, CCRP;Margaret Best, MS, Exercise Physiologist;Maxon Conetta BS, , Exercise Physiologist;Adit Riddles Katrinka Blazing, RN, ADN    Virtual Visit No    Medication changes reported     No    Fall or balance concerns reported    No    Warm-up and Cool-down Performed on first and last piece of equipment    Resistance Training Performed Yes    VAD Patient? No    PAD/SET Patient? No      Pain Assessment   Currently in Pain? No/denies                Social History   Tobacco Use  Smoking Status Former   Average packs/day: 1 pack/day for 33.0 years (33.0 ttl pk-yrs)   Types: Cigarettes   Start date: 1990  Smokeless Tobacco Never    Goals Met:  Independence with exercise equipment Exercise tolerated well No report of concerns or symptoms today Strength training completed today  Goals Unmet:  Not Applicable  Comments: Pt able to follow exercise prescription today without complaint.  Will continue to monitor for progression.    Dr. Bethann Punches is Medical Director for [facility] Cardiac Rehabilitation.  Dr. Vida Rigger is Medical Director for [facility] Pulmonary Rehabilitation.

## 2023-08-26 ENCOUNTER — Encounter: Payer: PRIVATE HEALTH INSURANCE | Admitting: *Deleted

## 2023-08-26 DIAGNOSIS — Z951 Presence of aortocoronary bypass graft: Secondary | ICD-10-CM

## 2023-08-26 NOTE — Progress Notes (Signed)
Daily Session Note  Patient Details  Name: Brad Chen MRN: [id number] Date of Birth: 14-Mar-1961 Referring Provider:   Flowsheet Row Cardiac Rehab from 08/05/2023 in [facility] Cardiac and Pulmonary Rehab  Referring Provider Dr. Einar Crow, MD       Encounter Date: 08/26/2023  Check In:  Session Check In - 08/26/23 0933       Check-In   Supervising physician immediately available to respond to emergencies See telemetry face sheet for immediately available ER MD    Location [facility]-Cardiac & Pulmonary Rehab    Staff Present Cora Collum, RN, BSN, CCRP;Noah Tickle, BS, Exercise Physiologist;Maxon Conetta BS, , Exercise Physiologist;Muaad Boehning Katrinka Blazing, RN, ADN    Virtual Visit No    Medication changes reported     No    Fall or balance concerns reported    No    Warm-up and Cool-down Performed on first and last piece of equipment    Resistance Training Performed Yes    VAD Patient? No      Pain Assessment   Currently in Pain? No/denies                Social History   Tobacco Use  Smoking Status Former   Average packs/day: 1 pack/day for 33.0 years (33.0 ttl pk-yrs)   Types: Cigarettes   Start date: 1990  Smokeless Tobacco Never    Goals Met:  Independence with exercise equipment Exercise tolerated well No report of concerns or symptoms today Strength training completed today  Goals Unmet:  Not Applicable  Comments: Pt able to follow exercise prescription today without complaint.  Will continue to monitor for progression.    Dr. Bethann Punches is Medical Director for [facility] Cardiac Rehabilitation.  Dr. Vida Rigger is Medical Director for [facility] Pulmonary Rehabilitation.

## 2023-08-28 ENCOUNTER — Encounter: Payer: PRIVATE HEALTH INSURANCE | Admitting: *Deleted

## 2023-08-28 DIAGNOSIS — Z951 Presence of aortocoronary bypass graft: Secondary | ICD-10-CM | POA: Diagnosis not present

## 2023-08-28 NOTE — Progress Notes (Signed)
Daily Session Note  Patient Details  Name: Brad Chen MRN: [id number] Date of Birth: 1960-11-08 Referring Provider:   Flowsheet Row Cardiac Rehab from 08/05/2023 in [facility] Cardiac and Pulmonary Rehab  Referring Provider Dr. Einar Crow, MD       Encounter Date: 08/28/2023  Check In:  Session Check In - 08/28/23 1113       Check-In   Supervising physician immediately available to respond to emergencies See telemetry face sheet for immediately available ER MD    Location [facility]-Cardiac & Pulmonary Rehab    Staff Present Ronette Deter, BS, Exercise Physiologist;Joseph Shelbie Proctor, RN, [place]    Virtual Visit No    Medication changes reported     No    Fall or balance concerns reported    No    Warm-up and Cool-down Performed on first and last piece of equipment    Resistance Training Performed Yes    VAD Patient? No    PAD/SET Patient? No      Pain Assessment   Currently in Pain? No/denies                Social History   Tobacco Use  Smoking Status Former   Average packs/day: 1 pack/day for 33.0 years (33.0 ttl pk-yrs)   Types: Cigarettes   Start date: 1990  Smokeless Tobacco Never    Goals Met:  Independence with exercise equipment Exercise tolerated well No report of concerns or symptoms today Strength training completed today  Goals Unmet:  Not Applicable  Comments: Pt able to follow exercise prescription today without complaint.  Will continue to monitor for progression.    Dr. Bethann Punches is Medical Director for [facility] Cardiac Rehabilitation.  Dr. Vida Rigger is Medical Director for [facility] Pulmonary Rehabilitation.

## 2023-08-31 ENCOUNTER — Other Ambulatory Visit (INDEPENDENT_AMBULATORY_CARE_PROVIDER_SITE_OTHER): Payer: Self-pay | Admitting: Nurse Practitioner

## 2023-08-31 ENCOUNTER — Encounter: Payer: PRIVATE HEALTH INSURANCE | Admitting: *Deleted

## 2023-08-31 DIAGNOSIS — I6523 Occlusion and stenosis of bilateral carotid arteries: Secondary | ICD-10-CM

## 2023-08-31 DIAGNOSIS — I771 Stricture of artery: Secondary | ICD-10-CM

## 2023-08-31 DIAGNOSIS — Z951 Presence of aortocoronary bypass graft: Secondary | ICD-10-CM

## 2023-08-31 NOTE — Progress Notes (Signed)
Daily Session Note  Patient Details  Name: Brad Chen MRN: [id number] Date of Birth: May 17, 1961 Referring Provider:   Flowsheet Row Cardiac Rehab from 08/05/2023 in [facility] Cardiac and Pulmonary Rehab  Referring Provider Dr. Einar Crow, MD       Encounter Date: 08/31/2023  Check In:  Session Check In - 08/31/23 0958       Check-In   Supervising physician immediately available to respond to emergencies See telemetry face sheet for immediately available ER MD    Location [facility]-Cardiac & Pulmonary Rehab    Staff Present Rory Percy, MS, Exercise Physiologist;Maxon Suzzette Righter, , Exercise Physiologist;Kelly Madilyn Fireman, BS, ACSM CEP, Exercise Physiologist;Lyndsie Wallman Katrinka Blazing, RN, ADN    Virtual Visit No    Medication changes reported     No    Fall or balance concerns reported    No    Warm-up and Cool-down Performed on first and last piece of equipment    Resistance Training Performed Yes    VAD Patient? No    PAD/SET Patient? No      Pain Assessment   Currently in Pain? No/denies                Social History   Tobacco Use  Smoking Status Former   Average packs/day: 1 pack/day for 33.0 years (33.0 ttl pk-yrs)   Types: Cigarettes   Start date: 1990  Smokeless Tobacco Never    Goals Met:  Independence with exercise equipment Exercise tolerated well No report of concerns or symptoms today Strength training completed today  Goals Unmet:  Not Applicable  Comments: Pt able to follow exercise prescription today without complaint.  Will continue to monitor for progression.'    Dr. Bethann Punches is Medical Director for [facility] Cardiac Rehabilitation.  Dr. Vida Rigger is Medical Director for [facility] Pulmonary Rehabilitation.

## 2023-09-01 ENCOUNTER — Ambulatory Visit (INDEPENDENT_AMBULATORY_CARE_PROVIDER_SITE_OTHER): Payer: PRIVATE HEALTH INSURANCE | Admitting: Vascular Surgery

## 2023-09-01 ENCOUNTER — Encounter (INDEPENDENT_AMBULATORY_CARE_PROVIDER_SITE_OTHER): Payer: PRIVATE HEALTH INSURANCE

## 2023-09-02 ENCOUNTER — Encounter: Payer: Self-pay | Admitting: *Deleted

## 2023-09-02 ENCOUNTER — Encounter: Payer: PRIVATE HEALTH INSURANCE | Admitting: *Deleted

## 2023-09-02 DIAGNOSIS — Z951 Presence of aortocoronary bypass graft: Secondary | ICD-10-CM | POA: Diagnosis not present

## 2023-09-02 NOTE — Progress Notes (Signed)
Cardiac Individual Treatment Plan  Patient Details  Name: Brad Chen MRN: [id number] Date of Birth: 1961/02/23 Referring Provider:   Flowsheet Row Cardiac Rehab from 08/05/2023 in [facility] Cardiac and Pulmonary Rehab  Referring Provider Dr. Einar Crow, MD       Initial Encounter Date:  Flowsheet Row Cardiac Rehab from 08/05/2023 in [facility] Cardiac and Pulmonary Rehab  Date 08/05/23       Visit Diagnosis: S/P CABG x 3  Patient's Home Medications on Admission:  Current Outpatient Medications:    Ascorbic Acid (VITAMIN C PO), Take 1 tablet by mouth See admin instructions. Takes when he remembers, Disp: , Rfl:    aspirin EC 81 MG tablet, Take 1 tablet (81 mg total) by mouth daily. Swallow whole., Disp: 150 tablet, Rfl: 2   atorvastatin (LIPITOR) 10 MG tablet, Take 1 tablet (10 mg total) by mouth daily., Disp: 30 tablet, Rfl: 11   clopidogrel (PLAVIX) 75 MG tablet, Take 1 tablet (75 mg total) by mouth daily., Disp: 30 tablet, Rfl: 11   metoprolol tartrate (LOPRESSOR) 50 MG tablet, Take 50 mg (1 tablet) TWO hours prior to CT scan, Disp: 1 tablet, Rfl: 0   Multiple Vitamins-Minerals (MULTIVITAMIN ADULTS PO), Take 1 tablet by mouth See admin instructions. When he remembers, Disp: , Rfl:    oxyCODONE-acetaminophen (PERCOCET) 5-325 MG tablet, Take 1-2 tablets by mouth every 6 (six) hours as needed for severe pain. 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   a.) s/p partial pharyngectomy, small FOM lesion resection, biodesign placement 11/25/2022 --> pathology (+) for 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); p16 and HPV (-)   Status post carpal tunnel release of both wrists    Subclavian artery stenosis, left (HCC) 01/2023   a.) s/p PTA and stenting 02/13/2023--> tandem proximal LEFT subclavian and LEFT axillary artery stents   TIA (transient ischemic attack) 2018   Torus  mandibularis    a.) s/p excision 11/25/2022    Tobacco Use: Social History   Tobacco Use  Smoking Status Former   Average packs/day: 1 pack/day for 33.0 years (33.0 ttl pk-yrs)   Types: Cigarettes   Start date: 1990  Smokeless Tobacco Never    Labs: Review Flowsheet        No data to display           Exercise Target Goals: Exercise Program Goal: Individual exercise prescription set using results from initial 6 min walk test and THRR while considering  patient's activity barriers and safety.   Exercise Prescription  Goal: Initial exercise prescription builds to 30-45 minutes a day of aerobic activity, 2-3 days per week.  Home exercise guidelines will be given to patient during program as part of exercise prescription that the participant will acknowledge.   Education: Aerobic Exercise: - Group verbal and visual presentation on the components of exercise prescription. Introduces F.I.T.T principle from ACSM for exercise prescriptions.  Reviews F.I.T.T. principles of aerobic exercise including progression. Written material given at graduation. Flowsheet Row Cardiac Rehab from 08/05/2023 in [facility] Cardiac and Pulmonary Rehab  Education need identified 08/05/23       Education: Resistance Exercise: - Group verbal and visual presentation on the components of exercise prescription. Introduces F.I.T.T principle from ACSM for exercise prescriptions  Reviews F.I.T.T. principles of resistance exercise including progression. Written material given at graduation.    Education: Exercise & Equipment Safety: - Individual verbal instruction and demonstration of equipment use and safety with use of the equipment. Flowsheet Row Cardiac Rehab from 08/05/2023 in [facility] Cardiac and Pulmonary Rehab  Date 08/05/23  Educator NT  Instruction Review Code 1- Verbalizes Understanding       Education: Exercise Physiology & General Exercise Guidelines: - Group verbal and written instruction with models to review the exercise physiology of the cardiovascular system and associated critical values. Provides general exercise guidelines with specific guidelines to those with heart or lung disease.    Education: Flexibility, Balance, Mind/Body Relaxation: - Group verbal and visual presentation with interactive activity on the components of exercise prescription. Introduces F.I.T.T principle from ACSM for exercise prescriptions. Reviews F.I.T.T. principles of flexibility and balance exercise training including progression. Also discusses  the mind body connection.  Reviews various relaxation techniques to help reduce and manage stress (i.e. Deep breathing, progressive muscle relaxation, and visualization). Balance handout provided to take home. Written material given at graduation.   Activity Barriers & Risk Stratification:  Activity Barriers & Cardiac Risk Stratification - 08/05/23 0912       Activity Barriers & Cardiac Risk Stratification   Activity Barriers Joint Problems;Other (comment)    Comments Hx Shoulder Surgery, Neuropathy in feet    Cardiac Risk Stratification High             6 Minute Walk:  6 Minute Walk     Row Name 08/05/23 0910         6 Minute Walk   Phase Initial     Distance 1330 feet     Walk Time 6 minutes     # of Rest Breaks 0     MPH 2.52     METS 4.04     RPE 6     Perceived Dyspnea  0     VO2 Peak 14.14     Symptoms No     Resting HR 62 bpm     Resting BP 152/74     Resting Oxygen  Saturation  100 %     Exercise Oxygen Saturation  during 6 min walk 99 %     Max Ex. HR 103 bpm     Max Ex. BP 164/76     2 Minute Post BP 158/76              Oxygen Initial Assessment:   Oxygen Re-Evaluation:  Oxygen Re-Evaluation     Row Name 08/10/23 0930             Goals/Expected Outcomes   Comments Reviewed PLB technique with pt.  Talked about how it works and it's importance in maintaining their exercise saturations.       Goals/Expected Outcomes Short: Become more profiecient at using PLB. Long: Become independent at using PLB.                Oxygen Discharge (Final Oxygen Re-Evaluation):  Oxygen Re-Evaluation - 08/10/23 0930       Goals/Expected Outcomes   Comments Reviewed PLB technique with pt.  Talked about how it works and it's importance in maintaining their exercise saturations.    Goals/Expected Outcomes Short: Become more profiecient at using PLB. Long: Become independent at using PLB.             Initial Exercise Prescription:  Initial Exercise  Prescription - 08/05/23 1100       Date of Initial Exercise RX and Referring Provider   Date 08/05/23    Referring Provider Dr. Einar Crow, MD      Oxygen   Maintain Oxygen Saturation 88% or higher      Treadmill   MPH 2.5    Grade 1    Minutes 15    METs 3.26      NuStep   Level 3   T6 nustep   SPM 80    Minutes 15    METs 4.04      REL-XR   Level 3    Speed 50    Minutes 15    METs 4.04      Prescription Details   Frequency (times per week) 3    Duration Progress to 30 minutes of continuous aerobic without signs/symptoms of physical distress      Intensity   THRR 40-80% of Max Heartrate 100-138    Ratings of Perceived Exertion 11-13    Perceived Dyspnea 0-4      Progression   Progression Continue to progress workloads to maintain intensity without signs/symptoms of physical distress.      Resistance Training   Training Prescription Yes    Weight 2 lb    Reps 10-15             Perform Capillary Blood Glucose checks as needed.  Exercise Prescription Changes:   Exercise Prescription Changes     Row Name 08/05/23 1100 08/25/23 1500           Response to Exercise   Blood Pressure (Admit) 152/74 140/72      Blood Pressure (Exercise) 164/76 152/68      Blood Pressure (Exit) 158/76 110/62      Heart Rate (Admit) 62 bpm 119 bpm      Heart Rate (Exercise) 103 bpm 137 bpm      Heart Rate (Exit) 67 bpm 125 bpm      Oxygen Saturation (Admit) 100 % --      Oxygen Saturation (Exercise) 99 % --      Rating of Perceived Exertion (Exercise) 6 13  Perceived Dyspnea (Exercise) 0 0      Symptoms none none      Comments Results --      Duration -- Progress to 30 minutes of  aerobic without signs/symptoms of physical distress      Intensity -- THRR unchanged        Progression   Progression -- Continue to progress workloads to maintain intensity without signs/symptoms of physical distress.      Average METs -- 3.07        Resistance  Training   Training Prescription -- Yes      Weight -- 2 lb      Reps -- 10-15        Interval Training   Interval Training -- No        Treadmill   MPH -- 2.3      Grade -- 0.5      Minutes -- 15      METs -- 2.92        NuStep   Level -- 3  T6      Minutes -- 15      METs -- 2.3        REL-XR   Level -- 2      Minutes -- 15      METs -- 3.4        Oxygen   Maintain Oxygen Saturation -- 88% or higher               Exercise Comments:   Exercise Comments     Row Name 08/10/23 4010           Exercise Comments First full day of exercise!  Patient was oriented to gym and equipment including functions, settings, policies, and procedures.  Patient's individual exercise prescription and treatment plan were reviewed.  All starting workloads were established based on the results of the 6 minute walk test done at initial orientation visit.  The plan for exercise progression was also introduced and progression will be customized based on patient's performance and goals.                Exercise Goals and Review:   Exercise Goals     Row Name 08/05/23 0912             Exercise Goals   Increase Physical Activity Yes       Intervention Provide advice, education, support and counseling about physical activity/exercise needs.;Develop an individualized exercise prescription for aerobic and resistive training based on initial evaluation findings, risk stratification, comorbidities and participant's personal goals.       Expected Outcomes Short Term: Attend rehab on a regular basis to increase amount of physical activity.;Long Term: Add in home exercise to make exercise part of routine and to increase amount of physical activity.;Long Term: Exercising regularly at least 3-5 days a week.       Increase Strength and Stamina Yes       Intervention Provide advice, education, support and counseling about physical activity/exercise needs.;Develop an individualized exercise  prescription for aerobic and resistive training based on initial evaluation findings, risk stratification, comorbidities and participant's personal goals.       Expected Outcomes Short Term: Increase workloads from initial exercise prescription for resistance, speed, and METs.;Short Term: Perform resistance training exercises routinely during rehab and add in resistance training at home;Long Term: Improve cardiorespiratory fitness, muscular endurance and strength as measured by increased METs and functional capacity ( )  Able to understand and use rate of perceived exertion (RPE) scale Yes       Intervention Provide education and explanation on how to use RPE scale       Expected Outcomes Short Term: Able to use RPE daily in rehab to express subjective intensity level;Long Term:  Able to use RPE to guide intensity level when exercising independently       Able to understand and use Dyspnea scale Yes       Intervention Provide education and explanation on how to use Dyspnea scale       Expected Outcomes Short Term: Able to use Dyspnea scale daily in rehab to express subjective sense of shortness of breath during exertion;Long Term: Able to use Dyspnea scale to guide intensity level when exercising independently       Knowledge and understanding of Target Heart Rate Range (THRR) Yes       Intervention Provide education and explanation of THRR including how the numbers were predicted and where they are located for reference       Expected Outcomes Long Term: Able to use THRR to govern intensity when exercising independently;Short Term: Able to state/look up THRR;Short Term: Able to use daily as guideline for intensity in rehab       Able to check pulse independently Yes       Intervention Provide education and demonstration on how to check pulse in carotid and radial arteries.;Review the importance of being able to check your own pulse for safety during independent exercise       Expected Outcomes  Short Term: Able to explain why pulse checking is important during independent exercise;Long Term: Able to check pulse independently and accurately       Understanding of Exercise Prescription Yes       Intervention Provide education, explanation, and written materials on patient's individual exercise prescription       Expected Outcomes Short Term: Able to explain program exercise prescription;Long Term: Able to explain home exercise prescription to exercise independently                Exercise Goals Re-Evaluation :  Exercise Goals Re-Evaluation     Row Name 08/10/23 0454 08/25/23 1540 08/27/23 1209         Exercise Goal Re-Evaluation   Exercise Goals Review Able to understand and use rate of perceived exertion (RPE) scale;Able to understand and use Dyspnea scale;Knowledge and understanding of Target Heart Rate Range (THRR);Understanding of Exercise Prescription Increase Physical Activity;Increase Strength and Stamina;Understanding of Exercise Prescription Increase Physical Activity     Comments Reviewed RPE and dyspnea scale, THR and program prescription with pt today.  Pt voiced understanding and was given a copy of goals to take home. Brad Chen is off to a good start in the program. He continues to get familiar with his exercise prescription and the modalities involved. He was able to use his prescribed machines at their prescribed intensity and we will continue to monitor his progression in the program. Brad Chen wants to get back to using his TM and Rec Bike at home. He was using the equipment daily with his wife at least for 30 min.  Advised him to talk with EP about home exercise and any limitation.     Expected Outcomes Short: Use RPE daily to regulate intensity. Long: Follow program prescription in THR. Short: Continue to follow current exercise prescription. Long: Continue exercise to improve strength and stamina. STG Speaks with EP staff about home exercise plan  LTG is exercing at home and  CR as prescribed              Discharge Exercise Prescription (Final Exercise Prescription Changes):  Exercise Prescription Changes - 08/25/23 1500       Response to Exercise   Blood Pressure (Admit) 140/72    Blood Pressure (Exercise) 152/68    Blood Pressure (Exit) 110/62    Heart Rate (Admit) 119 bpm    Heart Rate (Exercise) 137 bpm    Heart Rate (Exit) 125 bpm    Rating of Perceived Exertion (Exercise) 13    Perceived Dyspnea (Exercise) 0    Symptoms none    Duration Progress to 30 minutes of  aerobic without signs/symptoms of physical distress    Intensity THRR unchanged      Progression   Progression Continue to progress workloads to maintain intensity without signs/symptoms of physical distress.    Average METs 3.07      Resistance Training   Training Prescription Yes    Weight 2 lb    Reps 10-15      Interval Training   Interval Training No      Treadmill   MPH 2.3    Grade 0.5    Minutes 15    METs 2.92      NuStep   Level 3   T6   Minutes 15    METs 2.3      REL-XR   Level 2    Minutes 15    METs 3.4      Oxygen   Maintain Oxygen Saturation 88% or higher             Nutrition:  Target Goals: Understanding of nutrition guidelines, daily intake of sodium 1500mg , cholesterol 200mg , calories 30% from fat and 7% or less from saturated fats, daily to have 5 or more servings of fruits and vegetables.  Education: All About Nutrition: -Group instruction provided by verbal, written material, interactive activities, discussions, models, and posters to present general guidelines for heart healthy nutrition including fat, fiber, MyPlate, the role of sodium in heart healthy nutrition, utilization of the nutrition label, and utilization of this knowledge for meal planning. Follow up email sent as well. Written material given at graduation. Flowsheet Row Cardiac Rehab from 08/05/2023 in [facility] Cardiac and Pulmonary Rehab  Education need identified  08/05/23       Biometrics:  Pre Biometrics - 08/05/23 0913       Pre Biometrics   Height 5' 9.5" (1.765 m)    Weight 146 lb 11.2 oz (66.5 kg)    Waist Circumference 31 inches    Hip Circumference 35 inches    Waist to Hip Ratio 0.89 %    BMI (Calculated) 21.36    Single Leg Stand 8 seconds              Nutrition Therapy Plan and Nutrition Goals:  Nutrition Therapy & Goals - 08/27/23 1211       Nutrition Therapy   RD appointment deferred Yes    Diet deferred meeting with RD 08/26/2023             Nutrition Assessments:  MEDIFICTS Score Key: >=70 Need to make dietary changes  40-70 Heart Healthy Diet <= 40 Therapeutic Level Cholesterol Diet  Flowsheet Row Cardiac Rehab from 08/05/2023 in [facility] Cardiac and Pulmonary Rehab  Picture Your Plate Total Score on Admission 49      Picture Your Plate Scores: <72 Unhealthy dietary pattern  with much room for improvement. 41-50 Dietary pattern unlikely to meet recommendations for good health and room for improvement. 51-60 More healthful dietary pattern, with some room for improvement.  >60 Healthy dietary pattern, although there may be some specific behaviors that could be improved.    Nutrition Goals Re-Evaluation:   Nutrition Goals Discharge (Final Nutrition Goals Re-Evaluation):   Psychosocial: Target Goals: Acknowledge presence or absence of significant depression and/or stress, maximize coping skills, provide positive support system. Participant is able to verbalize types and ability to use techniques and skills needed for reducing stress and depression.   Education: Stress, Anxiety, and Depression - Group verbal and visual presentation to define topics covered.  Reviews how body is impacted by stress, anxiety, and depression.  Also discusses healthy ways to reduce stress and to treat/manage anxiety and depression.  Written material given at graduation.   Education: Sleep Hygiene -Provides group verbal  and written instruction about how sleep can affect your health.  Define sleep hygiene, discuss sleep cycles and impact of sleep habits. Review good sleep hygiene tips.    Initial Review & Psychosocial Screening:  Initial Psych Review & Screening - 07/28/23 1453       Initial Review   Current issues with None Identified      Family Dynamics   Good Support System? Yes   wife     Barriers   Psychosocial barriers to participate in program There are no identifiable barriers or psychosocial needs.      Screening Interventions   Interventions Encouraged to exercise;Provide feedback about the scores to participant;To provide support and resources with identified psychosocial needs    Expected Outcomes Short Term goal: Utilizing psychosocial counselor, staff and physician to assist with identification of specific Stressors or current issues interfering with healing process. Setting desired goal for each stressor or current issue identified.;Long Term Goal: Stressors or current issues are controlled or eliminated.;Short Term goal: Identification and review with participant of any Quality of Life or Depression concerns found by scoring the questionnaire.;Long Term goal: The participant improves quality of Life and PHQ9 Scores as seen by post scores and/or verbalization of changes             Quality of Life Scores:   Quality of Life - 08/05/23 1124       Quality of Life   Select Quality of Life      Quality of Life Scores   Health/Function Pre 28.33 %    Socioeconomic Pre 30 %    Psych/Spiritual Pre 28.29 %    Family Pre 30 %    GLOBAL Pre 28.91 %            Scores of 19 and below usually indicate a poorer quality of life in these areas.  A difference of  2-3 points is a clinically meaningful difference.  A difference of 2-3 points in the total score of the Quality of Life Index has been associated with significant improvement in overall quality of life, self-image, physical  symptoms, and general health in studies assessing change in quality of life.  PHQ-9: Review Flowsheet       08/05/2023  Depression screen PHQ 2/9  Decreased Interest 0  Down, Depressed, Hopeless 0  PHQ - 2 Score 0  Altered sleeping 1  Tired, decreased energy 0  Change in appetite 0  Feeling bad or failure about yourself  0  Trouble concentrating 0  Moving slowly or fidgety/restless 0  Suicidal thoughts 0  PHQ-9  Score 1  Difficult doing work/chores Not difficult at all   Interpretation of Total Score  Total Score Depression Severity:  1-4 = Minimal depression, 5-9 = Mild depression, 10-14 = Moderate depression, 15-19 = Moderately severe depression, 20-27 = Severe depression   Psychosocial Evaluation and Intervention:  Psychosocial Evaluation - 07/28/23 1503       Psychosocial Evaluation & Interventions   Interventions Encouraged to exercise with the program and follow exercise prescription    Comments 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 He states he feels like this surgery has been easier to recover from compared to his previous surgeries. 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 He is retired from [employer]. He has a home gym and up until 6 months ago, was using it consistently. He wants to get back to a regular exercise routine. He states his wife and his 4 dogs are his main support system.    Expected Outcomes Short: attend cardiac rehab for education and exercise. Long: Develop and maintain positive self care habits    Continue Psychosocial Services  Follow up required by staff             Psychosocial Re-Evaluation:  Psychosocial Re-Evaluation     Row Name 08/27/23 1212             Psychosocial Re-Evaluation   Current issues with None Identified       Comments Continues without any psychosocila concerns       Expected Outcomes STG  attends all scheduled session exercise and education  LTG  maintains without concerns       Interventions Encouraged to attend Cardiac Rehabilitation for the exercise       Continue Psychosocial Services  Follow up required by staff                Psychosocial Discharge (Final Psychosocial Re-Evaluation):  Psychosocial Re-Evaluation - 08/27/23 1212       Psychosocial Re-Evaluation   Current issues with None Identified    Comments Continues without any psychosocila concerns    Expected Outcomes STG  attends all scheduled session exercise and education  LTG maintains without concerns    Interventions Encouraged to attend Cardiac Rehabilitation for the exercise    Continue Psychosocial Services  Follow up required by staff             Vocational Rehabilitation: Provide vocational rehab assistance to qualifying candidates.   Vocational Rehab Evaluation & Intervention:  Vocational Rehab - 07/28/23 1453       Initial Vocational Rehab Evaluation & Intervention   Assessment shows need for Vocational Rehabilitation No             Education: Education Goals: Education classes will be provided on a variety of topics geared toward better understanding of heart health and risk factor modification. Participant will state understanding/return demonstration of topics presented as noted by education test scores.  Learning Barriers/Preferences:  Learning Barriers/Preferences - 07/28/23 1453       Learning Barriers/Preferences   Learning Barriers None    Learning Preferences None             General Cardiac Education Topics:  AED/CPR: - Group verbal and written instruction with the use of models to demonstrate the basic use of the AED with the basic ABC's of resuscitation.   Anatomy and Cardiac Procedures: - Group verbal and visual presentation and models provide information  about basic cardiac anatomy and function. Reviews the testing methods done to diagnose heart disease and the outcomes of the test results. Describes the  treatment choices: Medical Management, Angioplasty, or Coronary Bypass Surgery for treating various heart conditions including Myocardial Infarction, Angina, Valve Disease, and Cardiac Arrhythmias.  Written material given at graduation.   Medication Safety: - Group verbal and visual instruction to review commonly prescribed medications for heart and lung disease. Reviews the medication, class of the drug, and side effects. Includes the steps to properly store meds and maintain the prescription regimen.  Written material given at graduation.   Intimacy: - Group verbal instruction through game format to discuss how heart and lung disease can affect sexual intimacy. Written material given at graduation..   Know Your Numbers and Heart Failure: - Group verbal and visual instruction to discuss disease risk factors for cardiac and pulmonary disease and treatment options.  Reviews associated critical values for Overweight/Obesity, Hypertension, Cholesterol, and Diabetes.  Discusses basics of heart failure: signs/symptoms and treatments.  Introduces Heart Failure Zone chart for action plan for heart failure.  Written material given at graduation. Flowsheet Row Cardiac Rehab from 08/05/2023 in [facility] Cardiac and Pulmonary Rehab  Education need identified 08/05/23       Infection Prevention: - Provides verbal and written material to individual with discussion of infection control including proper hand washing and proper equipment cleaning during exercise session. Flowsheet Row Cardiac Rehab from 08/05/2023 in [facility] Cardiac and Pulmonary Rehab  Date 08/05/23  Educator NT  Instruction Review Code 1- Verbalizes Understanding       Falls Prevention: - Provides verbal and written material to individual with discussion of falls prevention and safety. Flowsheet Row Cardiac Rehab from 08/05/2023 in [facility] Cardiac and Pulmonary Rehab  Date 08/05/23  Educator NT  Instruction Review Code 1- Verbalizes  Understanding       Other: -Provides group and verbal instruction on various topics (see comments)   Knowledge Questionnaire Score:  Knowledge Questionnaire Score - 08/05/23 1123       Knowledge Questionnaire Score   Pre Score 22/26             Core Components/Risk Factors/Patient Goals at Admission:  Personal Goals and Risk Factors at Admission - 07/28/23 1450       Core Components/Risk Factors/Patient Goals on Admission    Weight Management Yes;Weight Maintenance    Intervention Weight Management: Develop a combined nutrition and exercise program designed to reach desired caloric intake, while maintaining appropriate intake of nutrient and fiber, sodium and fats, and appropriate energy expenditure required for the weight goal.;Weight Management: Provide education and appropriate resources to help participant work on and attain dietary goals.    Expected Outcomes Weight Maintenance: Understanding of the daily nutrition guidelines, which includes 25-35% calories from fat, 7% or less cal from saturated fats, less than 200mg  cholesterol, less than 1.5gm of sodium, & 5 or more servings of fruits and vegetables daily;Understanding of distribution of calorie intake throughout the day with the consumption of 4-5 meals/snacks;Understanding recommendations for meals to include 15-35% energy as protein, 25-35% energy from fat, 35-60% energy from carbohydrates, less than 200mg  of dietary cholesterol, 20-35 gm of total fiber daily    Hypertension Yes    Intervention Provide education on lifestyle modifcations including regular physical activity/exercise, weight management, moderate sodium restriction and increased consumption of fresh fruit, vegetables, and low fat dairy, alcohol moderation, and smoking cessation.;Monitor prescription use compliance.    Expected Outcomes Short Term: Continued assessment  and intervention until BP is < 140/54mm HG in hypertensive participants. < 130/79mm HG in  hypertensive participants with diabetes, heart failure or chronic kidney disease.;Long Term: Maintenance of blood pressure at goal levels.    Lipids Yes    Intervention Provide education and support for participant on nutrition & aerobic/resistive exercise along with prescribed medications to achieve LDL 70mg , HDL >40mg .    Expected Outcomes Short Term: Participant states understanding of desired cholesterol values and is compliant with medications prescribed. Participant is following exercise prescription and nutrition guidelines.;Long Term: Cholesterol controlled with medications as prescribed, with individualized exercise RX and with personalized nutrition plan. Value goals: LDL < 70mg , HDL > 40 mg.             Education:Diabetes - Individual verbal and written instruction to review signs/symptoms of diabetes, desired ranges of glucose level fasting, after meals and with exercise. Acknowledge that pre and post exercise glucose checks will be done for 3 sessions at entry of program.   Core Components/Risk Factors/Patient Goals Review:   Goals and Risk Factor Review     Row Name 08/27/23 1213             Core Components/Risk Factors/Patient Goals Review   Personal Goals Review Weight Management/Obesity;Lipids       Review Brad Chen has lost weight and wants to gain back to 190lB, he is at 146 lb today. He was required muscle mass for work and is trying to gain this back.  He is compliant with his meds for the lipid control and does monitor intake of fats and is exercising to help with risk factor control.       Expected Outcomes STG   Continue with exercise prescrotion as tolerated, monitor fat intake for lipid control. maintain compliance with meds  LTG Maintiang healthy lifestyle after discharge                Core Components/Risk Factors/Patient Goals at Discharge (Final Review):   Goals and Risk Factor Review - 08/27/23 1213       Core Components/Risk Factors/Patient Goals  Review   Personal Goals Review Weight Management/Obesity;Lipids    Review Brad Chen has lost weight and wants to gain back to 190lB, he is at 146 lb today. He was required muscle mass for work and is trying to gain this back.  He is compliant with his meds for the lipid control and does monitor intake of fats and is exercising to help with risk factor control.    Expected Outcomes STG   Continue with exercise prescrotion as tolerated, monitor fat intake for lipid control. maintain compliance with meds  LTG Maintiang healthy lifestyle after discharge             ITP Comments:  ITP Comments     Row Name 07/28/23 1501 08/05/23 0909 08/10/23 0929 09/02/23 0959     ITP Comments Initial phone call completed. Diagnosis can be found in [facility] 10/20. EP Orientation scheduled for Thursday 11/14 at 9am. Completed and gym orientation. Initial ITP created and sent for review to Dr. Bethann Punches, Medical Director. First full day of exercise!  Patient was oriented to gym and equipment including functions, settings, policies, and procedures.  Patient's individual exercise prescription and treatment plan were reviewed.  All starting workloads were established based on the results of the 6 minute walk test done at initial orientation visit.  The plan for exercise progression was also introduced and progression will be customized based on patient's performance  and goals. 30 Day review completed. Medical Director ITP review done, changes made as directed, and signed approval by Medical Director.    new to program             Comments:

## 2023-09-02 NOTE — Progress Notes (Signed)
Daily Session Note  Patient Details  Name: Brad Chen MRN: [id number] Date of Birth: 06/28/1961 Referring Provider:   Flowsheet Row Cardiac Rehab from 08/05/2023 in [facility] Cardiac and Pulmonary Rehab  Referring Provider Dr. Einar Crow, MD       Encounter Date: 09/02/2023  Check In:  Session Check In - 09/02/23 0949       Check-In   Supervising physician immediately available to respond to emergencies See telemetry face sheet for immediately available ER MD    Location [facility]-Cardiac & Pulmonary Rehab    Staff Present Elige Ko, RCP,RRT,BSRT;Krista Karleen Hampshire RN, BSN;Maxon Conetta BS, , Exercise Physiologist;Mirissa Lopresti Katrinka Blazing, RN, ADN    Virtual Visit No    Medication changes reported     No    Fall or balance concerns reported    No    Warm-up and Cool-down Performed on first and last piece of equipment    Resistance Training Performed Yes    VAD Patient? No    PAD/SET Patient? No      Pain Assessment   Currently in Pain? No/denies                Social History   Tobacco Use  Smoking Status Former   Average packs/day: 1 pack/day for 33.0 years (33.0 ttl pk-yrs)   Types: Cigarettes   Start date: 1990  Smokeless Tobacco Never    Goals Met:  Independence with exercise equipment Exercise tolerated well No report of concerns or symptoms today Strength training completed today  Goals Unmet:  Not Applicable  Comments: Pt able to follow exercise prescription today without complaint.  Will continue to monitor for progression.    Dr. Bethann Punches is Medical Director for [facility] Cardiac Rehabilitation.  Dr. Vida Rigger is Medical Director for [facility] Pulmonary Rehabilitation.

## 2023-09-03 ENCOUNTER — Encounter (INDEPENDENT_AMBULATORY_CARE_PROVIDER_SITE_OTHER): Payer: Self-pay | Admitting: Vascular Surgery

## 2023-09-04 ENCOUNTER — Encounter: Payer: PRIVATE HEALTH INSURANCE | Admitting: *Deleted

## 2023-09-04 DIAGNOSIS — Z951 Presence of aortocoronary bypass graft: Secondary | ICD-10-CM

## 2023-09-04 NOTE — Progress Notes (Signed)
Daily Session Note  Patient Details  Name: Brad Chen MRN: [id number] Date of Birth: March 28, 1961 Referring Provider:   Flowsheet Row Cardiac Rehab from 08/05/2023 in [facility] Cardiac and Pulmonary Rehab  Referring Provider Dr. Einar Crow, MD       Encounter Date: 09/04/2023  Check In:  Session Check In - 09/04/23 0948       Check-In   Supervising physician immediately available to respond to emergencies See telemetry face sheet for immediately available ER MD    Location [facility]-Cardiac & Pulmonary Rehab    Staff Present Cora Collum, RN, BSN, CCRP;Noah Tickle, BS, Exercise Physiologist;Joseph Inglis, [place]    Virtual Visit No    Medication changes reported     No    Fall or balance concerns reported    No    Warm-up and Cool-down Performed on first and last piece of equipment    Resistance Training Performed Yes    VAD Patient? No    PAD/SET Patient? No      Pain Assessment   Currently in Pain? No/denies                Social History   Tobacco Use  Smoking Status Former   Average packs/day: 1 pack/day for 33.0 years (33.0 ttl pk-yrs)   Types: Cigarettes   Start date: 1990  Smokeless Tobacco Never    Goals Met:  Independence with exercise equipment Exercise tolerated well No report of concerns or symptoms today  Goals Unmet:  Not Applicable  Comments: Pt able to follow exercise prescription today without complaint.  Will continue to monitor for progression.    Dr. Bethann Punches is Medical Director for [facility] Cardiac Rehabilitation.  Dr. Vida Rigger is Medical Director for [facility] Pulmonary Rehabilitation.

## 2023-09-07 ENCOUNTER — Encounter: Payer: PRIVATE HEALTH INSURANCE | Admitting: *Deleted

## 2023-09-07 DIAGNOSIS — Z951 Presence of aortocoronary bypass graft: Secondary | ICD-10-CM | POA: Diagnosis not present

## 2023-09-07 NOTE — Progress Notes (Signed)
Daily Session Note  Patient Details  Name: Brad Chen MRN: [id number] Date of Birth: [birth year] Referring Provider:   Flowsheet Row Cardiac Rehab from 08/05/2023 in [facility] Cardiac and Pulmonary Rehab  Referring Provider Dr. Einar Crow, MD       Encounter Date: 09/07/2023  Check In:  Session Check In - 09/07/23 0950       Check-In   Supervising physician immediately available to respond to emergencies See telemetry face sheet for immediately available ER MD    Location [facility]-Cardiac & Pulmonary Rehab    Staff Present Cora Collum, RN, BSN, CCRP;Kristen Coble, RN,BC,MSN;Margaret Best, MS, Exercise Physiologist;Megan Katrinka Blazing, RN, ADN    Virtual Visit No    Medication changes reported     No    Fall or balance concerns reported    No    Warm-up and Cool-down Performed on first and last piece of equipment    Resistance Training Performed Yes    VAD Patient? No    PAD/SET Patient? No      Pain Assessment   Currently in Pain? No/denies                Social History   Tobacco Use  Smoking Status Former   Average packs/day: 1 pack/day for 33.0 years (33.0 ttl pk-yrs)   Types: Cigarettes   Start date: 1990  Smokeless Tobacco Never    Goals Met:  Independence with exercise equipment Exercise tolerated well No report of concerns or symptoms today  Goals Unmet:  Not Applicable  Comments: Pt able to follow exercise prescription today without complaint.  Will continue to monitor for progression.    Dr. Bethann Punches is Medical Director for [facility] Cardiac Rehabilitation.  Dr. Vida Rigger is Medical Director for [facility] Pulmonary Rehabilitation.

## 2023-09-11 ENCOUNTER — Encounter: Payer: PRIVATE HEALTH INSURANCE | Admitting: *Deleted

## 2023-09-11 DIAGNOSIS — Z951 Presence of aortocoronary bypass graft: Secondary | ICD-10-CM | POA: Diagnosis not present

## 2023-09-11 NOTE — Progress Notes (Signed)
Daily Session Note  Patient Details  Name: SNOW FLEEGER MRN: [id number] Date of Birth: [birth year] Referring Provider:   Flowsheet Row Cardiac Rehab from 08/05/2023 in [facility] Cardiac and Pulmonary Rehab  Referring Provider Dr. Einar Crow, MD       Encounter Date: 09/11/2023  Check In:  Session Check In - 09/11/23 0959       Check-In   Supervising physician immediately available to respond to emergencies See telemetry face sheet for immediately available ER MD    Location [facility]-Cardiac & Pulmonary Rehab    Staff Present Cora Collum, RN, BSN, CCRP;Joseph Hood, RCP,RRT,BSRT;Noah Tickle, [place], Exercise Physiologist    Virtual Visit No    Medication changes reported     No    Fall or balance concerns reported    No    Warm-up and Cool-down Performed on first and last piece of equipment    Resistance Training Performed Yes    VAD Patient? No    PAD/SET Patient? No      Pain Assessment   Currently in Pain? No/denies                Social History   Tobacco Use  Smoking Status Former   Average packs/day: 1 pack/day for 33.0 years (33.0 ttl pk-yrs)   Types: Cigarettes   Start date: 1990  Smokeless Tobacco Never    Goals Met:  Independence with exercise equipment Exercise tolerated well No report of concerns or symptoms today  Goals Unmet:  Not Applicable  Comments: Pt able to follow exercise prescription today without complaint.  Will continue to monitor for progression.    Dr. Bethann Punches is Medical Director for [facility] Cardiac Rehabilitation.  Dr. Vida Rigger is Medical Director for [facility] Pulmonary Rehabilitation.

## 2023-09-14 ENCOUNTER — Encounter: Payer: PRIVATE HEALTH INSURANCE | Admitting: *Deleted

## 2023-09-21 ENCOUNTER — Encounter: Payer: PRIVATE HEALTH INSURANCE | Admitting: *Deleted

## 2023-09-23 ENCOUNTER — Encounter: Payer: PRIVATE HEALTH INSURANCE | Admitting: *Deleted

## 2023-09-28 ENCOUNTER — Encounter: Payer: PRIVATE HEALTH INSURANCE | Attending: Internal Medicine | Admitting: *Deleted

## 2023-09-28 DIAGNOSIS — Z951 Presence of aortocoronary bypass graft: Secondary | ICD-10-CM | POA: Insufficient documentation

## 2023-09-28 NOTE — Progress Notes (Signed)
 Daily Session Note  Patient Details  Name: Brad Chen MRN: [id number] Date of Birth: 1960-12-12 Referring Provider:   Flowsheet Row Cardiac Rehab from 08/05/2023 in [facility] Cardiac and Pulmonary Rehab  Referring Provider Dr. Layman Piety, MD       Encounter Date: 09/28/2023  Check In:  Session Check In - 09/28/23 0950       Check-In   Supervising physician immediately available to respond to emergencies See telemetry face sheet for immediately available ER MD    Location [facility]-Cardiac & Pulmonary Rehab    Staff Present Maxon Conetta BS, , Exercise Physiologist;Georgann Bramble Dyane, BS, ACSM CEP, Exercise Physiologist;Margaret Best, MS, Exercise Physiologist;Joseph Detmold, RCP,RRT,BSRT;Other   Burnard Davenport, RN   Virtual Visit No    Medication changes reported     No    Fall or balance concerns reported    No    Tobacco Cessation No Change    Warm-up and Cool-down Performed on first and last piece of equipment    Resistance Training Performed Yes    VAD Patient? No    PAD/SET Patient? No      Pain Assessment   Currently in Pain? No/denies                Social History   Tobacco Use  Smoking Status Former   Average packs/day: 1 pack/day for 33.0 years (33.0 ttl pk-yrs)   Types: Cigarettes   Start date: 1990  Smokeless Tobacco Never    Goals Met:  Independence with exercise equipment Exercise tolerated well No report of concerns or symptoms today Strength training completed today  Goals Unmet:  Not Applicable  Comments: Pt able to follow exercise prescription today without complaint. Will continue to monitor for progression.    Dr. Oneil Pinal is Medical Director for [facility] Cardiac Rehabilitation.  Dr. Fuad Aleskerov is Medical Director for [facility] Pulmonary Rehabilitation.

## 2023-09-30 ENCOUNTER — Encounter: Payer: PRIVATE HEALTH INSURANCE | Admitting: *Deleted

## 2023-09-30 ENCOUNTER — Encounter: Payer: Self-pay | Admitting: *Deleted

## 2023-09-30 DIAGNOSIS — Z951 Presence of aortocoronary bypass graft: Secondary | ICD-10-CM

## 2023-09-30 NOTE — Progress Notes (Signed)
 Daily Session Note  Patient Details  Name: Brad Chen MRN: [id number] Date of Birth: [birth year] Referring Provider:   Flowsheet Row Cardiac Rehab from 08/05/2023 in [facility] Cardiac and Pulmonary Rehab  Referring Provider Dr. Overton Blotter, MD       Encounter Date: 09/30/2023  Check In:  Session Check In - 09/30/23 1013       Check-In   Supervising physician immediately available to respond to emergencies See telemetry face sheet for immediately available ER MD    Location [facility]-Cardiac & Pulmonary Rehab    Staff Present Maud Sorenson, RN, BSN, CCRP;Meredith Manson Seitz RN,BSN;Joseph Dumas RCP,RRT,BSRT    Virtual Visit No    Medication changes reported     No    Fall or balance concerns reported    No    Warm-up and Cool-down Performed on first and last piece of equipment    Resistance Training Performed Yes    VAD Patient? No    PAD/SET Patient? No      Pain Assessment   Currently in Pain? No/denies                Social History   Tobacco Use  Smoking Status Former   Average packs/day: 1 pack/day for 33.0 years (33.0 ttl pk-yrs)   Types: Cigarettes   Start date: 1990  Smokeless Tobacco Never    Goals Met:  Independence with exercise equipment Exercise tolerated well No report of concerns or symptoms today  Goals Unmet:  Not Applicable  Comments: Pt able to follow exercise prescription today without complaint.  Will continue to monitor for progression.    Dr. Firman Hughes is Medical Director for [facility] Cardiac Rehabilitation.  Dr. Fuad Aleskerov is Medical Director for [facility] Pulmonary Rehabilitation.

## 2023-09-30 NOTE — Progress Notes (Signed)
 Cardiac Individual Treatment Plan  Patient Details  Name: Brad Chen MRN: [id number] Date of Birth: 1961-01-01 Referring Provider:   Flowsheet Row Cardiac Rehab from 08/05/2023 in [facility] Cardiac and Pulmonary Rehab  Referring Provider Dr. Overton Blotter, MD       Initial Encounter Date:  Flowsheet Row Cardiac Rehab from 08/05/2023 in [facility] Cardiac and Pulmonary Rehab  Date 08/05/23       Visit Diagnosis: S/P CABG x 3  Patient's Home Medications on Admission:  Current Outpatient Medications:    Ascorbic Acid  (VITAMIN C  PO), Take 1 tablet by mouth See admin instructions. Takes when he remembers, Disp: , Rfl:    aspirin  EC 81 MG tablet, Take 1 tablet (81 mg total) by mouth daily. Swallow whole., Disp: 150 tablet, Rfl: 2   atorvastatin  (LIPITOR) 10 MG tablet, Take 1 tablet (10 mg total) by mouth daily., Disp: 30 tablet, Rfl: 11   clopidogrel  (PLAVIX ) 75 MG tablet, Take 1 tablet (75 mg total) by mouth daily., Disp: 30 tablet, Rfl: 11   metoprolol  tartrate (LOPRESSOR ) 50 MG tablet, Take 50 mg (1 tablet) TWO hours prior to CT scan, Disp: 1 tablet, Rfl: 0   Multiple Vitamins-Minerals (MULTIVITAMIN ADULTS PO), Take 1 tablet by mouth See admin instructions. When he remembers, Disp: , Rfl:    oxyCODONE -acetaminophen  (PERCOCET) 5-325 MG tablet, Take 1-2 tablets by mouth every 6 (six) hours as needed for severe pain. 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   a.) s/p partial pharyngectomy, small FOM lesion resection, biodesign placement 11/25/2022 --> pathology (+) for 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); p16 and HPV (-)   Status post carpal tunnel release of both wrists    Subclavian artery stenosis, left (HCC) 01/2023   a.) s/p PTA and stenting 02/13/2023--> tandem proximal LEFT subclavian and LEFT axillary artery stents   TIA (transient ischemic attack) 2018   Torus  mandibularis    a.) s/p excision 11/25/2022    Tobacco Use: Social History   Tobacco Use  Smoking Status Former   Average packs/day: 1 pack/day for 33.0 years (33.0 ttl pk-yrs)   Types: Cigarettes   Start date: 1990  Smokeless Tobacco Never    Labs: Review Flowsheet        No data to display           Exercise Target Goals: Exercise Program Goal: Individual exercise prescription set using results from initial 6 min walk test and THRR while considering  patient's activity barriers and safety.   Exercise Prescription  Goal: Initial exercise prescription builds to 30-45 minutes a day of aerobic activity, 2-3 days per week.  Home exercise guidelines will be given to patient during program as part of exercise prescription that the participant will acknowledge.   Education: Aerobic Exercise: - Group verbal and visual presentation on the components of exercise prescription. Introduces F.I.T.T principle from ACSM for exercise prescriptions.  Reviews F.I.T.T. principles of aerobic exercise including progression. Written material given at graduation. Flowsheet Row Cardiac Rehab from 08/05/2023 in [facility] Cardiac and Pulmonary Rehab  Education need identified 08/05/23       Education: Resistance Exercise: - Group verbal and visual presentation on the components of exercise prescription. Introduces F.I.T.T principle from ACSM for exercise prescriptions  Reviews F.I.T.T. principles of resistance exercise including progression. Written material given at graduation.    Education: Exercise & Equipment Safety: - Individual verbal instruction and demonstration of equipment use and safety with use of the equipment. Flowsheet Row Cardiac Rehab from 08/05/2023 in [facility] Cardiac and Pulmonary Rehab  Date 08/05/23  Educator NT  Instruction Review Code 1- Verbalizes Understanding       Education: Exercise Physiology & General Exercise Guidelines: - Group verbal and written instruction with models to review the exercise physiology of the cardiovascular system and associated critical values. Provides general exercise guidelines with specific guidelines to those with heart or lung disease.    Education: Flexibility, Balance, Mind/Body Relaxation: - Group verbal and visual presentation with interactive activity on the components of exercise prescription. Introduces F.I.T.T principle from ACSM for exercise prescriptions. Reviews F.I.T.T. principles of flexibility and balance exercise training including progression. Also discusses  the mind body connection.  Reviews various relaxation techniques to help reduce and manage stress (i.e. Deep breathing, progressive muscle relaxation, and visualization). Balance handout provided to take home. Written material given at graduation.   Activity Barriers & Risk Stratification:  Activity Barriers & Cardiac Risk Stratification - 08/05/23 0912       Activity Barriers & Cardiac Risk Stratification   Activity Barriers Joint Problems;Other (comment)    Comments Hx Shoulder Surgery, Neuropathy in feet    Cardiac Risk Stratification High             6 Minute Walk:  6 Minute Walk     Row Name 08/05/23 0910         6 Minute Walk   Phase Initial     Distance 1330 feet     Walk Time 6 minutes     # of Rest Breaks 0     MPH 2.52     METS 4.04     RPE 6     Perceived Dyspnea  0     VO2 Peak 14.14     Symptoms No     Resting HR 62 bpm     Resting BP 152/74     Resting Oxygen  Saturation  100 %     Exercise Oxygen Saturation  during 6 min walk 99 %     Max Ex. HR 103 bpm     Max Ex. BP 164/76     2 Minute Post BP 158/76              Oxygen Initial Assessment:   Oxygen Re-Evaluation:  Oxygen Re-Evaluation     Row Name 08/10/23 0930             Goals/Expected Outcomes   Comments Reviewed PLB technique with pt.  Talked about how it works and it's importance in maintaining their exercise saturations.       Goals/Expected Outcomes Short: Become more profiecient at using PLB. Long: Become independent at using PLB.                Oxygen Discharge (Final Oxygen Re-Evaluation):  Oxygen Re-Evaluation - 08/10/23 0930       Goals/Expected Outcomes   Comments Reviewed PLB technique with pt.  Talked about how it works and it's importance in maintaining their exercise saturations.    Goals/Expected Outcomes Short: Become more profiecient at using PLB. Long: Become independent at using PLB.             Initial Exercise Prescription:  Initial Exercise  Prescription - 08/05/23 1100       Date of Initial Exercise RX and Referring Provider   Date 08/05/23    Referring Provider Dr. Overton Blotter, MD      Oxygen   Maintain Oxygen Saturation 88% or higher      Treadmill   MPH 2.5    Grade 1    Minutes 15    METs 3.26      NuStep   Level 3   T6 nustep   SPM 80    Minutes 15    METs 4.04      REL-XR   Level 3    Speed 50    Minutes 15    METs 4.04      Prescription Details   Frequency (times per week) 3    Duration Progress to 30 minutes of continuous aerobic without signs/symptoms of physical distress      Intensity   THRR 40-80% of Max Heartrate 100-138    Ratings of Perceived Exertion 11-13    Perceived Dyspnea 0-4      Progression   Progression Continue to progress workloads to maintain intensity without signs/symptoms of physical distress.      Resistance Training   Training Prescription Yes    Weight 2 lb    Reps 10-15             Perform Capillary Blood Glucose checks as needed.  Exercise Prescription Changes:   Exercise Prescription Changes     Row Name 08/05/23 1100 08/25/23 1500 09/11/23 0700         Response to Exercise   Blood Pressure (Admit) 152/74 140/72 128/72     Blood Pressure (Exercise) 164/76 152/68 140/72     Blood Pressure (Exit) 158/76 110/62 122/62     Heart Rate (Admit) 62 bpm 119 bpm 104 bpm     Heart Rate (Exercise) 103 bpm 137 bpm 131 bpm     Heart Rate (Exit) 67 bpm 125 bpm 83 bpm     Oxygen Saturation (Admit) 100 % -- --     Oxygen Saturation (Exercise) 99 % -- --     Rating of Perceived Exertion (Exercise) 6  13 13     Perceived Dyspnea (Exercise) 0 0 --     Symptoms none none none     Comments Results -- --     Duration -- Progress to 30 minutes of  aerobic without signs/symptoms of physical distress Continue with 30 min of aerobic exercise without signs/symptoms of physical distress.     Intensity -- THRR unchanged THRR unchanged       Progression    Progression -- Continue to progress workloads to maintain intensity without signs/symptoms of physical distress. Continue to progress workloads to maintain intensity without signs/symptoms of physical distress.     Average METs -- 3.07 3.21       Resistance Training   Training Prescription -- Yes Yes     Weight -- 2 lb 2 lb     Reps -- 10-15 10-15       Interval Training   Interval Training -- No No       Treadmill   MPH -- 2.3 2.6     Grade -- 0.5 0     Minutes -- 15 15     METs -- 2.92 2.99       NuStep   Level -- 3  T6 2  T6 nustep     Minutes -- 15 15     METs -- 2.3 2.84       REL-XR   Level -- 2 5     Minutes -- 15 15     METs -- 3.4 3.5       Oxygen   Maintain Oxygen Saturation -- 88% or higher 88% or higher              Exercise Comments:   Exercise Comments     Row Name 08/10/23 6578           Exercise Comments First full day of exercise!  Patient was oriented to gym and equipment including functions, settings, policies, and procedures.  Patient's individual exercise prescription and treatment plan were reviewed.  All starting workloads were established based on the results of the 6 minute walk test done at initial orientation visit.  The plan for exercise progression was also introduced and progression will be customized based on patient's performance and goals.                Exercise Goals and Review:   Exercise Goals     Row Name 08/05/23 0912             Exercise Goals   Increase Physical Activity Yes       Intervention Provide advice, education, support and counseling about physical activity/exercise needs.;Develop an individualized exercise prescription for aerobic and resistive training based on initial evaluation findings, risk stratification, comorbidities and participant's personal goals.       Expected Outcomes Short Term: Attend rehab on a regular basis to increase amount of physical activity.;Long Term: Add in home exercise to  make exercise part of routine and to increase amount of physical activity.;Long Term: Exercising regularly at least 3-5 days a week.       Increase Strength and Stamina Yes       Intervention Provide advice, education, support and counseling about physical activity/exercise needs.;Develop an individualized exercise prescription for aerobic and resistive training based on initial evaluation findings, risk stratification, comorbidities and participant's personal goals.       Expected Outcomes Short Term: Increase workloads from initial exercise prescription for resistance, speed, and METs.;Short Term:  Perform resistance training exercises routinely during rehab and add in resistance training at home;Long Term: Improve cardiorespiratory fitness, muscular endurance and strength as measured by increased METs and functional capacity ( )       Able to understand and use rate of perceived exertion (RPE) scale Yes       Intervention Provide education and explanation on how to use RPE scale       Expected Outcomes Short Term: Able to use RPE daily in rehab to express subjective intensity level;Long Term:  Able to use RPE to guide intensity level when exercising independently       Able to understand and use Dyspnea scale Yes       Intervention Provide education and explanation on how to use Dyspnea scale       Expected Outcomes Short Term: Able to use Dyspnea scale daily in rehab to express subjective sense of shortness of breath during exertion;Long Term: Able to use Dyspnea scale to guide intensity level when exercising independently       Knowledge and understanding of Target Heart Rate Range (THRR) Yes       Intervention Provide education and explanation of THRR including how the numbers were predicted and where they are located for reference       Expected Outcomes Long Term: Able to use THRR to govern intensity when exercising independently;Short Term: Able to state/look up THRR;Short Term: Able to use  daily as guideline for intensity in rehab       Able to check pulse independently Yes       Intervention Provide education and demonstration on how to check pulse in carotid and radial arteries.;Review the importance of being able to check your own pulse for safety during independent exercise       Expected Outcomes Short Term: Able to explain why pulse checking is important during independent exercise;Long Term: Able to check pulse independently and accurately       Understanding of Exercise Prescription Yes       Intervention Provide education, explanation, and written materials on patient's individual exercise prescription       Expected Outcomes Short Term: Able to explain program exercise prescription;Long Term: Able to explain home exercise prescription to exercise independently                Exercise Goals Re-Evaluation :  Exercise Goals Re-Evaluation     Row Name 08/10/23 0929 08/25/23 1540 08/27/23 1209 09/11/23 0743 09/24/23 1536     Exercise Goal Re-Evaluation   Exercise Goals Review Able to understand and use rate of perceived exertion (RPE) scale;Able to understand and use Dyspnea scale;Knowledge and understanding of Target Heart Rate Range (THRR);Understanding of Exercise Prescription Increase Physical Activity;Increase Strength and Stamina;Understanding of Exercise Prescription Increase Physical Activity Increase Physical Activity;Increase Strength and Stamina;Understanding of Exercise Prescription Increase Physical Activity;Increase Strength and Stamina;Understanding of Exercise Prescription   Comments Reviewed RPE and dyspnea scale, THR and program prescription with pt today.  Pt voiced understanding and was given a copy of goals to take home. Brad Chen is off to a good start in the program. He continues to get familiar with his exercise prescription and the modalities involved. He was able to use his prescribed machines at their prescribed intensity and we will continue to monitor  his progression in the program. Brad Chen wants to get back to using his TM and Rec Bike at home. He was using the equipment daily with his wife at least for 30 min.  Advised  him to talk with EP about home exercise and any limitation. Brad Chen is doing well in rehab. He recently increased his speed on the treadmill to 2.6 mph with no incline. He also improved to level 5 on the XR and continues to work at level 2 on the T6 nustep. We will continue to monitor his progress in the program. Brad Chen has not attended rehab during this review. His last visit was 12/27. We will reach out in order to determine his status.   Expected Outcomes Short: Use RPE daily to regulate intensity. Long: Follow program prescription in THR. Short: Continue to follow current exercise prescription. Long: Continue exercise to improve strength and stamina. STG Speaks with EP staff about home exercise plan  LTG is exercing at home and CR as prescribed Short: Continue to progressively increase treadmill workload. Long: Continue exercise to improve strength and stamina. Short: Return to rehab. Long: Continue exercise to improve strength and stamina.            Discharge Exercise Prescription (Final Exercise Prescription Changes):  Exercise Prescription Changes - 09/11/23 0700       Response to Exercise   Blood Pressure (Admit) 128/72    Blood Pressure (Exercise) 140/72    Blood Pressure (Exit) 122/62    Heart Rate (Admit) 104 bpm    Heart Rate (Exercise) 131 bpm    Heart Rate (Exit) 83 bpm    Rating of Perceived Exertion (Exercise) 13    Symptoms none    Duration Continue with 30 min of aerobic exercise without signs/symptoms of physical distress.    Intensity THRR unchanged      Progression   Progression Continue to progress workloads to maintain intensity without signs/symptoms of physical distress.    Average METs 3.21      Resistance Training   Training Prescription Yes    Weight 2 lb    Reps 10-15      Interval  Training   Interval Training No      Treadmill   MPH 2.6    Grade 0    Minutes 15    METs 2.99      NuStep   Level 2   T6 nustep   Minutes 15    METs 2.84      REL-XR   Level 5    Minutes 15    METs 3.5      Oxygen   Maintain Oxygen Saturation 88% or higher             Nutrition:  Target Goals: Understanding of nutrition guidelines, daily intake of sodium 1500mg , cholesterol 200mg , calories 30% from fat and 7% or less from saturated fats, daily to have 5 or more servings of fruits and vegetables.  Education: All About Nutrition: -Group instruction provided by verbal, written material, interactive activities, discussions, models, and posters to present general guidelines for heart healthy nutrition including fat, fiber, MyPlate, the role of sodium in heart healthy nutrition, utilization of the nutrition label, and utilization of this knowledge for meal planning. Follow up email sent as well. Written material given at graduation. Flowsheet Row Cardiac Rehab from 08/05/2023 in [facility] Cardiac and Pulmonary Rehab  Education need identified 08/05/23       Biometrics:  Pre Biometrics - 08/05/23 0913       Pre Biometrics   Height 5' 9.5" (1.765 m)    Weight 146 lb 11.2 oz (66.5 kg)    Waist Circumference 31 inches    Hip Circumference 35  inches    Waist to Hip Ratio 0.89 %    BMI (Calculated) 21.36    Single Leg Stand 8 seconds              Nutrition Therapy Plan and Nutrition Goals:  Nutrition Therapy & Goals - 08/27/23 1211       Nutrition Therapy   RD appointment deferred Yes    Diet deferred meeting with RD 08/26/2023             Nutrition Assessments:  MEDIFICTS Score Key: >=70 Need to make dietary changes  40-70 Heart Healthy Diet <= 40 Therapeutic Level Cholesterol Diet  Flowsheet Row Cardiac Rehab from 08/05/2023 in [facility] Cardiac and Pulmonary Rehab  Picture Your Plate Total Score on Admission 49      Picture Your Plate Scores: <95  Unhealthy dietary pattern with much room for improvement. 41-50 Dietary pattern unlikely to meet recommendations for good health and room for improvement. 51-60 More healthful dietary pattern, with some room for improvement.  >60 Healthy dietary pattern, although there may be some specific behaviors that could be improved.    Nutrition Goals Re-Evaluation:  Nutrition Goals Re-Evaluation     Row Name 09/28/23 0937             Goals   Comment deferred RD appointment                Nutrition Goals Discharge (Final Nutrition Goals Re-Evaluation):  Nutrition Goals Re-Evaluation - 09/28/23 0937       Goals   Comment deferred RD appointment             Psychosocial: Target Goals: Acknowledge presence or absence of significant depression and/or stress, maximize coping skills, provide positive support system. Participant is able to verbalize types and ability to use techniques and skills needed for reducing stress and depression.   Education: Stress, Anxiety, and Depression - Group verbal and visual presentation to define topics covered.  Reviews how body is impacted by stress, anxiety, and depression.  Also discusses healthy ways to reduce stress and to treat/manage anxiety and depression.  Written material given at graduation.   Education: Sleep Hygiene -Provides group verbal and written instruction about how sleep can affect your health.  Define sleep hygiene, discuss sleep cycles and impact of sleep habits. Review good sleep hygiene tips.    Initial Review & Psychosocial Screening:  Initial Psych Review & Screening - 07/28/23 1453       Initial Review   Current issues with None Identified      Family Dynamics   Good Support System? Yes   wife     Barriers   Psychosocial barriers to participate in program There are no identifiable barriers or psychosocial needs.      Screening Interventions   Interventions Encouraged to exercise;Provide feedback about the  scores to participant;To provide support and resources with identified psychosocial needs    Expected Outcomes Short Term goal: Utilizing psychosocial counselor, staff and physician to assist with identification of specific Stressors or current issues interfering with healing process. Setting desired goal for each stressor or current issue identified.;Long Term Goal: Stressors or current issues are controlled or eliminated.;Short Term goal: Identification and review with participant of any Quality of Life or Depression concerns found by scoring the questionnaire.;Long Term goal: The participant improves quality of Life and PHQ9 Scores as seen by post scores and/or verbalization of changes             Quality  of Life Scores:   Quality of Life - 08/05/23 1124       Quality of Life   Select Quality of Life      Quality of Life Scores   Health/Function Pre 28.33 %    Socioeconomic Pre 30 %    Psych/Spiritual Pre 28.29 %    Family Pre 30 %    GLOBAL Pre 28.91 %            Scores of 19 and below usually indicate a poorer quality of life in these areas.  A difference of  2-3 points is a clinically meaningful difference.  A difference of 2-3 points in the total score of the Quality of Life Index has been associated with significant improvement in overall quality of life, self-image, physical symptoms, and general health in studies assessing change in quality of life.  PHQ-9: Review Flowsheet       08/05/2023  Depression screen PHQ 2/9  Decreased Interest 0  Down, Depressed, Hopeless 0  PHQ - 2 Score 0  Altered sleeping 1  Tired, decreased energy 0  Change in appetite 0  Feeling bad or failure about yourself  0  Trouble concentrating 0  Moving slowly or fidgety/restless 0  Suicidal thoughts 0  PHQ-9 Score 1  Difficult doing work/chores Not difficult at all   Interpretation of Total Score  Total Score Depression Severity:  1-4 = Minimal depression, 5-9 = Mild depression,  10-14 = Moderate depression, 15-19 = Moderately severe depression, 20-27 = Severe depression   Psychosocial Evaluation and Intervention:  Psychosocial Evaluation - 07/28/23 1503       Psychosocial Evaluation & Interventions   Interventions Encouraged to exercise with the program and follow exercise prescription    Comments 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 He states he feels like this surgery has been easier to recover from compared to his previous surgeries. 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 He is retired from [employer]. He has a home gym and up until 6 months ago, was using it consistently. He wants to get back to a regular exercise routine. He states his wife and his 4 dogs are his main support system.    Expected Outcomes Short: attend cardiac rehab for education and exercise. Long: Develop and maintain positive self care habits    Continue Psychosocial Services  Follow up required by staff             Psychosocial Re-Evaluation:  Psychosocial Re-Evaluation     Row Name 08/27/23 1212 09/28/23 7829           Psychosocial Re-Evaluation   Current issues with None Identified None Identified      Comments Continues without any psychosocila concerns Patient reports no issues with their current mental states, sleep, stress, depression or anxiety. Will follow up with patient in a few weeks for any changes.      Expected Outcomes STG  attends all scheduled session exercise and education  LTG maintains without concerns Short: Continue to exercise regularly to support mental health and notify staff of any changes. Long: maintain mental health and well being through teaching of rehab or prescribed medications independently.      Interventions Encouraged to attend Cardiac Rehabilitation for the exercise Encouraged to attend Cardiac Rehabilitation for the exercise      Continue Psychosocial Services  Follow up required by  staff Follow up required by  staff               Psychosocial Discharge (Final Psychosocial Re-Evaluation):  Psychosocial Re-Evaluation - 09/28/23 1610       Psychosocial Re-Evaluation   Current issues with None Identified    Comments Patient reports no issues with their current mental states, sleep, stress, depression or anxiety. Will follow up with patient in a few weeks for any changes.    Expected Outcomes Short: Continue to exercise regularly to support mental health and notify staff of any changes. Long: maintain mental health and well being through teaching of rehab or prescribed medications independently.    Interventions Encouraged to attend Cardiac Rehabilitation for the exercise    Continue Psychosocial Services  Follow up required by staff             Vocational Rehabilitation: Provide vocational rehab assistance to qualifying candidates.   Vocational Rehab Evaluation & Intervention:  Vocational Rehab - 07/28/23 1453       Initial Vocational Rehab Evaluation & Intervention   Assessment shows need for Vocational Rehabilitation No             Education: Education Goals: Education classes will be provided on a variety of topics geared toward better understanding of heart health and risk factor modification. Participant will state understanding/return demonstration of topics presented as noted by education test scores.  Learning Barriers/Preferences:  Learning Barriers/Preferences - 07/28/23 1453       Learning Barriers/Preferences   Learning Barriers None    Learning Preferences None             General Cardiac Education Topics:  AED/CPR: - Group verbal and written instruction with the use of models to demonstrate the basic use of the AED with the basic ABC's of resuscitation.   Anatomy and Cardiac Procedures: - Group verbal and visual presentation and models provide information about basic cardiac anatomy and function. Reviews the testing  methods done to diagnose heart disease and the outcomes of the test results. Describes the treatment choices: Medical Management, Angioplasty, or Coronary Bypass Surgery for treating various heart conditions including Myocardial Infarction, Angina, Valve Disease, and Cardiac Arrhythmias.  Written material given at graduation.   Medication Safety: - Group verbal and visual instruction to review commonly prescribed medications for heart and lung disease. Reviews the medication, class of the drug, and side effects. Includes the steps to properly store meds and maintain the prescription regimen.  Written material given at graduation.   Intimacy: - Group verbal instruction through game format to discuss how heart and lung disease can affect sexual intimacy. Written material given at graduation..   Know Your Numbers and Heart Failure: - Group verbal and visual instruction to discuss disease risk factors for cardiac and pulmonary disease and treatment options.  Reviews associated critical values for Overweight/Obesity, Hypertension, Cholesterol, and Diabetes.  Discusses basics of heart failure: signs/symptoms and treatments.  Introduces Heart Failure Zone chart for action plan for heart failure.  Written material given at graduation. Flowsheet Row Cardiac Rehab from 08/05/2023 in [facility] Cardiac and Pulmonary Rehab  Education need identified 08/05/23       Infection Prevention: - Provides verbal and written material to individual with discussion of infection control including proper hand washing and proper equipment cleaning during exercise session. Flowsheet Row Cardiac Rehab from 08/05/2023 in [facility] Cardiac and Pulmonary Rehab  Date 08/05/23  Educator NT  Instruction Review Code 1- Verbalizes Understanding       Falls Prevention: - Provides verbal  and written material to individual with discussion of falls prevention and safety. Flowsheet Row Cardiac Rehab from 08/05/2023 in [facility] Cardiac and  Pulmonary Rehab  Date 08/05/23  Educator NT  Instruction Review Code 1- Verbalizes Understanding       Other: -Provides group and verbal instruction on various topics (see comments)   Knowledge Questionnaire Score:  Knowledge Questionnaire Score - 08/05/23 1123       Knowledge Questionnaire Score   Pre Score 22/26             Core Components/Risk Factors/Patient Goals at Admission:  Personal Goals and Risk Factors at Admission - 07/28/23 1450       Core Components/Risk Factors/Patient Goals on Admission    Weight Management Yes;Weight Maintenance    Intervention Weight Management: Develop a combined nutrition and exercise program designed to reach desired caloric intake, while maintaining appropriate intake of nutrient and fiber, sodium and fats, and appropriate energy expenditure required for the weight goal.;Weight Management: Provide education and appropriate resources to help participant work on and attain dietary goals.    Expected Outcomes Weight Maintenance: Understanding of the daily nutrition guidelines, which includes 25-35% calories from fat, 7% or less cal from saturated fats, less than 200mg  cholesterol, less than 1.5gm of sodium, & 5 or more servings of fruits and vegetables daily;Understanding of distribution of calorie intake throughout the day with the consumption of 4-5 meals/snacks;Understanding recommendations for meals to include 15-35% energy as protein, 25-35% energy from fat, 35-60% energy from carbohydrates, less than 200mg  of dietary cholesterol, 20-35 gm of total fiber daily    Hypertension Yes    Intervention Provide education on lifestyle modifcations including regular physical activity/exercise, weight management, moderate sodium restriction and increased consumption of fresh fruit, vegetables, and low fat dairy, alcohol moderation, and smoking cessation.;Monitor prescription use compliance.    Expected Outcomes Short Term: Continued assessment and  intervention until BP is < 140/59mm HG in hypertensive participants. < 130/71mm HG in hypertensive participants with diabetes, heart failure or chronic kidney disease.;Long Term: Maintenance of blood pressure at goal levels.    Lipids Yes    Intervention Provide education and support for participant on nutrition & aerobic/resistive exercise along with prescribed medications to achieve LDL 70mg , HDL >40mg .    Expected Outcomes Short Term: Participant states understanding of desired cholesterol values and is compliant with medications prescribed. Participant is following exercise prescription and nutrition guidelines.;Long Term: Cholesterol controlled with medications as prescribed, with individualized exercise RX and with personalized nutrition plan. Value goals: LDL < 70mg , HDL > 40 mg.             Education:Diabetes - Individual verbal and written instruction to review signs/symptoms of diabetes, desired ranges of glucose level fasting, after meals and with exercise. Acknowledge that pre and post exercise glucose checks will be done for 3 sessions at entry of program.   Core Components/Risk Factors/Patient Goals Review:   Goals and Risk Factor Review     Row Name 08/27/23 1213 09/28/23 0938           Core Components/Risk Factors/Patient Goals Review   Personal Goals Review Weight Management/Obesity;Lipids Hypertension      Review Brad Chen has lost weight and wants to gain back to 190lB, he is at 146 lb today. He was required muscle mass for work and is trying to gain this back.  He is compliant with his meds for the lipid control and does monitor intake of fats and is exercising to help with risk  factor control. Brad Chen has been doing well in the program. He has been able to check his blood pressure at home. It has ben within normal ranges like 120s to 130 diastolic. He has no questions avbout his medications at this time. He has been able to increase his work loads in class and will continue  to do so.      Expected Outcomes STG   Continue with exercise prescrotion as tolerated, monitor fat intake for lipid control. maintain compliance with meds  LTG Maintiang healthy lifestyle after discharge Short: increase work loads and check blood pressure at home. Long: maintain BP checks and exercise independently               Core Components/Risk Factors/Patient Goals at Discharge (Final Review):   Goals and Risk Factor Review - 09/28/23 0938       Core Components/Risk Factors/Patient Goals Review   Personal Goals Review Hypertension    Review Brad Chen has been doing well in the program. He has been able to check his blood pressure at home. It has ben within normal ranges like 120s to 130 diastolic. He has no questions avbout his medications at this time. He has been able to increase his work loads in class and will continue to do so.    Expected Outcomes Short: increase work loads and check blood pressure at home. Long: maintain BP checks and exercise independently             ITP Comments:  ITP Comments     Row Name 07/28/23 1501 08/05/23 0909 08/10/23 0929 09/02/23 0959 09/30/23 1307   ITP Comments Initial phone call completed. Diagnosis can be found in [facility] 10/20. EP Orientation scheduled for Thursday 11/14 at 9am. Completed and gym orientation. Initial ITP created and sent for review to Dr. Firman Hughes, Medical Director. First full day of exercise!  Patient was oriented to gym and equipment including functions, settings, policies, and procedures.  Patient's individual exercise prescription and treatment plan were reviewed.  All starting workloads were established based on the results of the 6 minute walk test done at initial orientation visit.  The plan for exercise progression was also introduced and progression will be customized based on patient's performance and goals. 30 Day review completed. Medical Director ITP review done, changes made as directed, and signed approval by  Medical Director.    new to program 30 Day review completed. Medical Director ITP review done, changes made as directed, and signed approval by Medical Director.            Comments:

## 2023-10-02 ENCOUNTER — Encounter: Payer: PRIVATE HEALTH INSURANCE | Admitting: *Deleted

## 2023-10-02 DIAGNOSIS — Z951 Presence of aortocoronary bypass graft: Secondary | ICD-10-CM

## 2023-10-02 NOTE — Progress Notes (Signed)
Daily Session Note  Patient Details  Name: Brad Chen MRN: [id number] Date of Birth: 1961-07-25 Referring Provider:   Flowsheet Row Cardiac Rehab from 08/05/2023 in [facility] Cardiac and Pulmonary Rehab  Referring Provider Dr. Einar Crow, MD       Encounter Date: 10/02/2023  Check In:  Session Check In - 10/02/23 0940       Check-In   Supervising physician immediately available to respond to emergencies See telemetry face sheet for immediately available ER MD    Location [facility]-Cardiac & Pulmonary Rehab    Staff Present Bess Kinds RN,BSN;Joseph [facility] [place], [place], Exercise Physiologist    Virtual Visit No    Medication changes reported     No    Fall or balance concerns reported    No    Tobacco Cessation No Change    Warm-up and Cool-down Performed on first and last piece of equipment    Resistance Training Performed Yes    VAD Patient? No    PAD/SET Patient? No      Pain Assessment   Currently in Pain? No/denies                Social History   Tobacco Use  Smoking Status Former   Average packs/day: 1 pack/day for 33.0 years (33.0 ttl pk-yrs)   Types: Cigarettes   Start date: 1990  Smokeless Tobacco Never    Goals Met:  Independence with exercise equipment Exercise tolerated well No report of concerns or symptoms today Strength training completed today  Goals Unmet:  Not Applicable  Comments: Pt able to follow exercise prescription today without complaint.  Will continue to monitor for progression.    Dr. Bethann Punches is Medical Director for [facility] Cardiac Rehabilitation.  Dr. Vida Rigger is Medical Director for [facility] Pulmonary Rehabilitation.

## 2023-10-05 ENCOUNTER — Encounter: Payer: PRIVATE HEALTH INSURANCE | Admitting: *Deleted

## 2023-10-05 DIAGNOSIS — Z951 Presence of aortocoronary bypass graft: Secondary | ICD-10-CM

## 2023-10-05 NOTE — Progress Notes (Signed)
Daily Session Note  Patient Details  Name: Brad Chen MRN: [id number] Date of Birth: 1960-11-11 Referring Provider:   Flowsheet Row Cardiac Rehab from 08/05/2023 in [facility] Cardiac and Pulmonary Rehab  Referring Provider Dr. Einar Crow, MD       Encounter Date: 10/05/2023  Check In:  Session Check In - 10/05/23 0929       Check-In   Supervising physician immediately available to respond to emergencies See telemetry face sheet for immediately available ER MD    Location [facility]-Cardiac & Pulmonary Rehab    Staff Present Maxon Conetta BS, Exercise Physiologist;Kelly Cloretta Ned, ACSM CEP, Exercise Physiologist;Margaret Best, MS, Exercise Physiologist;Jennesis Ramaswamy Jewel Baize RN,BSN    Virtual Visit No    Medication changes reported     No    Fall or balance concerns reported    No    Warm-up and Cool-down Performed on first and last piece of equipment    Resistance Training Performed Yes    VAD Patient? No    PAD/SET Patient? No      Pain Assessment   Currently in Pain? No/denies                Social History   Tobacco Use  Smoking Status Former   Average packs/day: 1 pack/day for 33.0 years (33.0 ttl pk-yrs)   Types: Cigarettes   Start date: 1990  Smokeless Tobacco Never    Goals Met:  Independence with exercise equipment Exercise tolerated well No report of concerns or symptoms today Strength training completed today  Goals Unmet:  Not Applicable  Comments: Pt able to follow exercise prescription today without complaint.  Will continue to monitor for progression.    Dr. Bethann Punches is Medical Director for [facility] Cardiac Rehabilitation.  Dr. Vida Rigger is Medical Director for [facility] Pulmonary Rehabilitation.

## 2023-10-06 ENCOUNTER — Ambulatory Visit (INDEPENDENT_AMBULATORY_CARE_PROVIDER_SITE_OTHER)

## 2023-10-06 ENCOUNTER — Ambulatory Visit (INDEPENDENT_AMBULATORY_CARE_PROVIDER_SITE_OTHER): Payer: PRIVATE HEALTH INSURANCE | Admitting: Vascular Surgery

## 2023-10-06 DIAGNOSIS — I6523 Occlusion and stenosis of bilateral carotid arteries: Secondary | ICD-10-CM | POA: Diagnosis not present

## 2023-10-06 DIAGNOSIS — I771 Stricture of artery: Secondary | ICD-10-CM | POA: Diagnosis not present

## 2023-10-12 ENCOUNTER — Encounter: Payer: PRIVATE HEALTH INSURANCE | Admitting: *Deleted

## 2023-10-12 DIAGNOSIS — Z951 Presence of aortocoronary bypass graft: Secondary | ICD-10-CM | POA: Diagnosis not present

## 2023-10-12 NOTE — Progress Notes (Signed)
Daily Session Note  Patient Details  Name: Brad Chen MRN: [id number] Date of Birth: [birth year] Referring Provider:   Flowsheet Row Cardiac Rehab from 08/05/2023 in [facility] Cardiac and Pulmonary Rehab  Referring Provider Dr. Einar Crow, MD       Encounter Date: 10/12/2023  Check In:  Session Check In - 10/12/23 0950       Check-In   Supervising physician immediately available to respond to emergencies See telemetry face sheet for immediately available ER MD    Location [facility]-Cardiac & Pulmonary Rehab    Staff Present Rory Percy, MS, Exercise Physiologist;Granvil Djordjevic, RN, BSN, CCRP;Kelly Hayes BS, ACSM CEP, Exercise Physiologist;Maxon Conetta BS, Exercise Physiologist    Virtual Visit No    Medication changes reported     No    Fall or balance concerns reported    No    Warm-up and Cool-down Performed on first and last piece of equipment    Resistance Training Performed Yes    VAD Patient? No    PAD/SET Patient? No      Pain Assessment   Currently in Pain? No/denies                Social History   Tobacco Use  Smoking Status Former   Average packs/day: 1 pack/day for 33.0 years (33.0 ttl pk-yrs)   Types: Cigarettes   Start date: 1990  Smokeless Tobacco Never    Goals Met:  Independence with exercise equipment Exercise tolerated well No report of concerns or symptoms today  Goals Unmet:  Not Applicable  Comments: Pt able to follow exercise prescription today without complaint.  Will continue to monitor for progression.  Reviewed home exercise with pt today.  Pt plans to [place] his TM, recumbent elliptical, and weight machines at home  for exercise.  Reviewed THR, pulse, RPE, sign and symptoms, pulse oximetery and when to call 911 or MD.  Also discussed weather considerations and indoor options.  Pt voiced understanding.   Dr. Bethann Punches is Medical Director for [facility] Cardiac Rehabilitation.  Dr. Vida Rigger is Medical Director for  [facility] Pulmonary Rehabilitation.

## 2023-10-16 ENCOUNTER — Encounter: Payer: PRIVATE HEALTH INSURANCE | Admitting: *Deleted

## 2023-10-16 DIAGNOSIS — Z951 Presence of aortocoronary bypass graft: Secondary | ICD-10-CM

## 2023-10-16 NOTE — Progress Notes (Signed)
Daily Session Note  Patient Details  Name: Brad Chen MRN: [id number] Date of Birth: 02/10/1961 Referring Provider:   Flowsheet Row Cardiac Rehab from 08/05/2023 in [facility] Cardiac and Pulmonary Rehab  Referring Provider Dr. Einar Crow, MD       Encounter Date: 10/16/2023  Check In:  Session Check In - 10/16/23 0940       Check-In   Supervising physician immediately available to respond to emergencies See telemetry face sheet for immediately available ER MD    Location [facility]-Cardiac & Pulmonary Rehab    Staff Present Cora Collum, RN, BSN, CCRP;Noah Tickle, BS, Exercise Physiologist;Joseph Hood RCP,RRT,BSRT    Virtual Visit No    Medication changes reported     No    Fall or balance concerns reported    No    Warm-up and Cool-down Performed on first and last piece of equipment    Resistance Training Performed Yes    VAD Patient? No    PAD/SET Patient? No      Pain Assessment   Currently in Pain? No/denies                Social History   Tobacco Use  Smoking Status Former   Average packs/day: 1 pack/day for 33.0 years (33.0 ttl pk-yrs)   Types: Cigarettes   Start date: 1990  Smokeless Tobacco Never    Goals Met:  Independence with exercise equipment Exercise tolerated well No report of concerns or symptoms today  Goals Unmet:  Not Applicable  Comments: Pt able to follow exercise prescription today without complaint.  Will continue to monitor for progression.    Dr. Bethann Punches is Medical Director for [facility] Cardiac Rehabilitation.  Dr. Vida Rigger is Medical Director for [facility] Pulmonary Rehabilitation.

## 2023-10-26 ENCOUNTER — Encounter: Payer: PRIVATE HEALTH INSURANCE | Attending: Internal Medicine | Admitting: *Deleted

## 2023-10-26 DIAGNOSIS — Z951 Presence of aortocoronary bypass graft: Secondary | ICD-10-CM | POA: Insufficient documentation

## 2023-10-26 DIAGNOSIS — Z48812 Encounter for surgical aftercare following surgery on the circulatory system: Secondary | ICD-10-CM | POA: Diagnosis not present

## 2023-10-26 NOTE — Progress Notes (Signed)
 Daily Session Note  Patient Details  Name: Brad Chen MRN: [id number] Date of Birth: [birth year] Referring Provider:   Flowsheet Row Cardiac Rehab from 08/05/2023 in [facility] Cardiac and Pulmonary Rehab  Referring Provider Dr. Overton Blotter, MD       Encounter Date: 10/26/2023  Check In:  Session Check In - 10/26/23 0944       Check-In   Supervising physician immediately available to respond to emergencies See telemetry face sheet for immediately available ER MD    Location [facility]-Cardiac & Pulmonary Rehab    Staff Present Lyell Samuel, MS, Exercise Physiologist;Kelly Dawne Euler, ACSM CEP, Exercise Physiologist;Karianne Nogueira, RN, BSN, CCRP;Kristen Coble RN,BC,MSN;Maxon [employer], Exercise Physiologist    Virtual Visit No    Medication changes reported     No    Fall or balance concerns reported    No    Warm-up and Cool-down Performed on first and last piece of equipment    Resistance Training Performed Yes    VAD Patient? No    PAD/SET Patient? No      Pain Assessment   Currently in Pain? No/denies                Social History   Tobacco Use  Smoking Status Former   Average packs/day: 1 pack/day for 33.0 years (33.0 ttl pk-yrs)   Types: Cigarettes   Start date: 1990  Smokeless Tobacco Never    Goals Met:  Independence with exercise equipment Exercise tolerated well No report of concerns or symptoms today  Goals Unmet:  Not Applicable  Comments: Pt able to follow exercise prescription today without complaint.  Will continue to monitor for progression.    Dr. Firman Hughes is Medical Director for [facility] Cardiac Rehabilitation.  Dr. Fuad Aleskerov is Medical Director for [facility] Pulmonary Rehabilitation.

## 2023-10-28 ENCOUNTER — Encounter: Payer: PRIVATE HEALTH INSURANCE | Admitting: *Deleted

## 2023-10-28 ENCOUNTER — Encounter: Payer: Self-pay | Admitting: *Deleted

## 2023-10-28 DIAGNOSIS — Z951 Presence of aortocoronary bypass graft: Secondary | ICD-10-CM

## 2023-10-28 DIAGNOSIS — Z48812 Encounter for surgical aftercare following surgery on the circulatory system: Secondary | ICD-10-CM | POA: Diagnosis not present

## 2023-10-28 NOTE — Progress Notes (Signed)
Daily Session Note  Patient Details  Name: SAAD BUHL MRN: [id number] Date of Birth: Nov 22, 1960 Referring Provider:   Flowsheet Row Cardiac Rehab from 08/05/2023 in [facility] Cardiac and Pulmonary Rehab  Referring Provider Dr. Einar Crow, MD       Encounter Date: 10/28/2023  Check In:  Session Check In - 10/28/23 1439       Check-In   Supervising physician immediately available to respond to emergencies See telemetry face sheet for immediately available ER MD    Location [facility]-Cardiac & Pulmonary Rehab    Staff Present Cora Collum, RN, BSN, CCRP;Meredith Jewel Baize RN,BSN;Margaret Best, MS, Exercise Physiologist;Maxon Conetta BS, Exercise Physiologist;Joseph Hood RCP,RRT,BSRT    Virtual Visit No    Medication changes reported     No    Fall or balance concerns reported    No    Warm-up and Cool-down Performed on first and last piece of equipment    Resistance Training Performed Yes    VAD Patient? No    PAD/SET Patient? No      Pain Assessment   Currently in Pain? No/denies                Social History   Tobacco Use  Smoking Status Former   Average packs/day: 1 pack/day for 33.0 years (33.0 ttl pk-yrs)   Types: Cigarettes   Start date: 1990  Smokeless Tobacco Never    Goals Met:  Independence with exercise equipment Exercise tolerated well No report of concerns or symptoms today  Goals Unmet:  Not Applicable  Comments: Pt able to follow exercise prescription today without complaint.  Will continue to monitor for progression.    Dr. Bethann Punches is Medical Director for [facility] Cardiac Rehabilitation.  Dr. Vida Rigger is Medical Director for [facility] Pulmonary Rehabilitation.

## 2023-10-28 NOTE — Progress Notes (Signed)
Cardiac Individual Treatment Plan  Patient Details  Name: Brad Chen MRN: [id number] Date of Birth: 1961/05/13 Referring Provider:   Flowsheet Row Cardiac Rehab from 08/05/2023 in [facility] Cardiac and Pulmonary Rehab  Referring Provider Dr. Einar Crow, MD       Initial Encounter Date:  Flowsheet Row Cardiac Rehab from 08/05/2023 in [facility] Cardiac and Pulmonary Rehab  Date 08/05/23       Visit Diagnosis: S/P CABG x 3  Patient's Home Medications on Admission:  Current Outpatient Medications:    Ascorbic Acid (VITAMIN C PO), Take 1 tablet by mouth See admin instructions. Takes when he remembers, Disp: , Rfl:    aspirin EC 81 MG tablet, Take 1 tablet (81 mg total) by mouth daily. Swallow whole., Disp: 150 tablet, Rfl: 2   atorvastatin (LIPITOR) 10 MG tablet, Take 1 tablet (10 mg total) by mouth daily., Disp: 30 tablet, Rfl: 11   clopidogrel (PLAVIX) 75 MG tablet, Take 1 tablet (75 mg total) by mouth daily., Disp: 30 tablet, Rfl: 11   metoprolol tartrate (LOPRESSOR) 50 MG tablet, Take 50 mg (1 tablet) TWO hours prior to CT scan, Disp: 1 tablet, Rfl: 0   Multiple Vitamins-Minerals (MULTIVITAMIN ADULTS PO), Take 1 tablet by mouth See admin instructions. When he remembers, Disp: , Rfl:    oxyCODONE-acetaminophen (PERCOCET) 5-325 MG tablet, Take 1-2 tablets by mouth every 6 (six) hours as needed for severe pain. 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   a.) s/p partial pharyngectomy, small FOM lesion resection, biodesign placement 11/25/2022 --> pathology (+) for 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); p16 and HPV (-)   Status post carpal tunnel release of both wrists    Subclavian artery stenosis, left (HCC) 01/2023   a.) s/p PTA and stenting 02/13/2023--> tandem proximal LEFT subclavian and LEFT axillary artery stents   TIA (transient ischemic attack) 2018   Torus  mandibularis    a.) s/p excision 11/25/2022    Tobacco Use: Social History   Tobacco Use  Smoking Status Former   Average packs/day: 1 pack/day for 33.0 years (33.0 ttl pk-yrs)   Types: Cigarettes   Start date: 1990  Smokeless Tobacco Never    Labs: Review Flowsheet        No data to display           Exercise Target Goals: Exercise Program Goal: Individual exercise prescription set using results from initial 6 min walk test and THRR while considering  patient's activity barriers and safety.   Exercise Prescription  Goal: Initial exercise prescription builds to 30-45 minutes a day of aerobic activity, 2-3 days per week.  Home exercise guidelines will be given to patient during program as part of exercise prescription that the participant will acknowledge.   Education: Aerobic Exercise: - Group verbal and visual presentation on the components of exercise prescription. Introduces F.I.T.T principle from ACSM for exercise prescriptions.  Reviews F.I.T.T. principles of aerobic exercise including progression. Written material given at graduation. Flowsheet Row Cardiac Rehab from 08/05/2023 in [facility] Cardiac and Pulmonary Rehab  Education need identified 08/05/23       Education: Resistance Exercise: - Group verbal and visual presentation on the components of exercise prescription. Introduces F.I.T.T principle from ACSM for exercise prescriptions  Reviews F.I.T.T. principles of resistance exercise including progression. Written material given at graduation.    Education: Exercise & Equipment Safety: - Individual verbal instruction and demonstration of equipment use and safety with use of the equipment. Flowsheet Row Cardiac Rehab from 08/05/2023 in [facility] Cardiac and Pulmonary Rehab  Date 08/05/23  Educator NT  Instruction Review Code 1- Verbalizes Understanding       Education: Exercise Physiology & General Exercise Guidelines: - Group verbal and written instruction with models to review the exercise physiology of the cardiovascular system and associated critical values. Provides general exercise guidelines with specific guidelines to those with heart or lung disease.    Education: Flexibility, Balance, Mind/Body Relaxation: - Group verbal and visual presentation with interactive activity on the components of exercise prescription. Introduces F.I.T.T principle from ACSM for exercise prescriptions. Reviews F.I.T.T. principles of flexibility and balance exercise training including progression. Also discusses  the mind body connection.  Reviews various relaxation techniques to help reduce and manage stress (i.e. Deep breathing, progressive muscle relaxation, and visualization). Balance handout provided to take home. Written material given at graduation.   Activity Barriers & Risk Stratification:  Activity Barriers & Cardiac Risk Stratification - 08/05/23 0912       Activity Barriers & Cardiac Risk Stratification   Activity Barriers Joint Problems;Other (comment)    Comments Hx Shoulder Surgery, Neuropathy in feet    Cardiac Risk Stratification High             6 Minute Walk:  6 Minute Walk     Row Name 08/05/23 0910         6 Minute Walk   Phase Initial     Distance 1330 feet     Walk Time 6 minutes     # of Rest Breaks 0     MPH 2.52     METS 4.04     RPE 6     Perceived Dyspnea  0     VO2 Peak 14.14     Symptoms No     Resting HR 62 bpm     Resting BP 152/74     Resting Oxygen  Saturation  100 %     Exercise Oxygen Saturation  during 6 min walk 99 %     Max Ex. HR 103 bpm     Max Ex. BP 164/76     2 Minute Post BP 158/76              Oxygen Initial Assessment:   Oxygen Re-Evaluation:  Oxygen Re-Evaluation     Row Name 08/10/23 0930             Goals/Expected Outcomes   Comments Reviewed PLB technique with pt.  Talked about how it works and it's importance in maintaining their exercise saturations.       Goals/Expected Outcomes Short: Become more profiecient at using PLB. Long: Become independent at using PLB.                Oxygen Discharge (Final Oxygen Re-Evaluation):  Oxygen Re-Evaluation - 08/10/23 0930       Goals/Expected Outcomes   Comments Reviewed PLB technique with pt.  Talked about how it works and it's importance in maintaining their exercise saturations.    Goals/Expected Outcomes Short: Become more profiecient at using PLB. Long: Become independent at using PLB.             Initial Exercise Prescription:  Initial Exercise  Prescription - 08/05/23 1100       Date of Initial Exercise RX and Referring Provider   Date 08/05/23    Referring Provider Dr. Einar Crow, MD      Oxygen   Maintain Oxygen Saturation 88% or higher      Treadmill   MPH 2.5    Grade 1    Minutes 15    METs 3.26      NuStep   Level 3   T6 nustep   SPM 80    Minutes 15    METs 4.04      REL-XR   Level 3    Speed 50    Minutes 15    METs 4.04      Prescription Details   Frequency (times per week) 3    Duration Progress to 30 minutes of continuous aerobic without signs/symptoms of physical distress      Intensity   THRR 40-80% of Max Heartrate 100-138    Ratings of Perceived Exertion 11-13    Perceived Dyspnea 0-4      Progression   Progression Continue to progress workloads to maintain intensity without signs/symptoms of physical distress.      Resistance Training   Training Prescription Yes    Weight 2 lb    Reps 10-15             Perform Capillary Blood Glucose checks as needed.  Exercise Prescription Changes:   Exercise Prescription Changes     Row Name 08/05/23 1100 08/25/23 1500 09/11/23 0700 10/06/23 1000 10/12/23 1000     Response to Exercise   Blood Pressure (Admit) 152/74 140/72 128/72 128/68 --   Blood Pressure (Exercise) 164/76 152/68 140/72 122/68 --   Blood Pressure (Exit) 158/76 110/62 122/62 126/64 --   Heart Rate (Admit) 62 bpm 119 bpm 104 bpm 107 bpm --   Heart Rate (Exercise) 103 bpm 137 bpm 131 bpm 135 bpm --   Heart Rate (Exit) 67 bpm 125 bpm 83 bpm 117 bpm --   Oxygen Saturation (Admit) 100 % -- -- -- --   Oxygen Saturation (Exercise) 99 % -- -- -- --   Rating of Perceived  Exertion (Exercise) 6 13 13 13  --   Perceived Dyspnea (Exercise) 0 0 -- 0 --   Symptoms none none none none --   Comments Results -- -- -- --   Duration -- Progress to 30 minutes of  aerobic without signs/symptoms of physical distress Continue with 30 min of aerobic exercise without signs/symptoms  of physical distress. Continue with 30 min of aerobic exercise without signs/symptoms of physical distress. Continue with 30 min of aerobic exercise without signs/symptoms of physical distress.   Intensity -- THRR unchanged THRR unchanged THRR unchanged THRR unchanged     Progression   Progression -- Continue to progress workloads to maintain intensity without signs/symptoms of physical distress. Continue to progress workloads to maintain intensity without signs/symptoms of physical distress. Continue to progress workloads to maintain intensity without signs/symptoms of physical distress. Continue to progress workloads to maintain intensity without signs/symptoms of physical distress.   Average METs -- 3.07 3.21 2.4 2.4     Resistance Training   Training Prescription -- Yes Yes Yes Yes   Weight -- 2 lb 2 lb 2 lb 2 lb   Reps -- 10-15 10-15 10-15 10-15     Interval Training   Interval Training -- No No No No     Treadmill   MPH -- 2.3 2.6 2.3 2.3   Grade -- 0.5 0 0 0   Minutes -- 15 15 15 15    METs -- 2.92 2.99 2.76 2.76     NuStep   Level -- 3  T6 2  T6 nustep -- --   Minutes -- 15 15 -- --   METs -- 2.3 2.84 -- --     REL-XR   Level -- 2 5 7 7    Minutes -- 15 15 15 15    METs -- 3.4 3.5 1.8 1.8     Home Exercise Plan   Plans to continue exercise at -- -- -- -- Home (comment)  TM, Rec Elliptical, and weight machines at home   Frequency -- -- -- -- Add 2 additional days to program exercise sessions.   Initial Home Exercises Provided -- -- -- -- 10/12/23     Oxygen   Maintain Oxygen Saturation -- 88% or higher 88% or higher 88% or higher 88% or higher    Row Name 10/21/23 0800             Response to Exercise   Blood Pressure (Admit) 142/76       Blood Pressure (Exit) 158/74       Heart Rate (Admit) 129 bpm       Heart Rate (Exercise) 150 bpm       Heart Rate (Exit) 101 bpm       Rating of Perceived Exertion (Exercise) 13       Symptoms none       Duration Continue  with 30 min of aerobic exercise without signs/symptoms of physical distress.       Intensity THRR unchanged         Progression   Progression Continue to progress workloads to maintain intensity without signs/symptoms of physical distress.       Average METs 2.64         Resistance Training   Training Prescription Yes       Weight 2 lb       Reps 10-15         Interval Training   Interval Training No  Treadmill   MPH 2.4       Grade 0       Minutes 15       METs 2.84         NuStep   Level 4  T6       Minutes 15       METs 2.2         REL-XR   Level 3       Minutes 15         Home Exercise Plan   Plans to continue exercise at Home (comment)  TM, Rec Elliptical, and weight machines at home       Frequency Add 2 additional days to program exercise sessions.       Initial Home Exercises Provided 10/12/23         Oxygen   Maintain Oxygen Saturation 88% or higher                Exercise Comments:   Exercise Comments     Row Name 08/10/23 9629           Exercise Comments First full day of exercise!  Patient was oriented to gym and equipment including functions, settings, policies, and procedures.  Patient's individual exercise prescription and treatment plan were reviewed.  All starting workloads were established based on the results of the 6 minute walk test done at initial orientation visit.  The plan for exercise progression was also introduced and progression will be customized based on patient's performance and goals.                Exercise Goals and Review:   Exercise Goals     Row Name 08/05/23 0912             Exercise Goals   Increase Physical Activity Yes       Intervention Provide advice, education, support and counseling about physical activity/exercise needs.;Develop an individualized exercise prescription for aerobic and resistive training based on initial evaluation findings, risk stratification, comorbidities and participant's  personal goals.       Expected Outcomes Short Term: Attend rehab on a regular basis to increase amount of physical activity.;Long Term: Add in home exercise to make exercise part of routine and to increase amount of physical activity.;Long Term: Exercising regularly at least 3-5 days a week.       Increase Strength and Stamina Yes       Intervention Provide advice, education, support and counseling about physical activity/exercise needs.;Develop an individualized exercise prescription for aerobic and resistive training based on initial evaluation findings, risk stratification, comorbidities and participant's personal goals.       Expected Outcomes Short Term: Increase workloads from initial exercise prescription for resistance, speed, and METs.;Short Term: Perform resistance training exercises routinely during rehab and add in resistance training at home;Long Term: Improve cardiorespiratory fitness, muscular endurance and strength as measured by increased METs and functional capacity ( )       Able to understand and use rate of perceived exertion (RPE) scale Yes       Intervention Provide education and explanation on how to use RPE scale       Expected Outcomes Short Term: Able to use RPE daily in rehab to express subjective intensity level;Long Term:  Able to use RPE to guide intensity level when exercising independently       Able to understand and use Dyspnea scale Yes       Intervention Provide education and explanation  on how to use Dyspnea scale       Expected Outcomes Short Term: Able to use Dyspnea scale daily in rehab to express subjective sense of shortness of breath during exertion;Long Term: Able to use Dyspnea scale to guide intensity level when exercising independently       Knowledge and understanding of Target Heart Rate Range (THRR) Yes       Intervention Provide education and explanation of THRR including how the numbers were predicted and where they are located for reference        Expected Outcomes Long Term: Able to use THRR to govern intensity when exercising independently;Short Term: Able to state/look up THRR;Short Term: Able to use daily as guideline for intensity in rehab       Able to check pulse independently Yes       Intervention Provide education and demonstration on how to check pulse in carotid and radial arteries.;Review the importance of being able to check your own pulse for safety during independent exercise       Expected Outcomes Short Term: Able to explain why pulse checking is important during independent exercise;Long Term: Able to check pulse independently and accurately       Understanding of Exercise Prescription Yes       Intervention Provide education, explanation, and written materials on patient's individual exercise prescription       Expected Outcomes Short Term: Able to explain program exercise prescription;Long Term: Able to explain home exercise prescription to exercise independently                Exercise Goals Re-Evaluation :  Exercise Goals Re-Evaluation     Row Name 08/10/23 0929 08/25/23 1540 08/27/23 1209 09/11/23 0743 09/24/23 1536     Exercise Goal Re-Evaluation   Exercise Goals Review Able to understand and use rate of perceived exertion (RPE) scale;Able to understand and use Dyspnea scale;Knowledge and understanding of Target Heart Rate Range (THRR);Understanding of Exercise Prescription Increase Physical Activity;Increase Strength and Stamina;Understanding of Exercise Prescription Increase Physical Activity Increase Physical Activity;Increase Strength and Stamina;Understanding of Exercise Prescription Increase Physical Activity;Increase Strength and Stamina;Understanding of Exercise Prescription   Comments Reviewed RPE and dyspnea scale, THR and program prescription with pt today.  Pt voiced understanding and was given a copy of goals to take home. Joan is off to a good start in the program. He continues to get familiar with  his exercise prescription and the modalities involved. He was able to use his prescribed machines at their prescribed intensity and we will continue to monitor his progression in the program. Jabir wants to get back to using his TM and Rec Bike at home. He was using the equipment daily with his wife at least for 30 min.  Advised him to talk with EP about home exercise and any limitation. Tyheim is doing well in rehab. He recently increased his speed on the treadmill to 2.6 mph with no incline. He also improved to level 5 on the XR and continues to work at level 2 on the T6 nustep. We will continue to monitor his progress in the program. Ruhan has not attended rehab during this review. His last visit was 12/27. We will reach out in order to determine his status.   Expected Outcomes Short: Use RPE daily to regulate intensity. Long: Follow program prescription in THR. Short: Continue to follow current exercise prescription. Long: Continue exercise to improve strength and stamina. [facility] Speaks with EP staff about home exercise plan  LTG is exercing at home and CR as prescribed Short: Continue to progressively increase treadmill workload. Long: Continue exercise to improve strength and stamina. Short: Return to rehab. Long: Continue exercise to improve strength and stamina.    Row Name 10/06/23 1012 10/12/23 1001 10/21/23 0816         Exercise Goal Re-Evaluation   Exercise Goals Review Increase Physical Activity;Increase Strength and Stamina;Understanding of Exercise Prescription Increase Physical Activity;Able to understand and use rate of perceived exertion (RPE) scale;Knowledge and understanding of Target Heart Rate Range (THRR);Understanding of Exercise Prescription;Increase Strength and Stamina;Able to understand and use Dyspnea scale;Able to check pulse independently Increase Physical Activity;Understanding of Exercise Prescription;Increase Strength and Stamina     Comments Randel was able to return to rehab  and is doing well. He was recently able to increase his level on the XR from level 5 to level 7. He also was able to use the treadmill but did decrease his speed slightly to 2.46mph. We will continue to monitor his progress in the program. Reviewed home exercise with pt today.  Pt plans to [place] his TM, recumbent elliptical, and weight machines at home  for exercise.  Reviewed THR, pulse, RPE, sign and symptoms, pulse oximetery and when to call 911 or MD.  Also discussed weather considerations and indoor options.  Pt voiced understanding. Roel is doing well in rehab. He was able to increase his speed to 2.4 mph on the treadmill. He also increased to level 4 on the T6 nustep. He decreased to level 3 on the XR, when previously was able to do level 7. We will continue to monitor his progress in the program.     Expected Outcomes Short: Continue to follow exercise prescription. Long: Continue exercise to improve strength and stamina. Shot: add 1-2 days a week of exercise at home on off days of cardiac rehab. Long: maintain independent exercise program upon graduation from cardiac rehab. Short: Try to increase back to level 7 on the XR. Long: Continue exercise to improve strength and stamina.              Discharge Exercise Prescription (Final Exercise Prescription Changes):  Exercise Prescription Changes - 10/21/23 0800       Response to Exercise   Blood Pressure (Admit) 142/76    Blood Pressure (Exit) 158/74    Heart Rate (Admit) 129 bpm    Heart Rate (Exercise) 150 bpm    Heart Rate (Exit) 101 bpm    Rating of Perceived Exertion (Exercise) 13    Symptoms none    Duration Continue with 30 min of aerobic exercise without signs/symptoms of physical distress.    Intensity THRR unchanged      Progression   Progression Continue to progress workloads to maintain intensity without signs/symptoms of physical distress.    Average METs 2.64      Resistance Training   Training Prescription Yes    Weight  2 lb    Reps 10-15      Interval Training   Interval Training No      Treadmill   MPH 2.4    Grade 0    Minutes 15    METs 2.84      NuStep   Level 4   T6   Minutes 15    METs 2.2      REL-XR   Level 3    Minutes 15      Home Exercise Plan   Plans to continue exercise at Home (  comment)   TM, Rec Elliptical, and weight machines at home   Frequency Add 2 additional days to program exercise sessions.    Initial Home Exercises Provided 10/12/23      Oxygen   Maintain Oxygen Saturation 88% or higher             Nutrition:  Target Goals: Understanding of nutrition guidelines, daily intake of sodium 1500mg , cholesterol 200mg , calories 30% from fat and 7% or less from saturated fats, daily to have 5 or more servings of fruits and vegetables.  Education: All About Nutrition: -Group instruction provided by verbal, written material, interactive activities, discussions, models, and posters to present general guidelines for heart healthy nutrition including fat, fiber, MyPlate, the role of sodium in heart healthy nutrition, utilization of the nutrition label, and utilization of this knowledge for meal planning. Follow up email sent as well. Written material given at graduation. Flowsheet Row Cardiac Rehab from 08/05/2023 in [facility] Cardiac and Pulmonary Rehab  Education need identified 08/05/23       Biometrics:  Pre Biometrics - 08/05/23 0913       Pre Biometrics   Height 5' 9.5" (1.765 m)    Weight 146 lb 11.2 oz (66.5 kg)    Waist Circumference 31 inches    Hip Circumference 35 inches    Waist to Hip Ratio 0.89 %    BMI (Calculated) 21.36    Single Leg Stand 8 seconds              Nutrition Therapy Plan and Nutrition Goals:  Nutrition Therapy & Goals - 08/27/23 1211       Nutrition Therapy   RD appointment deferred Yes    Diet deferred meeting with RD 08/26/2023             Nutrition Assessments:  MEDIFICTS Score Key: >=70 Need to make dietary  changes  40-70 Heart Healthy Diet <= 40 Therapeutic Level Cholesterol Diet  Flowsheet Row Cardiac Rehab from 08/05/2023 in [facility] Cardiac and Pulmonary Rehab  Picture Your Plate Total Score on Admission 49      Picture Your Plate Scores: <86 Unhealthy dietary pattern with much room for improvement. 41-50 Dietary pattern unlikely to meet recommendations for good health and room for improvement. 51-60 More healthful dietary pattern, with some room for improvement.  >60 Healthy dietary pattern, although there may be some specific behaviors that could be improved.    Nutrition Goals Re-Evaluation:  Nutrition Goals Re-Evaluation     Row Name 09/28/23 0937             Goals   Comment deferred RD appointment                Nutrition Goals Discharge (Final Nutrition Goals Re-Evaluation):  Nutrition Goals Re-Evaluation - 09/28/23 0937       Goals   Comment deferred RD appointment             Psychosocial: Target Goals: Acknowledge presence or absence of significant depression and/or stress, maximize coping skills, provide positive support system. Participant is able to verbalize types and ability to use techniques and skills needed for reducing stress and depression.   Education: Stress, Anxiety, and Depression - Group verbal and visual presentation to define topics covered.  Reviews how body is impacted by stress, anxiety, and depression.  Also discusses healthy ways to reduce stress and to treat/manage anxiety and depression.  Written material given at graduation.   Education: Sleep Hygiene -Provides group verbal and written  instruction about how sleep can affect your health.  Define sleep hygiene, discuss sleep cycles and impact of sleep habits. Review good sleep hygiene tips.    Initial Review & Psychosocial Screening:  Initial Psych Review & Screening - 07/28/23 1453       Initial Review   Current issues with None Identified      Family Dynamics   Good  Support System? Yes   wife     Barriers   Psychosocial barriers to participate in program There are no identifiable barriers or psychosocial needs.      Screening Interventions   Interventions Encouraged to exercise;Provide feedback about the scores to participant;To provide support and resources with identified psychosocial needs    Expected Outcomes Short Term goal: Utilizing psychosocial counselor, staff and physician to assist with identification of specific Stressors or current issues interfering with healing process. Setting desired goal for each stressor or current issue identified.;Long Term Goal: Stressors or current issues are controlled or eliminated.;Short Term goal: Identification and review with participant of any Quality of Life or Depression concerns found by scoring the questionnaire.;Long Term goal: The participant improves quality of Life and PHQ9 Scores as seen by post scores and/or verbalization of changes             Quality of Life Scores:   Quality of Life - 08/05/23 1124       Quality of Life   Select Quality of Life      Quality of Life Scores   Health/Function Pre 28.33 %    Socioeconomic Pre 30 %    Psych/Spiritual Pre 28.29 %    Family Pre 30 %    GLOBAL Pre 28.91 %            Scores of 19 and below usually indicate a poorer quality of life in these areas.  A difference of  2-3 points is a clinically meaningful difference.  A difference of 2-3 points in the total score of the Quality of Life Index has been associated with significant improvement in overall quality of life, self-image, physical symptoms, and general health in studies assessing change in quality of life.  PHQ-9: Review Flowsheet       08/05/2023  Depression screen PHQ 2/9  Decreased Interest 0  Down, Depressed, Hopeless 0  PHQ - 2 Score 0  Altered sleeping 1  Tired, decreased energy 0  Change in appetite 0  Feeling bad or failure about yourself  0  Trouble concentrating 0   Moving slowly or fidgety/restless 0  Suicidal thoughts 0  PHQ-9 Score 1  Difficult doing work/chores Not difficult at all   Interpretation of Total Score  Total Score Depression Severity:  1-4 = Minimal depression, 5-9 = Mild depression, 10-14 = Moderate depression, 15-19 = Moderately severe depression, 20-27 = Severe depression   Psychosocial Evaluation and Intervention:  Psychosocial Evaluation - 07/28/23 1503       Psychosocial Evaluation & Interventions   Interventions Encouraged to exercise with the program and follow exercise prescription    Comments 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 He states he feels like this surgery has been easier to recover from compared to his previous surgeries. 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 He is retired from [employer]. He has a home gym and up until 6 months ago, was using it consistently. He wants to get back to a regular exercise routine.  He states his wife and his 4 dogs are his main support system.    Expected Outcomes Short: attend cardiac rehab for education and exercise. Long: Develop and maintain positive self care habits    Continue Psychosocial Services  Follow up required by staff             Psychosocial Re-Evaluation:  Psychosocial Re-Evaluation     Row Name 08/27/23 1212 09/28/23 0981           Psychosocial Re-Evaluation   Current issues with None Identified None Identified      Comments Continues without any psychosocila concerns Patient reports no issues with their current mental states, sleep, stress, depression or anxiety. Will follow up with patient in a few weeks for any changes.      Expected Outcomes STG  attends all scheduled session exercise and education  LTG maintains without concerns Short: Continue to exercise regularly to support mental health and notify staff of any changes. Long: maintain mental health and well being through teaching of  rehab or prescribed medications independently.      Interventions Encouraged to attend Cardiac Rehabilitation for the exercise Encouraged to attend Cardiac Rehabilitation for the exercise      Continue Psychosocial Services  Follow up required by staff Follow up required by staff               Psychosocial Discharge (Final Psychosocial Re-Evaluation):  Psychosocial Re-Evaluation - 09/28/23 1914       Psychosocial Re-Evaluation   Current issues with None Identified    Comments Patient reports no issues with their current mental states, sleep, stress, depression or anxiety. Will follow up with patient in a few weeks for any changes.    Expected Outcomes Short: Continue to exercise regularly to support mental health and notify staff of any changes. Long: maintain mental health and well being through teaching of rehab or prescribed medications independently.    Interventions Encouraged to attend Cardiac Rehabilitation for the exercise    Continue Psychosocial Services  Follow up required by staff             Vocational Rehabilitation: Provide vocational rehab assistance to qualifying candidates.   Vocational Rehab Evaluation & Intervention:  Vocational Rehab - 07/28/23 1453       Initial Vocational Rehab Evaluation & Intervention   Assessment shows need for Vocational Rehabilitation No             Education: Education Goals: Education classes will be provided on a variety of topics geared toward better understanding of heart health and risk factor modification. Participant will state understanding/return demonstration of topics presented as noted by education test scores.  Learning Barriers/Preferences:  Learning Barriers/Preferences - 07/28/23 1453       Learning Barriers/Preferences   Learning Barriers None    Learning Preferences None             General Cardiac Education Topics:  AED/CPR: - Group verbal and written instruction with the use of models to  demonstrate the basic use of the AED with the basic ABC's of resuscitation.   Anatomy and Cardiac Procedures: - Group verbal and visual presentation and models provide information about basic cardiac anatomy and function. Reviews the testing methods done to diagnose heart disease and the outcomes of the test results. Describes the treatment choices: Medical Management, Angioplasty, or Coronary Bypass Surgery for treating various heart conditions including Myocardial Infarction, Angina, Valve Disease, and Cardiac Arrhythmias.  Written material given at graduation.  Medication Safety: - Group verbal and visual instruction to review commonly prescribed medications for heart and lung disease. Reviews the medication, class of the drug, and side effects. Includes the steps to properly store meds and maintain the prescription regimen.  Written material given at graduation.   Intimacy: - Group verbal instruction through game format to discuss how heart and lung disease can affect sexual intimacy. Written material given at graduation..   Know Your Numbers and Heart Failure: - Group verbal and visual instruction to discuss disease risk factors for cardiac and pulmonary disease and treatment options.  Reviews associated critical values for Overweight/Obesity, Hypertension, Cholesterol, and Diabetes.  Discusses basics of heart failure: signs/symptoms and treatments.  Introduces Heart Failure Zone chart for action plan for heart failure.  Written material given at graduation. Flowsheet Row Cardiac Rehab from 08/05/2023 in [facility] Cardiac and Pulmonary Rehab  Education need identified 08/05/23       Infection Prevention: - Provides verbal and written material to individual with discussion of infection control including proper hand washing and proper equipment cleaning during exercise session. Flowsheet Row Cardiac Rehab from 08/05/2023 in [facility] Cardiac and Pulmonary Rehab  Date 08/05/23  Educator NT   Instruction Review Code 1- Verbalizes Understanding       Falls Prevention: - Provides verbal and written material to individual with discussion of falls prevention and safety. Flowsheet Row Cardiac Rehab from 08/05/2023 in [facility] Cardiac and Pulmonary Rehab  Date 08/05/23  Educator NT  Instruction Review Code 1- Verbalizes Understanding       Other: -Provides group and verbal instruction on various topics (see comments)   Knowledge Questionnaire Score:  Knowledge Questionnaire Score - 08/05/23 1123       Knowledge Questionnaire Score   Pre Score 22/26             Core Components/Risk Factors/Patient Goals at Admission:  Personal Goals and Risk Factors at Admission - 07/28/23 1450       Core Components/Risk Factors/Patient Goals on Admission    Weight Management Yes;Weight Maintenance    Intervention Weight Management: Develop a combined nutrition and exercise program designed to reach desired caloric intake, while maintaining appropriate intake of nutrient and fiber, sodium and fats, and appropriate energy expenditure required for the weight goal.;Weight Management: Provide education and appropriate resources to help participant work on and attain dietary goals.    Expected Outcomes Weight Maintenance: Understanding of the daily nutrition guidelines, which includes 25-35% calories from fat, 7% or less cal from saturated fats, less than 200mg  cholesterol, less than 1.5gm of sodium, & 5 or more servings of fruits and vegetables daily;Understanding of distribution of calorie intake throughout the day with the consumption of 4-5 meals/snacks;Understanding recommendations for meals to include 15-35% energy as protein, 25-35% energy from fat, 35-60% energy from carbohydrates, less than 200mg  of dietary cholesterol, 20-35 gm of total fiber daily    Hypertension Yes    Intervention Provide education on lifestyle modifcations including regular physical activity/exercise, weight  management, moderate sodium restriction and increased consumption of fresh fruit, vegetables, and low fat dairy, alcohol moderation, and smoking cessation.;Monitor prescription use compliance.    Expected Outcomes Short Term: Continued assessment and intervention until BP is < 140/48mm HG in hypertensive participants. < 130/7mm HG in hypertensive participants with diabetes, heart failure or chronic kidney disease.;Long Term: Maintenance of blood pressure at goal levels.    Lipids Yes    Intervention Provide education and support for participant on nutrition & aerobic/resistive exercise along with  prescribed medications to achieve LDL 70mg , HDL >40mg .    Expected Outcomes Short Term: Participant states understanding of desired cholesterol values and is compliant with medications prescribed. Participant is following exercise prescription and nutrition guidelines.;Long Term: Cholesterol controlled with medications as prescribed, with individualized exercise RX and with personalized nutrition plan. Value goals: LDL < 70mg , HDL > 40 mg.             Education:Diabetes - Individual verbal and written instruction to review signs/symptoms of diabetes, desired ranges of glucose level fasting, after meals and with exercise. Acknowledge that pre and post exercise glucose checks will be done for 3 sessions at entry of program.   Core Components/Risk Factors/Patient Goals Review:   Goals and Risk Factor Review     Row Name 08/27/23 1213 09/28/23 0938           Core Components/Risk Factors/Patient Goals Review   Personal Goals Review Weight Management/Obesity;Lipids Hypertension      Review Harlis has lost weight and wants to gain back to 190lB, he is at 146 lb today. He was required muscle mass for work and is trying to gain this back.  He is compliant with his meds for the lipid control and does monitor intake of fats and is exercising to help with risk factor control. Neils has been doing well in the  program. He has been able to check his blood pressure at home. It has ben within normal ranges like 120s to 130 diastolic. He has no questions avbout his medications at this time. He has been able to increase his work loads in class and will continue to do so.      Expected Outcomes STG   Continue with exercise prescrotion as tolerated, monitor fat intake for lipid control. maintain compliance with meds  LTG Maintiang healthy lifestyle after discharge Short: increase work loads and check blood pressure at home. Long: maintain BP checks and exercise independently               Core Components/Risk Factors/Patient Goals at Discharge (Final Review):   Goals and Risk Factor Review - 09/28/23 0938       Core Components/Risk Factors/Patient Goals Review   Personal Goals Review Hypertension    Review Krzysztof has been doing well in the program. He has been able to check his blood pressure at home. It has ben within normal ranges like 120s to 130 diastolic. He has no questions avbout his medications at this time. He has been able to increase his work loads in class and will continue to do so.    Expected Outcomes Short: increase work loads and check blood pressure at home. Long: maintain BP checks and exercise independently             ITP Comments:  ITP Comments     Row Name 07/28/23 1501 08/05/23 0909 08/10/23 0929 09/02/23 0959 09/30/23 1307   ITP Comments Initial phone call completed. Diagnosis can be found in [facility] 10/20. EP Orientation scheduled for Thursday 11/14 at 9am. Completed and gym orientation. Initial ITP created and sent for review to Dr. Bethann Punches, Medical Director. First full day of exercise!  Patient was oriented to gym and equipment including functions, settings, policies, and procedures.  Patient's individual exercise prescription and treatment plan were reviewed.  All starting workloads were established based on the results of the 6 minute walk test done at initial  orientation visit.  The plan for exercise progression was also introduced and progression  will be customized based on patient's performance and goals. 30 Day review completed. Medical Director ITP review done, changes made as directed, and signed approval by Medical Director.    new to program 30 Day review completed. Medical Director ITP review done, changes made as directed, and signed approval by Medical Director.    Row Name 10/28/23 1124           ITP Comments 30 Day review completed. Medical Director ITP review done, changes made as directed, and signed approval by Medical Director.                Comments:

## 2023-10-30 DIAGNOSIS — Z48812 Encounter for surgical aftercare following surgery on the circulatory system: Secondary | ICD-10-CM | POA: Diagnosis not present

## 2023-10-30 DIAGNOSIS — Z951 Presence of aortocoronary bypass graft: Secondary | ICD-10-CM

## 2023-10-30 NOTE — Progress Notes (Signed)
Daily Session Note  Patient Details  Name: Brad Chen MRN: [id number] Date of Birth: October 19, 1960 Referring Provider:   Flowsheet Row Cardiac Rehab from 08/05/2023 in [facility] Cardiac and Pulmonary Rehab  Referring Provider Dr. Einar Crow, MD       Encounter Date: 10/30/2023  Check In:  Session Check In - 10/30/23 0913       Check-In   Supervising physician immediately available to respond to emergencies See telemetry face sheet for immediately available ER MD    Location [facility]-Cardiac & Pulmonary Rehab    Staff Present Kelton Pillar RN,BSN,MPA;Noah Tickle, BS, Exercise Physiologist;Joseph Hollace Kinnier    Virtual Visit No    Medication changes reported     No    Fall or balance concerns reported    No    Tobacco Cessation No Change    Warm-up and Cool-down Performed on first and last piece of equipment    Resistance Training Performed Yes    VAD Patient? No    PAD/SET Patient? No      Pain Assessment   Currently in Pain? No/denies                Social History   Tobacco Use  Smoking Status Former   Average packs/day: 1 pack/day for 33.0 years (33.0 ttl pk-yrs)   Types: Cigarettes   Start date: 1990  Smokeless Tobacco Never    Goals Met:  Independence with exercise equipment Exercise tolerated well No report of concerns or symptoms today Strength training completed today  Goals Unmet:  Not Applicable  Comments: Pt able to follow exercise prescription today without complaint.  Will continue to monitor for progression.    Dr. Bethann Punches is Medical Director for [facility] Cardiac Rehabilitation.  Dr. Vida Rigger is Medical Director for [facility] Pulmonary Rehabilitation.

## 2023-11-02 ENCOUNTER — Encounter: Payer: PRIVATE HEALTH INSURANCE | Admitting: *Deleted

## 2023-11-02 DIAGNOSIS — Z951 Presence of aortocoronary bypass graft: Secondary | ICD-10-CM

## 2023-11-02 DIAGNOSIS — Z48812 Encounter for surgical aftercare following surgery on the circulatory system: Secondary | ICD-10-CM | POA: Diagnosis not present

## 2023-11-02 NOTE — Progress Notes (Signed)
Daily Session Note  Patient Details  Name: Brad Chen MRN: [id number] Date of Birth: Jan 18, 1961 Referring Provider:   Flowsheet Row Cardiac Rehab from 08/05/2023 in [facility] Cardiac and Pulmonary Rehab  Referring Provider Dr. Einar Crow, MD       Encounter Date: 11/02/2023  Check In:  Session Check In - 11/02/23 0943       Check-In   Supervising physician immediately available to respond to emergencies See telemetry face sheet for immediately available ER MD    Location [facility]-Cardiac & Pulmonary Rehab    Staff Present Cora Collum, RN, BSN, CCRP;Meredith Jewel Baize RN,BSN;Maxon [place] BS, Exercise Physiologist;Jason Wallace Cullens RDN,LDN;[employer]    Virtual Visit No    Medication changes reported     No    Fall or balance concerns reported    No    Warm-up and Cool-down Performed on first and last piece of equipment    Resistance Training Performed Yes    VAD Patient? No    PAD/SET Patient? No      Pain Assessment   Currently in Pain? No/denies                Social History   Tobacco Use  Smoking Status Former   Average packs/day: 1 pack/day for 33.0 years (33.0 ttl pk-yrs)   Types: Cigarettes   Start date: 1990  Smokeless Tobacco Never    Goals Met:  Independence with exercise equipment Exercise tolerated well No report of concerns or symptoms today  Goals Unmet:  Not Applicable  Comments: Pt able to follow exercise prescription today without complaint.  Will continue to monitor for progression.    Dr. Bethann Punches is Medical Director for [facility] Cardiac Rehabilitation.  Dr. Vida Rigger is Medical Director for [facility] Pulmonary Rehabilitation.

## 2023-11-03 ENCOUNTER — Telehealth (INDEPENDENT_AMBULATORY_CARE_PROVIDER_SITE_OTHER): Payer: Self-pay

## 2023-11-03 NOTE — Telephone Encounter (Signed)
Victorino Dike called to see what medications we were providing to Brad Chen because he wasn't sure. This is needed for a medical clearance. Paper will be faxed over for clearance  Palvix- Dew Aspirin- Dew

## 2023-11-09 ENCOUNTER — Encounter: Payer: PRIVATE HEALTH INSURANCE | Attending: *Deleted | Admitting: *Deleted

## 2023-11-09 DIAGNOSIS — Z48812 Encounter for surgical aftercare following surgery on the circulatory system: Secondary | ICD-10-CM | POA: Insufficient documentation

## 2023-11-09 DIAGNOSIS — Z951 Presence of aortocoronary bypass graft: Secondary | ICD-10-CM | POA: Insufficient documentation

## 2023-11-11 ENCOUNTER — Encounter

## 2023-11-16 ENCOUNTER — Encounter: Payer: PRIVATE HEALTH INSURANCE | Attending: Internal Medicine | Admitting: *Deleted

## 2023-11-16 DIAGNOSIS — Z951 Presence of aortocoronary bypass graft: Secondary | ICD-10-CM | POA: Diagnosis present

## 2023-11-16 NOTE — Progress Notes (Signed)
 Daily Session Note  Patient Details  Name: Brad Chen MRN: [id number] Date of Birth: 02-16-1961 Referring Provider:   Flowsheet Row Cardiac Rehab from 08/05/2023 in [facility] Cardiac and Pulmonary Rehab  Referring Provider Dr. Einar Crow, MD       Encounter Date: 11/16/2023  Check In:  Session Check In - 11/16/23 0932       Check-In   Supervising physician immediately available to respond to emergencies See telemetry face sheet for immediately available ER MD    Location [facility]-Cardiac & Pulmonary Rehab    Staff Present Susann Givens RN,BSN;Kelly Madilyn Fireman BS, ACSM CEP, Exercise Physiologist;Jason Wallace Cullens RDN,LDN;Margaret Best, MS, Exercise Physiologist    Virtual Visit No    Medication changes reported     No    Fall or balance concerns reported    No    Warm-up and Cool-down Performed on first and last piece of equipment    Resistance Training Performed Yes    VAD Patient? No    PAD/SET Patient? No      Pain Assessment   Currently in Pain? No/denies                Social History   Tobacco Use  Smoking Status Former   Average packs/day: 1 pack/day for 33.0 years (33.0 ttl pk-yrs)   Types: Cigarettes   Start date: 1990  Smokeless Tobacco Never    Goals Met:  Independence with exercise equipment Exercise tolerated well No report of concerns or symptoms today Strength training completed today  Goals Unmet:  Not Applicable  Comments: Pt able to follow exercise prescription today without complaint.  Will continue to monitor for progression.    Dr. Bethann Punches is Medical Director for [facility] Cardiac Rehabilitation.  Dr. Vida Rigger is Medical Director for [facility] Pulmonary Rehabilitation.

## 2023-11-20 DIAGNOSIS — Z951 Presence of aortocoronary bypass graft: Secondary | ICD-10-CM | POA: Diagnosis not present

## 2023-11-20 NOTE — Progress Notes (Signed)
 Daily Session Note  Patient Details  Name: Brad Chen MRN: [id number] Date of Birth: 11-05-1960 Referring Provider:   Flowsheet Row Cardiac Rehab from 08/05/2023 in [facility] Cardiac and Pulmonary Rehab  Referring Provider Dr. Einar Crow, MD       Encounter Date: 11/20/2023  Check In:  Session Check In - 11/20/23 0910       Check-In   Supervising physician immediately available to respond to emergencies See telemetry face sheet for immediately available ER MD    Location [facility]-Cardiac & Pulmonary Rehab    Staff Present Kelton Pillar RN,BSN,MPA;Noah Tickle, BS, Exercise Physiologist;Joseph Hollace Kinnier    Virtual Visit No    Medication changes reported     No    Fall or balance concerns reported    No    Tobacco Cessation No Change    Warm-up and Cool-down Performed on first and last piece of equipment    Resistance Training Performed Yes    VAD Patient? No    PAD/SET Patient? No      Pain Assessment   Currently in Pain? No/denies                Social History   Tobacco Use  Smoking Status Former   Average packs/day: 1 pack/day for 33.0 years (33.0 ttl pk-yrs)   Types: Cigarettes   Start date: 1990  Smokeless Tobacco Never    Goals Met:  Independence with exercise equipment Exercise tolerated well No report of concerns or symptoms today Strength training completed today  Goals Unmet:  Not Applicable  Comments: Pt able to follow exercise prescription today without complaint.  Will continue to monitor for progression.    Dr. Bethann Punches is Medical Director for [facility] Cardiac Rehabilitation.  Dr. Vida Rigger is Medical Director for [facility] Pulmonary Rehabilitation.

## 2023-11-23 ENCOUNTER — Encounter: Payer: PRIVATE HEALTH INSURANCE | Admitting: *Deleted

## 2023-11-23 DIAGNOSIS — Z951 Presence of aortocoronary bypass graft: Secondary | ICD-10-CM | POA: Diagnosis not present

## 2023-11-23 NOTE — Progress Notes (Signed)
 Daily Session Note  Patient Details  Name: MAXTON NOREEN MRN: [id number] Date of Birth: 1961/03/18 Referring Provider:   Flowsheet Row Cardiac Rehab from 08/05/2023 in [facility] Cardiac and Pulmonary Rehab  Referring Provider Dr. Einar Crow, MD       Encounter Date: 11/23/2023  Check In:  Session Check In - 11/23/23 1012       Check-In   Supervising physician immediately available to respond to emergencies See telemetry face sheet for immediately available ER MD    Location [facility]-Cardiac & Pulmonary Rehab    Staff Present Cora Collum, RN, BSN, CCRP;Margaret Best, MS, Exercise Physiologist;Kelly Cloretta Ned, ACSM CEP, Exercise Physiologist    Virtual Visit No    Medication changes reported     No    Fall or balance concerns reported    No    Warm-up and Cool-down Performed on first and last piece of equipment    Resistance Training Performed Yes    VAD Patient? No    PAD/SET Patient? No      Pain Assessment   Currently in Pain? No/denies                Social History   Tobacco Use  Smoking Status Former   Average packs/day: 1 pack/day for 33.0 years (33.0 ttl pk-yrs)   Types: Cigarettes   Start date: 1990  Smokeless Tobacco Never    Goals Met:  Independence with exercise equipment Exercise tolerated well No report of concerns or symptoms today  Goals Unmet:  Not Applicable  Comments: Pt able to follow exercise prescription today without complaint.  Will continue to monitor for progression.    Dr. Bethann Punches is Medical Director for [facility] Cardiac Rehabilitation.  Dr. Vida Rigger is Medical Director for [facility] Pulmonary Rehabilitation.

## 2023-11-25 DIAGNOSIS — Z951 Presence of aortocoronary bypass graft: Secondary | ICD-10-CM

## 2023-11-25 NOTE — Progress Notes (Signed)
 Cardiac Individual Treatment Plan  Patient Details  Name: Brad Chen MRN: [id number] Date of Birth: 07-Feb-1961 Referring Provider:   Flowsheet Row Cardiac Rehab from 08/05/2023 in [facility] Cardiac and Pulmonary Rehab  Referring Provider Dr. Einar Crow, MD       Initial Encounter Date:  Flowsheet Row Cardiac Rehab from 08/05/2023 in [facility] Cardiac and Pulmonary Rehab  Date 08/05/23       Visit Diagnosis: S/P CABG x 3  Patient's Home Medications on Admission:  Current Outpatient Medications:    Ascorbic Acid (VITAMIN C PO), Take 1 tablet by mouth See admin instructions. Takes when he remembers, Disp: , Rfl:    aspirin EC 81 MG tablet, Take 1 tablet (81 mg total) by mouth daily. Swallow whole., Disp: 150 tablet, Rfl: 2   atorvastatin (LIPITOR) 10 MG tablet, Take 1 tablet (10 mg total) by mouth daily., Disp: 30 tablet, Rfl: 11   clopidogrel (PLAVIX) 75 MG tablet, Take 1 tablet (75 mg total) by mouth daily., Disp: 30 tablet, Rfl: 11   metoprolol tartrate (LOPRESSOR) 50 MG tablet, Take 50 mg (1 tablet) TWO hours prior to CT scan, Disp: 1 tablet, Rfl: 0   Multiple Vitamins-Minerals (MULTIVITAMIN ADULTS PO), Take 1 tablet by mouth See admin instructions. When he remembers, Disp: , Rfl:    oxyCODONE-acetaminophen (PERCOCET) 5-325 MG tablet, Take 1-2 tablets by mouth every 6 (six) hours as needed for severe pain. 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   a.) s/p partial pharyngectomy, small FOM lesion resection, biodesign placement 11/25/2022 --> pathology (+) for 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); p16 and HPV (-)   Status post carpal tunnel release of both wrists    Subclavian artery stenosis, left (HCC) 01/2023   a.) s/p PTA and stenting 02/13/2023--> tandem proximal LEFT subclavian and LEFT axillary artery stents   TIA (transient ischemic attack) 2018   Torus  mandibularis    a.) s/p excision 11/25/2022    Tobacco Use: Social History   Tobacco Use  Smoking Status Former   Average packs/day: 1 pack/day for 33.0 years (33.0 ttl pk-yrs)   Types: Cigarettes   Start date: 1990  Smokeless Tobacco Never    Labs: Review Flowsheet        No data to display           Exercise Target Goals: Exercise Program Goal: Individual exercise prescription set using results from initial 6 min walk test and THRR while considering  patient's activity barriers and safety.   Exercise Prescription  Goal: Initial exercise prescription builds to 30-45 minutes a day of aerobic activity, 2-3 days per week.  Home exercise guidelines will be given to patient during program as part of exercise prescription that the participant will acknowledge.   Education: Aerobic Exercise: - Group verbal and visual presentation on the components of exercise prescription. Introduces F.I.T.T principle from ACSM for exercise prescriptions.  Reviews F.I.T.T. principles of aerobic exercise including progression. Written material given at graduation. Flowsheet Row Cardiac Rehab from 08/05/2023 in [facility] Cardiac and Pulmonary Rehab  Education need identified 08/05/23       Education: Resistance Exercise: - Group verbal and visual presentation on the components of exercise prescription. Introduces F.I.T.T principle from ACSM for exercise prescriptions  Reviews F.I.T.T. principles of resistance exercise including progression. Written material given at graduation.    Education: Exercise & Equipment Safety: - Individual verbal instruction and demonstration of equipment use and safety with use of the equipment. Flowsheet Row Cardiac Rehab from 08/05/2023 in [facility] Cardiac and Pulmonary Rehab  Date 08/05/23  Educator NT  Instruction Review Code 1- Verbalizes Understanding       Education: Exercise Physiology & General Exercise Guidelines: - Group verbal and written instruction with models to review the exercise physiology of the cardiovascular system and associated critical values. Provides general exercise guidelines with specific guidelines to those with heart or lung disease.    Education: Flexibility, Balance, Mind/Body Relaxation: - Group verbal and visual presentation with interactive activity on the components of exercise prescription. Introduces F.I.T.T principle from ACSM for exercise prescriptions. Reviews F.I.T.T. principles of flexibility and balance exercise training including progression. Also discusses  the mind body connection.  Reviews various relaxation techniques to help reduce and manage stress (i.e. Deep breathing, progressive muscle relaxation, and visualization). Balance handout provided to take home. Written material given at graduation.   Activity Barriers & Risk Stratification:  Activity Barriers & Cardiac Risk Stratification - 08/05/23 0912       Activity Barriers & Cardiac Risk Stratification   Activity Barriers Joint Problems;Other (comment)    Comments Hx Shoulder Surgery, Neuropathy in feet    Cardiac Risk Stratification High             6 Minute Walk:  6 Minute Walk     Row Name 08/05/23 0910         6 Minute Walk   Phase Initial     Distance 1330 feet     Walk Time 6 minutes     # of Rest Breaks 0     MPH 2.52     METS 4.04     RPE 6     Perceived Dyspnea  0     VO2 Peak 14.14     Symptoms No     Resting HR 62 bpm     Resting BP 152/74     Resting Oxygen  Saturation  100 %     Exercise Oxygen Saturation  during 6 min walk 99 %     Max Ex. HR 103 bpm     Max Ex. BP 164/76     2 Minute Post BP 158/76              Oxygen Initial Assessment:   Oxygen Re-Evaluation:  Oxygen Re-Evaluation     Row Name 08/10/23 0930             Goals/Expected Outcomes   Comments Reviewed PLB technique with pt.  Talked about how it works and it's importance in maintaining their exercise saturations.       Goals/Expected Outcomes Short: Become more profiecient at using PLB. Long: Become independent at using PLB.                Oxygen Discharge (Final Oxygen Re-Evaluation):  Oxygen Re-Evaluation - 08/10/23 0930       Goals/Expected Outcomes   Comments Reviewed PLB technique with pt.  Talked about how it works and it's importance in maintaining their exercise saturations.    Goals/Expected Outcomes Short: Become more profiecient at using PLB. Long: Become independent at using PLB.             Initial Exercise Prescription:  Initial Exercise  Prescription - 08/05/23 1100       Date of Initial Exercise RX and Referring Provider   Date 08/05/23    Referring Provider Dr. Einar Crow, MD      Oxygen   Maintain Oxygen Saturation 88% or higher      Treadmill   MPH 2.5    Grade 1    Minutes 15    METs 3.26      NuStep   Level 3   T6 nustep   SPM 80    Minutes 15    METs 4.04      REL-XR   Level 3    Speed 50    Minutes 15    METs 4.04      Prescription Details   Frequency (times per week) 3    Duration Progress to 30 minutes of continuous aerobic without signs/symptoms of physical distress      Intensity   THRR 40-80% of Max Heartrate 100-138    Ratings of Perceived Exertion 11-13    Perceived Dyspnea 0-4      Progression   Progression Continue to progress workloads to maintain intensity without signs/symptoms of physical distress.      Resistance Training   Training Prescription Yes    Weight 2 lb    Reps 10-15             Perform Capillary Blood Glucose checks as needed.  Exercise Prescription Changes:   Exercise Prescription Changes     Row Name 08/05/23 1100 08/25/23 1500 09/11/23 0700 10/06/23 1000 10/12/23 1000     Response to Exercise   Blood Pressure (Admit) 152/74 140/72 128/72 128/68 --   Blood Pressure (Exercise) 164/76 152/68 140/72 122/68 --   Blood Pressure (Exit) 158/76 110/62 122/62 126/64 --   Heart Rate (Admit) 62 bpm 119 bpm 104 bpm 107 bpm --   Heart Rate (Exercise) 103 bpm 137 bpm 131 bpm 135 bpm --   Heart Rate (Exit) 67 bpm 125 bpm 83 bpm 117 bpm --   Oxygen Saturation (Admit) 100 % -- -- -- --   Oxygen Saturation (Exercise) 99 % -- -- -- --   Rating of Perceived  Exertion (Exercise) 6 13 13 13  --   Perceived Dyspnea (Exercise) 0 0 -- 0 --   Symptoms none none none none --   Comments Results -- -- -- --   Duration -- Progress to 30 minutes of  aerobic without signs/symptoms of physical distress Continue with 30 min of aerobic exercise without signs/symptoms  of physical distress. Continue with 30 min of aerobic exercise without signs/symptoms of physical distress. Continue with 30 min of aerobic exercise without signs/symptoms of physical distress.   Intensity -- THRR unchanged THRR unchanged THRR unchanged THRR unchanged     Progression   Progression -- Continue to progress workloads to maintain intensity without signs/symptoms of physical distress. Continue to progress workloads to maintain intensity without signs/symptoms of physical distress. Continue to progress workloads to maintain intensity without signs/symptoms of physical distress. Continue to progress workloads to maintain intensity without signs/symptoms of physical distress.   Average METs -- 3.07 3.21 2.4 2.4     Resistance Training   Training Prescription -- Yes Yes Yes Yes   Weight -- 2 lb 2 lb 2 lb 2 lb   Reps -- 10-15 10-15 10-15 10-15     Interval Training   Interval Training -- No No No No     Treadmill   MPH -- 2.3 2.6 2.3 2.3   Grade -- 0.5 0 0 0   Minutes -- 15 15 15 15    METs -- 2.92 2.99 2.76 2.76     NuStep   Level -- 3  T6 2  T6 nustep -- --   Minutes -- 15 15 -- --   METs -- 2.3 2.84 -- --     REL-XR   Level -- 2 5 7 7    Minutes -- 15 15 15 15    METs -- 3.4 3.5 1.8 1.8     Home Exercise Plan   Plans to continue exercise at -- -- -- -- Home (comment)  TM, Rec Elliptical, and weight machines at home   Frequency -- -- -- -- Add 2 additional days to program exercise sessions.   Initial Home Exercises Provided -- -- -- -- 10/12/23     Oxygen   Maintain Oxygen Saturation -- 88% or higher 88% or higher 88% or higher 88% or higher    Row Name 10/21/23 0800 11/06/23 1100 11/20/23 0900         Response to Exercise   Blood Pressure (Admit) 142/76 98/54 126/82     Blood Pressure (Exit) 158/74 104/54 122/68     Heart Rate (Admit) 129 bpm 61 bpm 78 bpm     Heart Rate (Exercise) 150 bpm 144 bpm 103 bpm     Heart Rate (Exit) 101 bpm 67 bpm 62 bpm     Rating  of Perceived Exertion (Exercise) 13 13 13      Symptoms none none none     Duration Continue with 30 min of aerobic exercise without signs/symptoms of physical distress. Continue with 30 min of aerobic exercise without signs/symptoms of physical distress. Continue with 30 min of aerobic exercise without signs/symptoms of physical distress.     Intensity THRR unchanged THRR unchanged THRR unchanged       Progression   Progression Continue to progress workloads to maintain intensity without signs/symptoms of physical distress. Continue to progress workloads to maintain intensity without signs/symptoms of physical distress. Continue to progress workloads to maintain intensity without signs/symptoms of physical distress.     Average METs 2.64 2.62 2.75  Resistance Training   Training Prescription Yes Yes Yes     Weight 2 lb 2 lb 2 lb     Reps 10-15 10-15 10-15       Interval Training   Interval Training No No No       Treadmill   MPH 2.4 2.3 3     Grade 0 0 0     Minutes 15 15 15      METs 2.84 2.76 3.3       NuStep   Level 4  T6 2  T6 3  T6 nustep     Minutes 15 15 15      METs 2.2 2.5 2.2       REL-XR   Level 3 3 --     Minutes 15 15 --       Home Exercise Plan   Plans to continue exercise at Home (comment)  TM, Rec Elliptical, and weight machines at home Home (comment)  TM, Rec Elliptical, and weight machines at home Home (comment)  TM, Rec Elliptical, and weight machines at home     Frequency Add 2 additional days to program exercise sessions. Add 2 additional days to program exercise sessions. Add 2 additional days to program exercise sessions.     Initial Home Exercises Provided 10/12/23 10/12/23 10/12/23       Oxygen   Maintain Oxygen Saturation 88% or higher 88% or higher 88% or higher              Exercise Comments:   Exercise Comments     Row Name 08/10/23 1610           Exercise Comments First full day of exercise!  Patient was oriented to gym and  equipment including functions, settings, policies, and procedures.  Patient's individual exercise prescription and treatment plan were reviewed.  All starting workloads were established based on the results of the 6 minute walk test done at initial orientation visit.  The plan for exercise progression was also introduced and progression will be customized based on patient's performance and goals.                Exercise Goals and Review:   Exercise Goals     Row Name 08/05/23 0912             Exercise Goals   Increase Physical Activity Yes       Intervention Provide advice, education, support and counseling about physical activity/exercise needs.;Develop an individualized exercise prescription for aerobic and resistive training based on initial evaluation findings, risk stratification, comorbidities and participant's personal goals.       Expected Outcomes Short Term: Attend rehab on a regular basis to increase amount of physical activity.;Long Term: Add in home exercise to make exercise part of routine and to increase amount of physical activity.;Long Term: Exercising regularly at least 3-5 days a week.       Increase Strength and Stamina Yes       Intervention Provide advice, education, support and counseling about physical activity/exercise needs.;Develop an individualized exercise prescription for aerobic and resistive training based on initial evaluation findings, risk stratification, comorbidities and participant's personal goals.       Expected Outcomes Short Term: Increase workloads from initial exercise prescription for resistance, speed, and METs.;Short Term: Perform resistance training exercises routinely during rehab and add in resistance training at home;Long Term: Improve cardiorespiratory fitness, muscular endurance and strength as measured by increased METs and functional capacity ( )  Able to understand and use rate of perceived exertion (RPE) scale Yes        Intervention Provide education and explanation on how to use RPE scale       Expected Outcomes Short Term: Able to use RPE daily in rehab to express subjective intensity level;Long Term:  Able to use RPE to guide intensity level when exercising independently       Able to understand and use Dyspnea scale Yes       Intervention Provide education and explanation on how to use Dyspnea scale       Expected Outcomes Short Term: Able to use Dyspnea scale daily in rehab to express subjective sense of shortness of breath during exertion;Long Term: Able to use Dyspnea scale to guide intensity level when exercising independently       Knowledge and understanding of Target Heart Rate Range (THRR) Yes       Intervention Provide education and explanation of THRR including how the numbers were predicted and where they are located for reference       Expected Outcomes Long Term: Able to use THRR to govern intensity when exercising independently;Short Term: Able to state/look up THRR;Short Term: Able to use daily as guideline for intensity in rehab       Able to check pulse independently Yes       Intervention Provide education and demonstration on how to check pulse in carotid and radial arteries.;Review the importance of being able to check your own pulse for safety during independent exercise       Expected Outcomes Short Term: Able to explain why pulse checking is important during independent exercise;Long Term: Able to check pulse independently and accurately       Understanding of Exercise Prescription Yes       Intervention Provide education, explanation, and written materials on patient's individual exercise prescription       Expected Outcomes Short Term: Able to explain program exercise prescription;Long Term: Able to explain home exercise prescription to exercise independently                Exercise Goals Re-Evaluation :  Exercise Goals Re-Evaluation     Row Name 08/10/23 0929 08/25/23 1540  08/27/23 1209 09/11/23 0743 09/24/23 1536     Exercise Goal Re-Evaluation   Exercise Goals Review Able to understand and use rate of perceived exertion (RPE) scale;Able to understand and use Dyspnea scale;Knowledge and understanding of Target Heart Rate Range (THRR);Understanding of Exercise Prescription Increase Physical Activity;Increase Strength and Stamina;Understanding of Exercise Prescription Increase Physical Activity Increase Physical Activity;Increase Strength and Stamina;Understanding of Exercise Prescription Increase Physical Activity;Increase Strength and Stamina;Understanding of Exercise Prescription   Comments Reviewed RPE and dyspnea scale, THR and program prescription with pt today.  Pt voiced understanding and was given a copy of goals to take home. Brad Chen is off to a good start in the program. He continues to get familiar with his exercise prescription and the modalities involved. He was able to use his prescribed machines at their prescribed intensity and we will continue to monitor his progression in the program. Brad Chen wants to get back to using his TM and Rec Bike at home. He was using the equipment daily with his wife at least for 30 min.  Advised him to talk with EP about home exercise and any limitation. Brad Chen is doing well in rehab. He recently increased his speed on the treadmill to 2.6 mph with no incline. He also improved to level 5  on the XR and continues to work at level 2 on the T6 nustep. We will continue to monitor his progress in the program. Brad Chen has not attended rehab during this review. His last visit was 12/27. We will reach out in order to determine his status.   Expected Outcomes Short: Use RPE daily to regulate intensity. Long: Follow program prescription in THR. Short: Continue to follow current exercise prescription. Long: Continue exercise to improve strength and stamina. STG Speaks with EP staff about home exercise plan  LTG is exercing at home and CR as prescribed  Short: Continue to progressively increase treadmill workload. Long: Continue exercise to improve strength and stamina. Short: Return to rehab. Long: Continue exercise to improve strength and stamina.    Row Name 10/06/23 1012 10/12/23 1001 10/21/23 0816 11/06/23 1120 11/20/23 0943     Exercise Goal Re-Evaluation   Exercise Goals Review Increase Physical Activity;Increase Strength and Stamina;Understanding of Exercise Prescription Increase Physical Activity;Able to understand and use rate of perceived exertion (RPE) scale;Knowledge and understanding of Target Heart Rate Range (THRR);Understanding of Exercise Prescription;Increase Strength and Stamina;Able to understand and use Dyspnea scale;Able to check pulse independently Increase Physical Activity;Understanding of Exercise Prescription;Increase Strength and Stamina Increase Physical Activity;Understanding of Exercise Prescription;Increase Strength and Stamina Increase Physical Activity;Understanding of Exercise Prescription;Increase Strength and Stamina   Comments Brad Chen was able to return to rehab and is doing well. He was recently able to increase his level on the XR from level 5 to level 7. He also was able to use the treadmill but did decrease his speed slightly to 2.3mph. We will continue to monitor his progress in the program. Reviewed home exercise with pt today.  Pt plans to [place] his TM, recumbent elliptical, and weight machines at home  for exercise.  Reviewed THR, pulse, RPE, sign and symptoms, pulse oximetery and when to call 911 or MD.  Also discussed weather considerations and indoor options.  Pt voiced understanding. Brad Chen is doing well in rehab. He was able to increase his speed to 2.4 mph on the treadmill. He also increased to level 4 on the T6 nustep. He decreased to level 3 on the XR, when previously was able to do level 7. We will continue to monitor his progress in the program. Brad Chen is doing well in rehab. He continues to do well walking on  the treadmill at a speed of 2.3 mph with no incline. He also saw a decrease from level 4 to level 2 on the T6 nustep and a decrease from level 7 to level 3 on the XR. We will continue to monitor his progress in the program. Brad Chen has only attended one session since the last review. During this session he was able to reach an increased speed on the treadmill of 3 mph with no incline. We will encourage im to attend rehab more regularly and continue to monitor his progress.   Expected Outcomes Short: Continue to follow exercise prescription. Long: Continue exercise to improve strength and stamina. Shot: add 1-2 days a week of exercise at home on off days of cardiac rehab. Long: maintain independent exercise program upon graduation from cardiac rehab. Short: Try to increase back to level 7 on the XR. Long: Continue exercise to improve strength and stamina. Short: Try to increase back to level 7 on the XR and level 4 on the T6 nustep. Long: Continue exercise to improve strength and stamina. Short: Attend rehab more consistently. Long: Continue exercise to improve strength and stamina.  Row Name 11/23/23 0958             Exercise Goal Re-Evaluation   Exercise Goals Review Increase Physical Activity;Increase Strength and Stamina;Understanding of Exercise Prescription       Comments Brad Chen has a treadmill and a exercise bike at home, both he and his wife use them regularly. Here at rehab he is wokring on increasing his workload on treadmill.       Expected Outcomes STG: increase workload here at rehab. LTG Continue to exercise to improve strength and stamina                Discharge Exercise Prescription (Final Exercise Prescription Changes):  Exercise Prescription Changes - 11/20/23 0900       Response to Exercise   Blood Pressure (Admit) 126/82    Blood Pressure (Exit) 122/68    Heart Rate (Admit) 78 bpm    Heart Rate (Exercise) 103 bpm    Heart Rate (Exit) 62 bpm    Rating of Perceived  Exertion (Exercise) 13    Symptoms none    Duration Continue with 30 min of aerobic exercise without signs/symptoms of physical distress.    Intensity THRR unchanged      Progression   Progression Continue to progress workloads to maintain intensity without signs/symptoms of physical distress.    Average METs 2.75      Resistance Training   Training Prescription Yes    Weight 2 lb    Reps 10-15      Interval Training   Interval Training No      Treadmill   MPH 3    Grade 0    Minutes 15    METs 3.3      NuStep   Level 3   T6 nustep   Minutes 15    METs 2.2      Home Exercise Plan   Plans to continue exercise at Home (comment)   TM, Rec Elliptical, and weight machines at home   Frequency Add 2 additional days to program exercise sessions.    Initial Home Exercises Provided 10/12/23      Oxygen   Maintain Oxygen Saturation 88% or higher             Nutrition:  Target Goals: Understanding of nutrition guidelines, daily intake of sodium 1500mg , cholesterol 200mg , calories 30% from fat and 7% or less from saturated fats, daily to have 5 or more servings of fruits and vegetables.  Education: All About Nutrition: -Group instruction provided by verbal, written material, interactive activities, discussions, models, and posters to present general guidelines for heart healthy nutrition including fat, fiber, MyPlate, the role of sodium in heart healthy nutrition, utilization of the nutrition label, and utilization of this knowledge for meal planning. Follow up email sent as well. Written material given at graduation. Flowsheet Row Cardiac Rehab from 08/05/2023 in [facility] Cardiac and Pulmonary Rehab  Education need identified 08/05/23       Biometrics:  Pre Biometrics - 08/05/23 0913       Pre Biometrics   Height 5' 9.5" (1.765 m)    Weight 146 lb 11.2 oz (66.5 kg)    Waist Circumference 31 inches    Hip Circumference 35 inches    Waist to Hip Ratio 0.89 %    BMI  (Calculated) 21.36    Single Leg Stand 8 seconds              Nutrition Therapy Plan and Nutrition Goals:  Nutrition  Therapy & Goals - 08/27/23 1211       Nutrition Therapy   RD appointment deferred Yes    Diet deferred meeting with RD 08/26/2023             Nutrition Assessments:  MEDIFICTS Score Key: >=70 Need to make dietary changes  40-70 Heart Healthy Diet <= 40 Therapeutic Level Cholesterol Diet  Flowsheet Row Cardiac Rehab from 08/05/2023 in [facility] Cardiac and Pulmonary Rehab  Picture Your Plate Total Score on Admission 49      Picture Your Plate Scores: <16 Unhealthy dietary pattern with much room for improvement. 41-50 Dietary pattern unlikely to meet recommendations for good health and room for improvement. 51-60 More healthful dietary pattern, with some room for improvement.  >60 Healthy dietary pattern, although there may be some specific behaviors that could be improved.    Nutrition Goals Re-Evaluation:  Nutrition Goals Re-Evaluation     Row Name 09/28/23 [phone] 11/23/23 0955           Goals   Comment deferred RD appointment Brad Chen reports he and his wife are good eaters, often choosing a lean meat with veggies and sometimes a starch. Discussed balanced plates, sodium limitations and cutting back on saturated fats.      Expected Outcome -- STG: read facts labels and monitor sodium intake. LTG: Maintain a heart healthy diet               Nutrition Goals Discharge (Final Nutrition Goals Re-Evaluation):  Nutrition Goals Re-Evaluation - 11/23/23 0955       Goals   Comment Brad Chen reports he and his wife are good eaters, often choosing a lean meat with veggies and sometimes a starch. Discussed balanced plates, sodium limitations and cutting back on saturated fats.    Expected Outcome STG: read facts labels and monitor sodium intake. LTG: Maintain a heart healthy diet             Psychosocial: Target Goals: Acknowledge presence or absence  of significant depression and/or stress, maximize coping skills, provide positive support system. Participant is able to verbalize types and ability to use techniques and skills needed for reducing stress and depression.   Education: Stress, Anxiety, and Depression - Group verbal and visual presentation to define topics covered.  Reviews how body is impacted by stress, anxiety, and depression.  Also discusses healthy ways to reduce stress and to treat/manage anxiety and depression.  Written material given at graduation.   Education: Sleep Hygiene -Provides group verbal and written instruction about how sleep can affect your health.  Define sleep hygiene, discuss sleep cycles and impact of sleep habits. Review good sleep hygiene tips.    Initial Review & Psychosocial Screening:  Initial Psych Review & Screening - 07/28/23 1453       Initial Review   Current issues with None Identified      Family Dynamics   Good Support System? Yes   wife     Barriers   Psychosocial barriers to participate in program There are no identifiable barriers or psychosocial needs.      Screening Interventions   Interventions Encouraged to exercise;Provide feedback about the scores to participant;To provide support and resources with identified psychosocial needs    Expected Outcomes Short Term goal: Utilizing psychosocial counselor, staff and physician to assist with identification of specific Stressors or current issues interfering with healing process. Setting desired goal for each stressor or current issue identified.;Long Term Goal: Stressors or current issues are controlled or eliminated.;Short  Term goal: Identification and review with participant of any Quality of Life or Depression concerns found by scoring the questionnaire.;Long Term goal: The participant improves quality of Life and PHQ9 Scores as seen by post scores and/or verbalization of changes             Quality of Life Scores:   Quality of  Life - 08/05/23 1124       Quality of Life   Select Quality of Life      Quality of Life Scores   Health/Function Pre 28.33 %    Socioeconomic Pre 30 %    Psych/Spiritual Pre 28.29 %    Family Pre 30 %    GLOBAL Pre 28.91 %            Scores of 19 and below usually indicate a poorer quality of life in these areas.  A difference of  2-3 points is a clinically meaningful difference.  A difference of 2-3 points in the total score of the Quality of Life Index has been associated with significant improvement in overall quality of life, self-image, physical symptoms, and general health in studies assessing change in quality of life.  PHQ-9: Review Flowsheet       08/05/2023  Depression screen PHQ 2/9  Decreased Interest 0  Down, Depressed, Hopeless 0  PHQ - 2 Score 0  Altered sleeping 1  Tired, decreased energy 0  Change in appetite 0  Feeling bad or failure about yourself  0  Trouble concentrating 0  Moving slowly or fidgety/restless 0  Suicidal thoughts 0  PHQ-9 Score 1  Difficult doing work/chores Not difficult at all   Interpretation of Total Score  Total Score Depression Severity:  1-4 = Minimal depression, 5-9 = Mild depression, 10-14 = Moderate depression, 15-19 = Moderately severe depression, 20-27 = Severe depression   Psychosocial Evaluation and Intervention:  Psychosocial Evaluation - 07/28/23 1503       Psychosocial Evaluation & Interventions   Interventions Encouraged to exercise with the program and follow exercise prescription    Comments 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 He states he feels like this surgery has been easier to recover from compared to his previous surgeries. 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 He is retired from [employer]. He has a home gym and up until 6 months ago, was using it consistently. He wants to get back to a regular exercise routine. He states his wife and his  4 dogs are his main support system.    Expected Outcomes Short: attend cardiac rehab for education and exercise. Long: Develop and maintain positive self care habits    Continue Psychosocial Services  Follow up required by staff             Psychosocial Re-Evaluation:  Psychosocial Re-Evaluation     Row Name 08/27/23 1212 09/28/23 1610 11/23/23 0957         Psychosocial Re-Evaluation   Current issues with None Identified None Identified None Identified     Comments Continues without any psychosocila concerns Patient reports no issues with their current mental states, sleep, stress, depression or anxiety. Will follow up with patient in a few weeks for any changes. Patient reports no concerns with stress, anxiety, or depression. He says he is sleeping well. Has a good support system with his wife.     Expected Outcomes STG  attends all scheduled session exercise and  education  LTG maintains without concerns Short: Continue to exercise regularly to support mental health and notify staff of any changes. Long: maintain mental health and well being through teaching of rehab or prescribed medications independently. STG: continue to use support system to stay positive. LTG: maintain a positive outlood on health and daily life     Interventions Encouraged to attend Cardiac Rehabilitation for the exercise Encouraged to attend Cardiac Rehabilitation for the exercise Encouraged to attend Cardiac Rehabilitation for the exercise     Continue Psychosocial Services  Follow up required by staff Follow up required by staff Follow up required by staff              Psychosocial Discharge (Final Psychosocial Re-Evaluation):  Psychosocial Re-Evaluation - 11/23/23 0957       Psychosocial Re-Evaluation   Current issues with None Identified    Comments Patient reports no concerns with stress, anxiety, or depression. He says he is sleeping well. Has a good support system with his wife.    Expected  Outcomes STG: continue to use support system to stay positive. LTG: maintain a positive outlood on health and daily life    Interventions Encouraged to attend Cardiac Rehabilitation for the exercise    Continue Psychosocial Services  Follow up required by staff             Vocational Rehabilitation: Provide vocational rehab assistance to qualifying candidates.   Vocational Rehab Evaluation & Intervention:  Vocational Rehab - 07/28/23 1453       Initial Vocational Rehab Evaluation & Intervention   Assessment shows need for Vocational Rehabilitation No             Education: Education Goals: Education classes will be provided on a variety of topics geared toward better understanding of heart health and risk factor modification. Participant will state understanding/return demonstration of topics presented as noted by education test scores.  Learning Barriers/Preferences:  Learning Barriers/Preferences - 07/28/23 1453       Learning Barriers/Preferences   Learning Barriers None    Learning Preferences None             General Cardiac Education Topics:  AED/CPR: - Group verbal and written instruction with the use of models to demonstrate the basic use of the AED with the basic ABC's of resuscitation.   Anatomy and Cardiac Procedures: - Group verbal and visual presentation and models provide information about basic cardiac anatomy and function. Reviews the testing methods done to diagnose heart disease and the outcomes of the test results. Describes the treatment choices: Medical Management, Angioplasty, or Coronary Bypass Surgery for treating various heart conditions including Myocardial Infarction, Angina, Valve Disease, and Cardiac Arrhythmias.  Written material given at graduation.   Medication Safety: - Group verbal and visual instruction to review commonly prescribed medications for heart and lung disease. Reviews the medication, class of the drug, and side  effects. Includes the steps to properly store meds and maintain the prescription regimen.  Written material given at graduation.   Intimacy: - Group verbal instruction through game format to discuss how heart and lung disease can affect sexual intimacy. Written material given at graduation..   Know Your Numbers and Heart Failure: - Group verbal and visual instruction to discuss disease risk factors for cardiac and pulmonary disease and treatment options.  Reviews associated critical values for Overweight/Obesity, Hypertension, Cholesterol, and Diabetes.  Discusses basics of heart failure: signs/symptoms and treatments.  Introduces Heart Failure Zone chart for action plan for heart  failure.  Written material given at graduation. Flowsheet Row Cardiac Rehab from 08/05/2023 in [facility] Cardiac and Pulmonary Rehab  Education need identified 08/05/23       Infection Prevention: - Provides verbal and written material to individual with discussion of infection control including proper hand washing and proper equipment cleaning during exercise session. Flowsheet Row Cardiac Rehab from 08/05/2023 in [facility] Cardiac and Pulmonary Rehab  Date 08/05/23  Educator NT  Instruction Review Code 1- Verbalizes Understanding       Falls Prevention: - Provides verbal and written material to individual with discussion of falls prevention and safety. Flowsheet Row Cardiac Rehab from 08/05/2023 in [facility] Cardiac and Pulmonary Rehab  Date 08/05/23  Educator NT  Instruction Review Code 1- Verbalizes Understanding       Other: -Provides group and verbal instruction on various topics (see comments)   Knowledge Questionnaire Score:  Knowledge Questionnaire Score - 08/05/23 1123       Knowledge Questionnaire Score   Pre Score 22/26             Core Components/Risk Factors/Patient Goals at Admission:  Personal Goals and Risk Factors at Admission - 07/28/23 1450       Core Components/Risk  Factors/Patient Goals on Admission    Weight Management Yes;Weight Maintenance    Intervention Weight Management: Develop a combined nutrition and exercise program designed to reach desired caloric intake, while maintaining appropriate intake of nutrient and fiber, sodium and fats, and appropriate energy expenditure required for the weight goal.;Weight Management: Provide education and appropriate resources to help participant work on and attain dietary goals.    Expected Outcomes Weight Maintenance: Understanding of the daily nutrition guidelines, which includes 25-35% calories from fat, 7% or less cal from saturated fats, less than 200mg  cholesterol, less than 1.5gm of sodium, & 5 or more servings of fruits and vegetables daily;Understanding of distribution of calorie intake throughout the day with the consumption of 4-5 meals/snacks;Understanding recommendations for meals to include 15-35% energy as protein, 25-35% energy from fat, 35-60% energy from carbohydrates, less than 200mg  of dietary cholesterol, 20-35 gm of total fiber daily    Hypertension Yes    Intervention Provide education on lifestyle modifcations including regular physical activity/exercise, weight management, moderate sodium restriction and increased consumption of fresh fruit, vegetables, and low fat dairy, alcohol moderation, and smoking cessation.;Monitor prescription use compliance.    Expected Outcomes Short Term: Continued assessment and intervention until BP is < 140/4mm HG in hypertensive participants. < 130/29mm HG in hypertensive participants with diabetes, heart failure or chronic kidney disease.;Long Term: Maintenance of blood pressure at goal levels.    Lipids Yes    Intervention Provide education and support for participant on nutrition & aerobic/resistive exercise along with prescribed medications to achieve LDL 70mg , HDL >40mg .    Expected Outcomes Short Term: Participant states understanding of desired cholesterol  values and is compliant with medications prescribed. Participant is following exercise prescription and nutrition guidelines.;Long Term: Cholesterol controlled with medications as prescribed, with individualized exercise RX and with personalized nutrition plan. Value goals: LDL < 70mg , HDL > 40 mg.             Education:Diabetes - Individual verbal and written instruction to review signs/symptoms of diabetes, desired ranges of glucose level fasting, after meals and with exercise. Acknowledge that pre and post exercise glucose checks will be done for 3 sessions at entry of program.   Core Components/Risk Factors/Patient Goals Review:   Goals and Risk Factor Review  Row Name 08/27/23 1213 09/28/23 [id number] 11/23/23 0953         Core Components/Risk Factors/Patient Goals Review   Personal Goals Review Weight Management/Obesity;Lipids Hypertension Hypertension     Review Brad Chen has lost weight and wants to gain back to 190lB, he is at 146 lb today. He was required muscle mass for work and is trying to gain this back.  He is compliant with his meds for the lipid control and does monitor intake of fats and is exercising to help with risk factor control. Brad Chen has been doing well in the program. He has been able to check his blood pressure at home. It has ben within normal ranges like 120s to 130 diastolic. He has no questions avbout his medications at this time. He has been able to increase his work loads in class and will continue to do so. Brad Chen reports he has a blood pressure cuff at home and checks his BP regularly and it has been well controlled within normal levels. Encouraged him to continue to check BP and increase workload here at rehab as able     Expected Outcomes STG   Continue with exercise prescrotion as tolerated, monitor fat intake for lipid control. maintain compliance with meds  LTG Maintiang healthy lifestyle after discharge Short: increase work loads and check blood pressure at  home. Long: maintain BP checks and exercise independently STG: increase workload here at rehab and control BP. LTG: maintain BP checks and exercise independently              Core Components/Risk Factors/Patient Goals at Discharge (Final Review):   Goals and Risk Factor Review - 11/23/23 0953       Core Components/Risk Factors/Patient Goals Review   Personal Goals Review Hypertension    Review Brad Chen reports he has a blood pressure cuff at home and checks his BP regularly and it has been well controlled within normal levels. Encouraged him to continue to check BP and increase workload here at rehab as able    Expected Outcomes STG: increase workload here at rehab and control BP. LTG: maintain BP checks and exercise independently             ITP Comments:  ITP Comments     Row Name 07/28/23 1501 08/05/23 0909 08/10/23 0929 09/02/23 0959 09/30/23 1307   ITP Comments Initial phone call completed. Diagnosis can be found in [facility] 10/20. EP Orientation scheduled for Thursday 11/14 at 9am. Completed and gym orientation. Initial ITP created and sent for review to Dr. Bethann Punches, Medical Director. First full day of exercise!  Patient was oriented to gym and equipment including functions, settings, policies, and procedures.  Patient's individual exercise prescription and treatment plan were reviewed.  All starting workloads were established based on the results of the 6 minute walk test done at initial orientation visit.  The plan for exercise progression was also introduced and progression will be customized based on patient's performance and goals. 30 Day review completed. Medical Director ITP review done, changes made as directed, and signed approval by Medical Director.    new to program 30 Day review completed. Medical Director ITP review done, changes made as directed, and signed approval by Medical Director.    Row Name 10/28/23 1124 11/25/23 1022         ITP Comments 30 Day review  completed. Medical Director ITP review done, changes made as directed, and signed approval by Medical Director. 30 Day review completed. Medical Director ITP  review done, changes made as directed, and signed approval by Medical Director.               Comments: 30 day review

## 2023-11-29 ENCOUNTER — Inpatient Hospital Stay
Admission: EM | Admit: 2023-11-29 | Discharge: 2023-11-30 | DRG: 897 | Disposition: A | Discharge status: Home / Self Care | Attending: Internal Medicine | Admitting: Internal Medicine

## 2023-11-29 ENCOUNTER — Other Ambulatory Visit: Payer: Self-pay

## 2023-11-29 DIAGNOSIS — R002 Palpitations: Secondary | ICD-10-CM | POA: Diagnosis present

## 2023-11-29 DIAGNOSIS — F10229 Alcohol dependence with intoxication, unspecified: Secondary | ICD-10-CM | POA: Diagnosis present

## 2023-11-29 DIAGNOSIS — Z87891 Personal history of nicotine dependence: Secondary | ICD-10-CM | POA: Diagnosis not present

## 2023-11-29 DIAGNOSIS — Z7902 Long term (current) use of antithrombotics/antiplatelets: Secondary | ICD-10-CM

## 2023-11-29 DIAGNOSIS — Z85818 Personal history of malignant neoplasm of other sites of lip, oral cavity, and pharynx: Secondary | ICD-10-CM

## 2023-11-29 DIAGNOSIS — Z951 Presence of aortocoronary bypass graft: Secondary | ICD-10-CM

## 2023-11-29 DIAGNOSIS — Z86718 Personal history of other venous thrombosis and embolism: Secondary | ICD-10-CM | POA: Diagnosis not present

## 2023-11-29 DIAGNOSIS — I700 Atherosclerosis of aorta: Secondary | ICD-10-CM | POA: Diagnosis present

## 2023-11-29 DIAGNOSIS — Y900 Blood alcohol level of less than 20 mg/100 ml: Secondary | ICD-10-CM | POA: Diagnosis present

## 2023-11-29 DIAGNOSIS — Z7982 Long term (current) use of aspirin: Secondary | ICD-10-CM

## 2023-11-29 DIAGNOSIS — I2510 Atherosclerotic heart disease of native coronary artery without angina pectoris: Secondary | ICD-10-CM | POA: Diagnosis present

## 2023-11-29 DIAGNOSIS — M5030 Other cervical disc degeneration, unspecified cervical region: Secondary | ICD-10-CM | POA: Diagnosis present

## 2023-11-29 DIAGNOSIS — Z981 Arthrodesis status: Secondary | ICD-10-CM | POA: Diagnosis not present

## 2023-11-29 DIAGNOSIS — Z8249 Family history of ischemic heart disease and other diseases of the circulatory system: Secondary | ICD-10-CM

## 2023-11-29 DIAGNOSIS — F10231 Alcohol dependence with withdrawal delirium: Secondary | ICD-10-CM | POA: Diagnosis present

## 2023-11-29 DIAGNOSIS — E785 Hyperlipidemia, unspecified: Secondary | ICD-10-CM | POA: Diagnosis present

## 2023-11-29 DIAGNOSIS — I160 Hypertensive urgency: Secondary | ICD-10-CM | POA: Diagnosis present

## 2023-11-29 DIAGNOSIS — Z79899 Other long term (current) drug therapy: Secondary | ICD-10-CM

## 2023-11-29 DIAGNOSIS — Z8673 Personal history of transient ischemic attack (TIA), and cerebral infarction without residual deficits: Secondary | ICD-10-CM

## 2023-11-29 DIAGNOSIS — I10 Essential (primary) hypertension: Secondary | ICD-10-CM | POA: Diagnosis present

## 2023-11-29 DIAGNOSIS — R519 Headache, unspecified: Secondary | ICD-10-CM | POA: Diagnosis present

## 2023-11-29 DIAGNOSIS — F10930 Alcohol use, unspecified with withdrawal, uncomplicated: Principal | ICD-10-CM

## 2023-11-29 LAB — URINE DRUG SCREEN, QUALITATIVE (ARMC ONLY)
Amphetamines, Ur Screen: NOT DETECTED
Barbiturates, Ur Screen: NOT DETECTED
Benzodiazepine, Ur Scrn: NOT DETECTED
Cannabinoid 50 Ng, Ur ~~LOC~~: POSITIVE — AB
Cocaine Metabolite,Ur ~~LOC~~: NOT DETECTED
MDMA (Ecstasy)Ur Screen: NOT DETECTED
Methadone Scn, Ur: NOT DETECTED
Opiate, Ur Screen: NOT DETECTED
Phencyclidine (PCP) Ur S: NOT DETECTED
Tricyclic, Ur Screen: NOT DETECTED

## 2023-11-29 LAB — COMPREHENSIVE METABOLIC PANEL
ALT: 208 U/L — ABNORMAL HIGH (ref 0–44)
AST: 280 U/L — ABNORMAL HIGH (ref 15–41)
Albumin: 4.7 g/dL (ref 3.5–5.0)
Alkaline Phosphatase: 62 U/L (ref 38–126)
Anion gap: 13 (ref 5–15)
BUN: 19 mg/dL (ref 8–23)
CO2: 22 mmol/L (ref 22–32)
Calcium: 9.1 mg/dL (ref 8.9–10.3)
Chloride: 98 mmol/L (ref 98–111)
Creatinine, Ser: 1.18 mg/dL (ref 0.61–1.24)
GFR, Estimated: >60 mL/min (ref >60)
Glucose, Bld: 142 mg/dL — ABNORMAL HIGH (ref 70–99)
Potassium: 3.5 mmol/L (ref 3.5–5.1)
Sodium: 133 mmol/L — ABNORMAL LOW (ref 135–145)
Total Bilirubin: 1.8 mg/dL — ABNORMAL HIGH (ref 0.0–1.2)
Total Protein: 8.2 g/dL — ABNORMAL HIGH (ref 6.5–8.1)

## 2023-11-29 LAB — CBC
HCT: 38 % — ABNORMAL LOW (ref 39.0–52.0)
Hemoglobin: 12.6 g/dL — ABNORMAL LOW (ref 13.0–17.0)
MCH: 26.3 pg (ref 26.0–34.0)
MCHC: 33.2 g/dL (ref 30.0–36.0)
MCV: 79.3 fL — ABNORMAL LOW (ref 80.0–100.0)
Platelets: 288 10*3/uL (ref 150–400)
RBC: 4.79 MIL/uL (ref 4.22–5.81)
RDW: 21.4 % — ABNORMAL HIGH (ref 11.5–15.5)
WBC: 11.5 10*3/uL — ABNORMAL HIGH (ref 4.0–10.5)
nRBC: 0 % (ref 0.0–0.2)

## 2023-11-29 LAB — ACETAMINOPHEN LEVEL: Acetaminophen (Tylenol), Serum: <10 ug/mL — ABNORMAL LOW (ref 10–30)

## 2023-11-29 LAB — SALICYLATE LEVEL: Salicylate Lvl: <7 mg/dL — ABNORMAL LOW (ref 7.0–30.0)

## 2023-11-29 LAB — TROPONIN I (HIGH SENSITIVITY)
Troponin I (High Sensitivity): 30 ng/L — ABNORMAL HIGH (ref <18)
Troponin I (High Sensitivity): 36 ng/L — ABNORMAL HIGH (ref <18)

## 2023-11-29 LAB — ETHANOL: Alcohol, Ethyl (B): <10 mg/dL (ref <10)

## 2023-11-29 MED ORDER — LORAZEPAM 2 MG/ML IJ SOLN
2.0000 mg | Freq: Once | INTRAMUSCULAR | Status: AC
  Administered 2023-11-29: 2 mg via INTRAVENOUS
  Filled 2023-11-29: qty 1

## 2023-11-29 MED ORDER — ONDANSETRON HCL 4 MG PO TABS: 4.0000 mg | ORAL_TABLET | Freq: Four times a day (QID) | ORAL | Status: DC | PRN

## 2023-11-29 MED ORDER — LORAZEPAM 2 MG PO TABS
0.0000 mg | ORAL_TABLET | Freq: Two times a day (BID) | ORAL | Status: DC
Start: 2023-12-02 — End: 2023-12-04

## 2023-11-29 MED ORDER — LORAZEPAM 2 MG/ML IJ SOLN: 0.0000 mg | Freq: Four times a day (QID) | INTRAMUSCULAR | Status: DC

## 2023-11-29 MED ORDER — ENOXAPARIN SODIUM 40 MG/0.4ML IJ SOSY
40.0000 mg | PREFILLED_SYRINGE | INTRAMUSCULAR | Status: DC
  Administered 2023-11-29: 40 mg via SUBCUTANEOUS
  Filled 2023-11-29: qty 0.4

## 2023-11-29 MED ORDER — THIAMINE HCL 100 MG/ML IJ SOLN
100.0000 mg | Freq: Every day | INTRAMUSCULAR | Status: DC
  Administered 2023-11-29: 100 mg via INTRAVENOUS
  Filled 2023-11-29: qty 1
  Filled 2023-11-29: qty 2

## 2023-11-29 MED ORDER — LORAZEPAM 2 MG PO TABS: 0.0000 mg | ORAL_TABLET | Freq: Four times a day (QID) | ORAL | Status: DC

## 2023-11-29 MED ORDER — METOPROLOL TARTRATE 50 MG PO TABS
50.0000 mg | ORAL_TABLET | Freq: Two times a day (BID) | ORAL | Status: DC
  Administered 2023-11-29 – 2023-11-30 (×2): 50 mg via ORAL
  Filled 2023-11-29: qty 2
  Filled 2023-11-29: qty 1

## 2023-11-29 MED ORDER — THIAMINE MONONITRATE 100 MG PO TABS
100.0000 mg | ORAL_TABLET | Freq: Every day | ORAL | Status: DC
  Administered 2023-11-30: 100 mg via ORAL
  Filled 2023-11-29: qty 1

## 2023-11-29 MED ORDER — HYDRALAZINE HCL 20 MG/ML IJ SOLN: 10.0000 mg | Freq: Four times a day (QID) | INTRAMUSCULAR | Status: DC | PRN

## 2023-11-29 MED ORDER — ACETAMINOPHEN 650 MG RE SUPP
650.0000 mg | Freq: Four times a day (QID) | RECTAL | Status: DC | PRN
Start: 2023-11-29 — End: 2023-11-30

## 2023-11-29 MED ORDER — LORAZEPAM 2 MG/ML IJ SOLN: 0.0000 mg | Freq: Two times a day (BID) | INTRAMUSCULAR | Status: DC

## 2023-11-29 MED ORDER — ATORVASTATIN CALCIUM 20 MG PO TABS
10.0000 mg | ORAL_TABLET | Freq: Every day | ORAL | Status: DC
Start: 2023-11-30 — End: 2023-11-30
  Administered 2023-11-30: 10 mg via ORAL
  Filled 2023-11-29: qty 1

## 2023-11-29 MED ORDER — SODIUM CHLORIDE 0.9 % IV BOLUS
1000.0000 mL | Freq: Once | INTRAVENOUS | Status: AC
  Administered 2023-11-29: 1000 mL via INTRAVENOUS

## 2023-11-29 MED ORDER — ONDANSETRON HCL 4 MG/2ML IJ SOLN: 4.0000 mg | Freq: Four times a day (QID) | INTRAMUSCULAR | Status: DC | PRN

## 2023-11-29 MED ORDER — ASPIRIN 81 MG PO TBEC
81.0000 mg | DELAYED_RELEASE_TABLET | Freq: Every day | ORAL | Status: DC
  Administered 2023-11-30: 81 mg via ORAL
  Filled 2023-11-29: qty 1

## 2023-11-29 MED ORDER — ACETAMINOPHEN 325 MG PO TABS: 650.0000 mg | ORAL_TABLET | Freq: Four times a day (QID) | ORAL | Status: DC | PRN

## 2023-11-29 NOTE — ED Notes (Signed)
 Pt given meal tray and drink. Call light in reach.

## 2023-11-29 NOTE — Assessment & Plan Note (Addendum)
 Troponin mildly elevated at 30 but no chest pain and no EKG changes. Continue aspirin and atorvastatin

## 2023-11-29 NOTE — ED Triage Notes (Addendum)
 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 Pt is anxious and restless.  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 He decided to quit cold [place] yesterday and now has a headache and high BP. Pt is agreeable to maybe getting some resources on detox.   Pt wife attempting to do all the speaking, but this RN informed pt this was his decision and I needed to hear from him and what he would like to do today as far as proceeding.

## 2023-11-29 NOTE — ED Provider Notes (Signed)
 [facility] Provider Note    Event Date/Time   First MD Initiated Contact with Patient 11/29/23 1657     (approximate)   History   Alcohol Intoxication (withdrawal)   HPI  Brad Chen is a 63 y.o. male with a history of alcohol abuse, hypertension, hyperlipidemia, and CAD status post CABG who presents with concern for alcohol withdrawal.  The patient states that he drinks daily, some around 4-5 large beers along with vodka.  His last drink was yesterday.  He has gone through withdrawal before with hallucinations and seizures.  He checked his blood pressure and it was over 200 at home.  He had a headache earlier but it is now resolved.  He denies any difficulty breathing.  He has no chest pain.  Denies any hallucinations.  He has had no appetite today but has no vomiting or diarrhea.  I reviewed the past medical records.  His most recent outpatient encounter was in cardiac rehab on 3/12.  He was admitted to vascular surgery in August of last year for a carotid endarterectomy due to atherosclerotic disease.   Physical Exam   Triage Vital Signs: ED Triage Vitals  Encounter Vitals Group     BP 11/29/23 1638 (!) 224/118     Systolic BP Percentile --      Diastolic BP Percentile --      Pulse Rate 11/29/23 1638 (!) 120     Resp 11/29/23 1638 16     Temp 11/29/23 1638 98 F (36.7 C)     Temp Source 11/29/23 1638 Oral     SpO2 11/29/23 1638 98 %     Weight 11/29/23 1639 145 lb 8.1 oz (66 kg)     Height 11/29/23 1639 5\' 9"  (1.753 m)     Head Circumference --      Peak Flow --      Pain Score 11/29/23 1639 0     Pain Loc --      Pain Education --      Exclude from Growth Chart --     Most recent vital signs: Vitals:   11/29/23 1900 11/29/23 1924  BP:  (!) 174/80  Pulse: 81 83  Resp: 20   Temp:    SpO2: 100%      General: Alert, slightly anxious appearing, no distress.  CV:  Good peripheral perfusion.  Resp:  Normal effort.  Abd:  No  distention.  Other:  EOMI.  PERRLA.  Normal speech.  Tongue fasciculation.  No significant asterixis.  Motor intact in all extremities.   ED Results / Procedures / Treatments   Labs (all labs ordered are listed, but only abnormal results are displayed) Labs Reviewed  COMPREHENSIVE METABOLIC PANEL - Abnormal; Notable for the following components:      Result Value   Sodium 133 (*)    Glucose, Bld 142 (*)    Total Protein 8.2 (*)    AST 280 (*)    ALT 208 (*)    Total Bilirubin 1.8 (*)    All other components within normal limits  SALICYLATE LEVEL - Abnormal; Notable for the following components:   Salicylate Lvl <7.0 (*)    All other components within normal limits  ACETAMINOPHEN LEVEL - Abnormal; Notable for the following components:   Acetaminophen (Tylenol), Serum <10 (*)    All other components within normal limits  CBC - Abnormal; Notable for the following components:   WBC 11.5 (*)    Hemoglobin  12.6 (*)    HCT 38.0 (*)    MCV 79.3 (*)    RDW 21.4 (*)    All other components within normal limits  TROPONIN I (HIGH SENSITIVITY) - Abnormal; Notable for the following components:   Troponin I (High Sensitivity) 30 (*)    All other components within normal limits  ETHANOL  URINE DRUG SCREEN, QUALITATIVE ([facility] ONLY)  TROPONIN I (HIGH SENSITIVITY)     EKG  ED ECG REPORT I, Dionne Bucy, the attending physician, personally viewed and interpreted this ECG.  Date: 11/29/2023 EKG Time: 1641 Rate: 114 Rhythm: Sinus tachycardia QRS Axis: normal Intervals: normal ST/T Wave abnormalities: Nonspecific T wave abnormality Narrative Interpretation: no evidence of acute ischemia    RADIOLOGY    PROCEDURES:  Critical Care performed: No  Procedures   MEDICATIONS ORDERED IN ED: Medications  LORazepam (ATIVAN) injection 0-4 mg ( Intravenous Not Given 11/29/23 1734)    Or  LORazepam (ATIVAN) tablet 0-4 mg ( Oral See Alternative 11/29/23 1734)  LORazepam (ATIVAN)  injection 0-4 mg (has no administration in time range)    Or  LORazepam (ATIVAN) tablet 0-4 mg (has no administration in time range)  thiamine (VITAMIN B1) tablet 100 mg ( Oral See Alternative 11/29/23 1725)    Or  thiamine (VITAMIN B1) injection 100 mg (100 mg Intravenous Given 11/29/23 1725)  LORazepam (ATIVAN) injection 2 mg (2 mg Intravenous Given 11/29/23 1725)  sodium chloride 0.9 % bolus 1,000 mL (0 mLs Intravenous Stopped 11/29/23 1926)     IMPRESSION / MDM / ASSESSMENT AND PLAN / ED COURSE  I reviewed the triage vital signs and the nursing notes.  63 year old male with PMH as noted above including extensive cardiac and vascular history presents with withdrawal symptoms after discontinuing alcohol yesterday.  In triage she was significantly hypertensive and tachycardic.  These vital signs have improved slightly.  He has tongue fasciculation but is alert and oriented and does not have asterixis.  Differential diagnosis includes, but is not limited to, alcohol withdrawal.  We will give fluids, Ativan, placed the patient on CIWA protocol, obtain lab workup, and reassess.  Patient's presentation is most consistent with acute presentation with potential threat to life or bodily function.  The patient is on the cardiac monitor to evaluate for evidence of arrhythmia and/or significant heart rate changes.  ----------------------------------------- 7:26 PM on 11/29/2023 -----------------------------------------  Lab workup is significant for slightly elevated troponin likely related to the very high blood pressure earlier.  CMP shows slightly elevated LFTs.  CBC is unremarkable.  The heart rate has improved although the patient is still hypertensive.  He is on the CIWA protocol.  He remains alert and oriented.  He will need inpatient admission for further management.  I consulted Dr. Para March from the hospitalist service; based on our discussion she agrees to evaluate the patient for  admission.  FINAL CLINICAL IMPRESSION(S) / ED DIAGNOSES   Final diagnoses:  Alcohol withdrawal syndrome without complication (HCC)     Rx / DC Orders   ED Discharge Orders     None        Note:  This document was prepared using Dragon voice recognition software and may include unintentional dictation errors.    Dionne Bucy, MD 11/29/23 [phone]

## 2023-11-29 NOTE — ED Notes (Signed)
 This RN received report from Claybon Jabs RN and performed bedside care handoff. This RN introduced self to pt. Call light in reach, bed wheels locked, side rail raised, pt updated on plan of care. Rounding completed.

## 2023-11-29 NOTE — Assessment & Plan Note (Signed)
 CIWA withdrawal protocol TOC consult

## 2023-11-29 NOTE — ED Notes (Signed)
   11/29/23 1924  CIWA-Ar  BP (!) 174/80  Pulse Rate 83  Nausea and Vomiting 0  Tactile Disturbances 0  Tremor 0  Auditory Disturbances 0  Paroxysmal Sweats 0  Visual Disturbances 0  Anxiety 1  Headache, Fullness in Head 0  Agitation 0  Orientation and Clouding of Sensorium 0  CIWA-Ar Total 1   Initial shift assessment

## 2023-11-29 NOTE — H&P (Signed)
 History and Physical    Patient: Brad Chen ZOX:[id number] DOB: [birth year] DOA: 11/29/2023 DOS: the patient was seen and examined on 11/29/2023 PCP: Lauro Regulus, MD  Patient coming from: Home  Chief Complaint:  Chief Complaint  Patient presents with   Alcohol Intoxication    withdrawal    HPI: Brad Chen is a 63 y.o. male with medical history significant for Hypertension, CAD s/p CABG tobacco use disorder, alcohol use disorder (5 beers and vodka daily), who quit drinking cold [place] yesterday who presents to the ED with headache and elevated blood pressure over 200 when he checked it at home.  He has decreased appetite and nausea but no vomiting or diarrhea.  Has palpitations. ED course and data review: EtOH less than 10 Acetaminophen and salicylate undetectable Troponin 30 CBC with WBC 11.5, hemoglobin 12.6. AST/ALT 280/208 with total bilirubin 1.8 EKG showing sinus tachycardia at 114 Patient treated with a fluid bolus, 2 doses of Ativan per CIWA protocol but continued to be tremulous Hospitalist consulted for admission.     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   a.) s/p partial pharyngectomy, small FOM lesion resection, biodesign placement 11/25/2022 --> pathology (+) for 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); p16 and HPV (-)   Status post carpal tunnel release of both wrists    Subclavian artery stenosis, left (HCC) 01/2023   a.) s/p PTA and stenting 02/13/2023--> tandem proximal LEFT subclavian and LEFT axillary artery stents   TIA (transient ischemic attack) 2018   Torus mandibularis    a.) s/p excision 11/25/2022   Past Surgical History:  Procedure Laterality Date   ANTERIOR CERVICAL DECOMP/DISCECTOMY FUSION N/A 2003   Procedure: ANTERIOR CERVICAL DECOMP/DISCECTOMY FUSION (C5-C6)   CARPAL TUNNEL RELEASE Bilateral    ENDARTERECTOMY Right 05/07/2023   Procedure: ENDARTERECTOMY CAROTID;  Surgeon: Annice Needy, MD;  Location: [facility] ORS;  Service: Vascular;  Laterality: Right;   LEFT HEART CATH AND CORONARY ANGIOGRAPHY Left 06/18/2023  Procedure: LEFT HEART CATH AND CORONARY ANGIOGRAPHY;  Surgeon: Alwyn Pea, MD;  Location: [facility] INVASIVE CV LAB;  Service: Cardiovascular;  Laterality: Left;   PHARYNGECTOMY  11/25/2022   Procedure(s): PR PARTIAL REMOVAL OF PHARYNX  PR REMOVAL NODES, NECK,CERV MOD RAD  PR EXCIS MOUTH MUCOSA/SUB,NO REPAIR  LTD PHARYNGECTOMY  CERVICAL LYMPHADENECTOMY (MODIFIED RADICAL NECK DISSECTION)  EXCISION OF LESION OF MUCOSA AND SUBMUCOSA,  VESTIBULE OF MOUTH; WITHOUT REPAIR   SHOULDER ARTHROSCOPY WITH SUBACROMIAL DECOMPRESSION AND OPEN ROTATOR C Left 07/22/2022   Procedure: Left shoulder arthroscopic subscapularis repair, mini open rotator cuff repair vs reconstruction with allograft, subacromial decompression, distal clavicle excision, and biceps tenodesis;  Surgeon: Signa Kell, MD;  Location: [facility] ORS;  Service: Orthopedics;  Laterality: Left;   UPPER EXTREMITY ANGIOGRAPHY Left 02/13/2023   Procedure: Upper Extremity Angiography;  Surgeon: Annice Needy, MD;  Location: [facility] INVASIVE CV LAB;  Service: Cardiovascular;  Laterality: Left;   Social History:  reports that he has quit smoking. His smoking use included cigarettes. He started smoking about 35 years ago. He has a 33 pack-year smoking history. He has never used smokeless tobacco. He reports that he does not currently use alcohol. He reports that he does not use drugs.  No Known Allergies  Family History  Problem Relation Age of Onset   Hypertension Mother     Prior to Admission medications   Medication Sig Start Date End Date Taking? Authorizing Provider  Ascorbic Acid (VITAMIN C PO) Take 1 tablet by mouth See admin instructions. Takes when he remembers    [provider]  aspirin EC 81 MG tablet Take 1 tablet (81 mg total) by mouth daily. Swallow whole. 02/13/23 02/13/24  Annice Needy, MD  atorvastatin (LIPITOR) 10 MG tablet Take 1 tablet (10 mg total) by mouth daily. 02/13/23 02/13/24  Annice Needy, MD  clopidogrel (PLAVIX) 75 MG tablet Take 1 tablet (75 mg total) by mouth daily. 02/13/23   Annice Needy, MD  metoprolol tartrate (LOPRESSOR) 50 MG tablet Take 50 mg (1 tablet) TWO hours prior to CT scan 06/05/23   Debbe Odea, MD  Multiple Vitamins-Minerals (MULTIVITAMIN ADULTS PO) Take 1 tablet by mouth See admin instructions. When he remembers    [provider]  oxyCODONE-acetaminophen (PERCOCET) 5-325 MG tablet Take 1-2 tablets by mouth every 6  (six) hours as needed for severe pain. Patient not taking: Reported on 06/04/2023 05/08/23   Georgiana Spinner, NP    Physical Exam: Vitals:   11/29/23 1730 11/29/23 1733 11/29/23 1900 11/29/23 1924  BP: (!) 144/87 (!) 144/87  (!) 174/80  Pulse: 94 94 81 83  Resp: 15  20   Temp:      TempSrc:      SpO2: 100%  100%   Weight:      Height:       Physical Exam Vitals and nursing note reviewed.  Constitutional:      General: He is not in acute distress. HENT:     Head: Normocephalic and atraumatic.  Cardiovascular:     Rate and Rhythm: Normal rate and regular rhythm.     Heart sounds: Normal heart sounds.  Pulmonary:     Effort: Pulmonary effort is normal.     Breath sounds: Normal breath sounds.  Abdominal:     Palpations: Abdomen is soft.     Tenderness: There is no abdominal tenderness.  Neurological:     Mental Status: Mental status is at baseline.  Labs on Admission: I have personally reviewed following labs and imaging studies  CBC: Recent Labs  Lab 11/29/23 1644  WBC 11.5*  HGB 12.6*  HCT 38.0*  MCV 79.3*  PLT 288   Basic Metabolic Panel: Recent Labs  Lab 11/29/23 1644  NA 133*  K 3.5  CL 98  CO2 22  GLUCOSE 142*  BUN 19  CREATININE 1.18  CALCIUM 9.1   GFR: Estimated Creatinine Clearance: 59.8 mL/min (by C-G formula based on SCr of 1.18 mg/dL). Liver Function Tests: Recent Labs  Lab 11/29/23 1644  AST 280*  ALT 208*  ALKPHOS 62  BILITOT 1.8*  PROT 8.2*  ALBUMIN 4.7   No results for input(s): "LIPASE", "AMYLASE" in the last 168 hours. No results for input(s): "AMMONIA" in the last 168 hours. Coagulation Profile: No results for input(s): "INR", "PROTIME" in the last 168 hours. Cardiac Enzymes: No results for input(s): "CKTOTAL", "CKMB", "CKMBINDEX", "TROPONINI" in the last 168 hours. BNP (last 3 results) No results for input(s): "PROBNP" in the last 8760 hours. HbA1C: No results for input(s): "HGBA1C" in the last 72 hours. CBG: No  results for input(s): "GLUCAP" in the last 168 hours. Lipid Profile: No results for input(s): "CHOL", "HDL", "LDLCALC", "TRIG", "CHOLHDL", "LDLDIRECT" in the last 72 hours. Thyroid Function Tests: No results for input(s): "TSH", "T4TOTAL", "FREET4", "T3FREE", "THYROIDAB" in the last 72 hours. Anemia Panel: No results for input(s): "VITAMINB12", "FOLATE", "FERRITIN", "TIBC", "IRON", "RETICCTPCT" in the last 72 hours. Urine analysis:    Component Value Date/Time   COLORURINE YELLOW (A) 02/19/2020 1510   APPEARANCEUR CLEAR (A) 02/19/2020 1510   LABSPEC 1.006 02/19/2020 1510   PHURINE 6.0 02/19/2020 1510   GLUCOSEU 50 (A) 02/19/2020 1510   HGBUR NEGATIVE 02/19/2020 1510   BILIRUBINUR NEGATIVE 02/19/2020 1510   KETONESUR 5 (A) 02/19/2020 1510   PROTEINUR NEGATIVE 02/19/2020 1510   NITRITE NEGATIVE 02/19/2020 1510   LEUKOCYTESUR NEGATIVE 02/19/2020 1510    Radiological Exams on Admission: No results found.   Data Reviewed: Relevant notes from primary care and specialist visits, past discharge summaries as available in EHR, including Care Everywhere. Prior diagnostic testing as pertinent to current admission diagnoses Updated medications and problem lists for reconciliation ED course, including vitals, labs, imaging, treatment and response to treatment Triage notes, nursing and pharmacy notes and ED provider's notes Notable results as noted in HPI   Assessment and Plan: * Acute, mixed level of activity, alcohol withdrawal delirium (HCC) CIWA withdrawal protocol TOC consult  Hypertensive urgency In part secondary to alcohol withdrawal Continue home metoprolol Hydralazine as needed for blood pressure  S/P coronary artery bypass graft x 3 Troponin mildly elevated at 30 but no chest pain and no EKG changes. Continue aspirin and atorvastatin      DVT prophylaxis: Lovenox  Consults: none  Advance Care Planning:   Code Status: Prior   Family Communication:  none  Disposition Plan: Back to previous home environment  Severity of Illness: The appropriate patient status for this patient is OBSERVATION. Observation status is judged to be reasonable and necessary in order to provide the required intensity of service to ensure the patient's safety. The patient's presenting symptoms, physical exam findings, and initial radiographic and laboratory data in the context of their medical condition is felt to place them at decreased risk for further clinical deterioration. Furthermore, it is anticipated that the patient will be medically stable for discharge from the hospital within 2 midnights of admission.   Author: Andris Baumann, MD 11/29/2023  7:31 PM  For on call review [email or web address].

## 2023-11-29 NOTE — Assessment & Plan Note (Signed)
 In part secondary to alcohol withdrawal Continue home metoprolol Hydralazine as needed for blood pressure

## 2023-11-29 NOTE — ED Notes (Signed)
 This RN attempted IV but unsuccessful.

## 2023-11-30 ENCOUNTER — Other Ambulatory Visit: Payer: Self-pay

## 2023-11-30 ENCOUNTER — Encounter: Payer: Self-pay | Admitting: Internal Medicine

## 2023-11-30 DIAGNOSIS — I160 Hypertensive urgency: Secondary | ICD-10-CM

## 2023-11-30 DIAGNOSIS — F10930 Alcohol use, unspecified with withdrawal, uncomplicated: Principal | ICD-10-CM

## 2023-11-30 DIAGNOSIS — Z951 Presence of aortocoronary bypass graft: Secondary | ICD-10-CM

## 2023-11-30 LAB — HIV ANTIBODY (ROUTINE TESTING W REFLEX): HIV Screen 4th Generation wRfx: NONREACTIVE

## 2023-11-30 LAB — TROPONIN I (HIGH SENSITIVITY): Troponin I (High Sensitivity): 15 ng/L (ref <18)

## 2023-11-30 MED ORDER — METOPROLOL TARTRATE 50 MG PO TABS
50.0000 mg | ORAL_TABLET | Freq: Two times a day (BID) | ORAL | 0 refills | Status: DC
  Filled 2023-11-30: qty 180, 90d supply, fill #0

## 2023-11-30 MED ORDER — ATORVASTATIN CALCIUM 10 MG PO TABS
10.0000 mg | ORAL_TABLET | Freq: Every day | ORAL | 0 refills | Status: DC
Start: 2023-11-30 — End: 2024-04-12
  Filled 2023-11-30: qty 90, 90d supply, fill #0

## 2023-11-30 MED ORDER — FOLIC ACID 400 MCG PO TABS
400.0000 ug | ORAL_TABLET | Freq: Every day | ORAL | 0 refills | Status: AC
  Filled 2023-11-30: qty 30, 30d supply, fill #0

## 2023-11-30 MED ORDER — VITAMIN B-1 100 MG PO TABS
100.0000 mg | ORAL_TABLET | Freq: Every day | ORAL | 0 refills | Status: AC
  Filled 2023-11-30: qty 5, 5d supply, fill #0

## 2023-11-30 MED ORDER — PANTOPRAZOLE SODIUM 40 MG PO TBEC
40.0000 mg | DELAYED_RELEASE_TABLET | Freq: Every day | ORAL | 0 refills | Status: DC
  Filled 2023-11-30: qty 90, 90d supply, fill #0

## 2023-11-30 MED ORDER — ASPIRIN 81 MG PO TBEC
81.0000 mg | DELAYED_RELEASE_TABLET | Freq: Every day | ORAL | 0 refills | Status: AC
  Filled 2023-11-30: qty 30, 30d supply, fill #0

## 2023-11-30 MED ORDER — NITROGLYCERIN 0.4 MG SL SUBL
0.4000 mg | SUBLINGUAL_TABLET | SUBLINGUAL | 0 refills | Status: DC | PRN
  Filled 2023-11-30: qty 25, 8d supply, fill #0

## 2023-11-30 NOTE — TOC Transition Note (Signed)
 Transition of Care [facility]) - Discharge Note   Patient Details  Name: Brad Chen MRN: [id number] Date of Birth: 1960-10-13  [facility]) CM/SW Contact:  Brad Broach, LCSW Phone Number: 11/30/2023, 2:17 PM   Clinical Narrative:    CSW met with patient and completed assessment.  Insurance and demographic information verified.  Patient amenable to answering CSW questions for d/c planning.  Patient's PCP is Dr. Dareen Chen and he uses [facility].  He has no concerns about getting his medications.  Patient lives with his wife, Brad Chen [phone]  He states that he doesn't have any DME at home.  At discharge, patient's wife will transport him home,  CSW received consult by Epic chat for SUD resources.  Patient amenable and resources placed in AVS.  No further TOC needs.  CSW signing off.     Barriers to Discharge: No Barriers Identified   Patient Goals and CMS Choice            Discharge Placement                       Discharge Plan and Services Additional resources added to the After Visit Summary for                                       Social Drivers of Health (SDOH) Interventions SDOH Screenings   Food Insecurity: No Food Insecurity (11/30/2023)  Housing: Low Risk  (11/30/2023)  Transportation Needs: No Transportation Needs (11/30/2023)  Utilities: Not At Risk (11/30/2023)  Depression (PHQ2-9): Low Risk  (08/05/2023)  Social Connections: Moderately Integrated (11/30/2023)  Tobacco Use: Medium Risk (11/30/2023)     Readmission Risk Interventions     No data to display

## 2023-11-30 NOTE — ED Notes (Signed)
 This RN gave report to Katheran James and via phone call.

## 2023-11-30 NOTE — TOC CM/SW Note (Signed)
 CSW placed Substance Use resources in patient AVS per request from doctor.

## 2023-11-30 NOTE — Discharge Instructions (Signed)

## 2023-11-30 NOTE — Discharge Summary (Signed)
 Physician Discharge Summary   Patient: Brad Chen MRN: [id number] DOB: 1961/01/10  Admit date:     11/29/2023  Discharge date: 11/30/23  Discharge Physician: Delfino Lovett   PCP: Lauro Regulus, MD   Recommendations at discharge:    F/up with outpt providers as requested Consider AA  Discharge Diagnoses: Principal Problem:   Acute, mixed level of activity, alcohol withdrawal delirium (HCC) Active Problems:   Hypertensive urgency   S/P coronary artery bypass graft x 3   Alcohol withdrawal syndrome without complication (HCC)  Resolved Problems:   * No resolved hospital problems. *  Hospital Course: Assessment and Plan:  63 y.o. male with medical history significant for Hypertension, CAD s/p CABG tobacco use disorder, alcohol use disorder (5 beers and vodka daily), who quit drinking cold [place] yesterday who presents to the ED with headache and elevated blood pressure over 200 when he checked it at home.   * Acute, mixed level of activity, alcohol withdrawal delirium (HCC) Didn't have any withdrawal symptoms and he's demanding discharge home. I d/w his wife and she is in agreement. I highly recommend AA - resources given by [facility]   Hypertensive urgency Now resolved.   S/P coronary artery bypass graft x 3 Troponin mildly elevated at 30 but no chest pain and no EKG changes. Continue aspirin and atorvastatin         Disposition: Home Diet recommendation:  Discharge Diet Orders (From admission, onward)     Start     Ordered   11/30/23 0000  Diet - low sodium heart healthy        11/30/23 1353           Carb modified diet DISCHARGE MEDICATION: Allergies as of 11/30/2023   No Known Allergies      Medication List     STOP taking these medications    clopidogrel 75 MG tablet Commonly known as: Plavix   doxycycline 100 MG capsule Commonly known as: VIBRAMYCIN   furosemide 20 MG tablet Commonly known as: LASIX   Klor-Con M20 20 MEQ  tablet Generic drug: potassium chloride SA   methocarbamol 500 MG tablet Commonly known as: ROBAXIN   MULTIVITAMIN ADULTS PO   oxyCODONE 5 MG immediate release tablet Commonly known as: Oxy IR/ROXICODONE   oxyCODONE-acetaminophen 5-325 MG tablet Commonly known as: Percocet   VITAMIN C PO       TAKE these medications    aspirin EC 81 MG tablet Take 1 tablet (81 mg total) by mouth daily. Swallow whole.   atorvastatin 10 MG tablet Commonly known as: Lipitor Take 1 tablet (10 mg total) by mouth daily.   folic acid 400 MCG tablet Commonly known as: FOLVITE Take 1 tablet (400 mcg total) by mouth daily.   metoprolol tartrate 50 MG tablet Commonly known as: LOPRESSOR Take 1 tablet (50 mg total) by mouth 2 (two) times daily. What changed:  how much to take how to take this when to take this additional instructions   nitroGLYCERIN 0.4 MG SL tablet Commonly known as: NITROSTAT Place 1 tablet (0.4 mg total) under the tongue every 5 (five) minutes as needed.   pantoprazole 40 MG tablet Commonly known as: PROTONIX Take 1 tablet (40 mg total) by mouth daily.   thiamine 100 MG tablet Commonly known as: VITAMIN B1 Take 1 tablet (100 mg total) by mouth daily for 5 days. Start taking on: December 01, 2023        Follow-up Information     Dareen Piano,  Marya Amsler, MD. Schedule an appointment as soon as possible for a visit in 3 day(s).   Specialty: Internal Medicine Why: [facility] Discharge F/UP Contact information: [address] [facility] [place] [address] [phone]                Discharge Exam: Ceasar Mons Weights   11/29/23 1639  Weight: 66 kg    General: He is not in acute distress. HENT:     Head: Normocephalic and atraumatic.  Cardiovascular:     Rate and Rhythm: Normal rate and regular rhythm.     Heart sounds: Normal heart sounds.  Pulmonary:     Effort: Pulmonary effort is normal.     Breath sounds: Normal breath  sounds.  Abdominal:     Palpations: Abdomen is soft.     Tenderness: There is no abdominal tenderness.  Neurological:     Mental Status: Mental status is at baseline.   Condition at discharge: good  The results of significant diagnostics from this hospitalization (including imaging, microbiology, ancillary and laboratory) are listed below for reference.   Imaging Studies: No results found.  Microbiology: Results for orders placed or performed during the hospital encounter of 04/30/23  Surgical pcr screen     Status: None   Collection Time: 04/30/23  1:32 PM   Specimen: Nasal Mucosa; Nasal Swab  Result Value Ref Range Status   MRSA, PCR NEGATIVE NEGATIVE Final   Staphylococcus aureus NEGATIVE NEGATIVE Final    Comment: (NOTE) The Xpert SA Assay (FDA approved for NASAL specimens in patients 29 years of age and older), is one component of a comprehensive surveillance program. It is not intended to diagnose infection nor to guide or monitor treatment. Performed at [facility], [address] [place], [place] [address]     Labs: CBC: Recent Labs  Lab 11/29/23 1644  WBC 11.5*  HGB 12.6*  HCT 38.0*  MCV 79.3*  PLT 288   Basic Metabolic Panel: Recent Labs  Lab 11/29/23 1644  NA 133*  K 3.5  CL 98  CO2 22  GLUCOSE 142*  BUN 19  CREATININE 1.18  CALCIUM 9.1   Liver Function Tests: Recent Labs  Lab 11/29/23 1644  AST 280*  ALT 208*  ALKPHOS 62  BILITOT 1.8*  PROT 8.2*  ALBUMIN 4.7   CBG: No results for input(s): "GLUCAP" in the last 168 hours.  Discharge time spent: greater than 30 minutes.  Signed: Delfino Lovett, MD [facility] 11/30/2023

## 2023-12-11 ENCOUNTER — Telehealth: Payer: Self-pay | Admitting: *Deleted

## 2023-12-11 NOTE — Telephone Encounter (Signed)
 Called to check on absence.  Epic was in hospital  high BP, on new meds and has followup with his doctor. He will call  after the appointment

## 2023-12-23 ENCOUNTER — Encounter: Payer: Self-pay | Admitting: *Deleted

## 2023-12-23 DIAGNOSIS — Z951 Presence of aortocoronary bypass graft: Secondary | ICD-10-CM

## 2023-12-23 NOTE — Progress Notes (Signed)
 Cardiac Individual Treatment Plan  Patient Details  Name: Brad Chen MRN: [id number] Date of Birth: October 12, 1960 Referring Provider:   Flowsheet Row Cardiac Rehab from 08/05/2023 in [facility] Cardiac and Pulmonary Rehab  Referring Provider Dr. Einar Crow, MD       Initial Encounter Date:  Flowsheet Row Cardiac Rehab from 08/05/2023 in [facility] Cardiac and Pulmonary Rehab  Date 08/05/23       Visit Diagnosis: S/P CABG x 3  Patient's Home Medications on Admission:  Current Outpatient Medications:    aspirin EC 81 MG tablet, Take 1 tablet (81 mg total) by mouth daily. Swallow whole., Disp: 30 tablet, Rfl: 0   atorvastatin (LIPITOR) 10 MG tablet, Take 1 tablet (10 mg total) by mouth daily., Disp: 90 tablet, Rfl: 0   folic acid (FOLVITE) 400 MCG tablet, Take 1 tablet (400 mcg total) by mouth daily., Disp: 30 tablet, Rfl: 0   metoprolol tartrate (LOPRESSOR) 50 MG tablet, Take 1 tablet (50 mg total) by mouth 2 (two) times daily., Disp: 180 tablet, Rfl: 0   nitroGLYCERIN (NITROSTAT) 0.4 MG SL tablet, Place 1 tablet (0.4 mg total) under the tongue every 5 (five) minutes as needed., Disp: 30 tablet, Rfl: 0   pantoprazole (PROTONIX) 40 MG tablet, Take 1 tablet (40 mg total) by mouth daily., Disp: 90 tablet, Rfl: 0  Past Medical History: Past Medical History:  Diagnosis Date   Aortic atherosclerosis (HCC)    CAD (coronary artery disease) 11/12/2022   a.) CT chest 11/12/2022: extensive CAD; b.) CT chest 02/25/2023: extensive CAD   Carotid arterial disease (HCC)    a.) CT soft tissue neck 03/25/2023: severe RICA stenosis with (+) string sign; b.) carotid doppler 04/14/2023: 60-79% RICA, 1-39% LICA   Cerebral microvascular disease    a.) s/p TIA 2018   DDD (degenerative disc disease), cervical    a.) s/p C5-C6 ACDF   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   a.) s/p partial pharyngectomy, small FOM lesion resection, biodesign placement 11/25/2022 --> pathology (+) for 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); p16 and HPV (-)   Status post carpal tunnel release of both wrists    Subclavian artery stenosis, left (HCC) 01/2023   a.) s/p PTA and stenting 02/13/2023--> tandem proximal LEFT subclavian and LEFT axillary artery stents   TIA (transient ischemic attack) 2018   Torus mandibularis    a.) s/p excision 11/25/2022    Tobacco Use: Social History   Tobacco Use  Smoking Status Former   Average  packs/day: 1 pack/day for 33.0 years (33.0 ttl pk-yrs)   Types: Cigarettes   Start date: 1990  Smokeless Tobacco Never    Labs: Review Flowsheet        No data to display           Exercise Target Goals: Exercise Program Goal: Individual exercise prescription set using results from initial 6 min walk test and THRR while considering  patient's activity barriers and safety.   Exercise Prescription Goal: Initial exercise prescription builds to 30-45 minutes a day of aerobic activity, 2-3 days per week.  Home exercise guidelines will be given to patient during program as part of  exercise prescription that the participant will acknowledge.   Education: Aerobic Exercise: - Group verbal and visual presentation on the components of exercise prescription. Introduces F.I.T.T principle from ACSM for exercise prescriptions.  Reviews F.I.T.T. principles of aerobic exercise including progression. Written material given at graduation. Flowsheet Row Cardiac Rehab from 08/05/2023 in [facility] Cardiac and Pulmonary Rehab  Education need identified 08/05/23       Education: Resistance Exercise: - Group verbal and visual presentation on the components of exercise prescription. Introduces F.I.T.T principle from ACSM for exercise prescriptions  Reviews F.I.T.T. principles of resistance exercise including progression. Written material given at graduation.    Education: Exercise & Equipment Safety: - Individual verbal instruction and demonstration of equipment use and safety with use of the equipment. Flowsheet Row Cardiac Rehab from 08/05/2023 in [facility] Cardiac and Pulmonary Rehab  Date 08/05/23  Educator NT  Instruction Review Code 1- Verbalizes Understanding       Education: Exercise Physiology & General Exercise Guidelines: - Group verbal and written instruction with models to review the exercise physiology of the cardiovascular system and associated critical values. Provides general exercise guidelines with specific guidelines to those with heart or lung disease.    Education: Flexibility, Balance, Mind/Body Relaxation: - Group verbal and visual presentation with interactive activity on the components of exercise prescription. Introduces F.I.T.T principle from ACSM for exercise prescriptions. Reviews F.I.T.T. principles of flexibility and balance exercise training including progression. Also discusses the mind body connection.  Reviews various relaxation techniques to help reduce and manage stress (i.e. Deep breathing, progressive muscle relaxation, and visualization). Balance  handout provided to take home. Written material given at graduation.   Activity Barriers & Risk Stratification:  Activity Barriers & Cardiac Risk Stratification - 08/05/23 0912       Activity Barriers & Cardiac Risk Stratification   Activity Barriers Joint Problems;Other (comment)    Comments Hx Shoulder Surgery, Neuropathy in feet    Cardiac Risk Stratification High             6 Minute Walk:  6 Minute Walk     Row Name 08/05/23 0910         6 Minute Walk   Phase Initial     Distance 1330 feet     Walk Time 6 minutes     # of Rest Breaks 0     MPH 2.52     METS 4.04     RPE 6     Perceived Dyspnea  0     VO2 Peak 14.14     Symptoms No     Resting HR 62 bpm     Resting BP 152/74     Resting Oxygen Saturation  100 %     Exercise Oxygen Saturation  during 6 min walk 99 %     Max Ex. HR 103  bpm     Max Ex. BP 164/76     2 Minute Post BP 158/76              Oxygen Initial Assessment:   Oxygen Re-Evaluation:  Oxygen Re-Evaluation     Row Name 08/10/23 0930             Goals/Expected Outcomes   Comments Reviewed PLB technique with pt.  Talked about how it works and it's importance in maintaining their exercise saturations.       Goals/Expected Outcomes Short: Become more profiecient at using PLB. Long: Become independent at using PLB.                Oxygen Discharge (Final Oxygen Re-Evaluation):  Oxygen Re-Evaluation - 08/10/23 0930       Goals/Expected Outcomes   Comments Reviewed PLB technique with pt.  Talked about how it works and it's importance in maintaining their exercise saturations.    Goals/Expected Outcomes Short: Become more profiecient at using PLB. Long: Become independent at using PLB.             Initial Exercise Prescription:  Initial Exercise Prescription - 08/05/23 1100       Date of Initial Exercise RX and Referring Provider   Date 08/05/23    Referring Provider Dr. Einar Crow, MD      Oxygen    Maintain Oxygen Saturation 88% or higher      Treadmill   MPH 2.5    Grade 1    Minutes 15    METs 3.26      NuStep   Level 3   T6 nustep   SPM 80    Minutes 15    METs 4.04      REL-XR   Level 3    Speed 50    Minutes 15    METs 4.04      Prescription Details   Frequency (times per week) 3    Duration Progress to 30 minutes of continuous aerobic without signs/symptoms of physical distress      Intensity   THRR 40-80% of Max Heartrate 100-138    Ratings of Perceived Exertion 11-13    Perceived Dyspnea 0-4      Progression   Progression Continue to progress workloads to maintain intensity without signs/symptoms of physical distress.      Resistance Training   Training Prescription Yes    Weight 2 lb    Reps 10-15             Perform Capillary Blood Glucose checks as needed.  Exercise Prescription Changes:   Exercise Prescription Changes     Row Name 08/05/23 1100 08/25/23 1500 09/11/23 0700 10/06/23 1000 10/12/23 1000     Response to Exercise   Blood Pressure (Admit) 152/74 140/72 128/72 128/68 --   Blood Pressure (Exercise) 164/76 152/68 140/72 122/68 --   Blood Pressure (Exit) 158/76 110/62 122/62 126/64 --   Heart Rate (Admit) 62 bpm 119 bpm 104 bpm 107 bpm --   Heart Rate (Exercise) 103 bpm 137 bpm 131 bpm 135 bpm --   Heart Rate (Exit) 67 bpm 125 bpm 83 bpm 117 bpm --   Oxygen Saturation (Admit) 100 % -- -- -- --   Oxygen Saturation (Exercise) 99 % -- -- -- --   Rating of Perceived Exertion (Exercise) 6 13 13 13  --   Perceived Dyspnea (Exercise) 0 0 -- 0 --   Symptoms none none none none --  Comments Results -- -- -- --   Duration -- Progress to 30 minutes of  aerobic without signs/symptoms of physical distress Continue with 30 min of aerobic exercise without signs/symptoms of physical distress. Continue with 30 min of aerobic exercise without signs/symptoms of physical distress. Continue with 30 min of aerobic exercise without signs/symptoms  of physical distress.   Intensity -- THRR unchanged THRR unchanged THRR unchanged THRR unchanged     Progression   Progression -- Continue to progress workloads to maintain intensity without signs/symptoms of physical distress. Continue to progress workloads to maintain intensity without signs/symptoms of physical distress. Continue to progress workloads to maintain intensity without signs/symptoms of physical distress. Continue to progress workloads to maintain intensity without signs/symptoms of physical distress.   Average METs -- 3.07 3.21 2.4 2.4     Resistance Training   Training Prescription -- Yes Yes Yes Yes   Weight -- 2 lb 2 lb 2 lb 2 lb   Reps -- 10-15 10-15 10-15 10-15     Interval Training   Interval Training -- No No No No     Treadmill   MPH -- 2.3 2.6 2.3 2.3   Grade -- 0.5 0 0 0   Minutes -- 15 15 15 15    METs -- 2.92 2.99 2.76 2.76     NuStep   Level -- 3  T6 2  T6 nustep -- --   Minutes -- 15 15 -- --   METs -- 2.3 2.84 -- --     REL-XR   Level -- 2 5 7 7    Minutes -- 15 15 15 15    METs -- 3.4 3.5 1.8 1.8     Home Exercise Plan   Plans to continue exercise at -- -- -- -- Home (comment)  TM, Rec Elliptical, and weight machines at home   Frequency -- -- -- -- Add 2 additional days to program exercise sessions.   Initial Home Exercises Provided -- -- -- -- 10/12/23     Oxygen   Maintain Oxygen Saturation -- 88% or higher 88% or higher 88% or higher 88% or higher    Row Name 10/21/23 0800 11/06/23 1100 11/20/23 0900 12/02/23 1000       Response to Exercise   Blood Pressure (Admit) 142/76 98/54 126/82 128/72    Blood Pressure (Exit) 158/74 104/54 122/68 138/82    Heart Rate (Admit) 129 bpm 61 bpm 78 bpm 82 bpm    Heart Rate (Exercise) 150 bpm 144 bpm 103 bpm 130 bpm    Heart Rate (Exit) 101 bpm 67 bpm 62 bpm 98 bpm    Rating of Perceived Exertion (Exercise) 13 13 13 12     Symptoms none none none none    Duration Continue with 30 min of aerobic exercise  without signs/symptoms of physical distress. Continue with 30 min of aerobic exercise without signs/symptoms of physical distress. Continue with 30 min of aerobic exercise without signs/symptoms of physical distress. Continue with 30 min of aerobic exercise without signs/symptoms of physical distress.    Intensity THRR unchanged THRR unchanged THRR unchanged THRR unchanged      Progression   Progression Continue to progress workloads to maintain intensity without signs/symptoms of physical distress. Continue to progress workloads to maintain intensity without signs/symptoms of physical distress. Continue to progress workloads to maintain intensity without signs/symptoms of physical distress. Continue to progress workloads to maintain intensity without signs/symptoms of physical distress.    Average METs 2.64 2.62 2.75 2.78  Resistance Training   Training Prescription Yes Yes Yes Yes    Weight 2 lb 2 lb 2 lb 2 lb    Reps 10-15 10-15 10-15 10-15      Interval Training   Interval Training No No No No      Treadmill   MPH 2.4 2.3 3 2.5    Grade 0 0 0 1.5    Minutes 15 15 15 15     METs 2.84 2.76 3.3 3.43      NuStep   Level 4  T6 2  T6 3  T6 nustep 2  T6    Minutes 15 15 15 15     METs 2.2 2.5 2.2 2.4      REL-XR   Level 3 3 -- 3    Minutes 15 15 -- 15      Home Exercise Plan   Plans to continue exercise at Home (comment)  TM, Rec Elliptical, and weight machines at home Home (comment)  TM, Rec Elliptical, and weight machines at home Home (comment)  TM, Rec Elliptical, and weight machines at home Home (comment)  TM, Rec Elliptical, and weight machines at home    Frequency Add 2 additional days to program exercise sessions. Add 2 additional days to program exercise sessions. Add 2 additional days to program exercise sessions. Add 2 additional days to program exercise sessions.    Initial Home Exercises Provided 10/12/23 10/12/23 10/12/23 10/12/23      Oxygen   Maintain Oxygen  Saturation 88% or higher 88% or higher 88% or higher 88% or higher             Exercise Comments:   Exercise Comments     Row Name 08/10/23 1191           Exercise Comments First full day of exercise!  Patient was oriented to gym and equipment including functions, settings, policies, and procedures.  Patient's individual exercise prescription and treatment plan were reviewed.  All starting workloads were established based on the results of the 6 minute walk test done at initial orientation visit.  The plan for exercise progression was also introduced and progression will be customized based on patient's performance and goals.                Exercise Goals and Review:   Exercise Goals     Row Name 08/05/23 0912             Exercise Goals   Increase Physical Activity Yes       Intervention Provide advice, education, support and counseling about physical activity/exercise needs.;Develop an individualized exercise prescription for aerobic and resistive training based on initial evaluation findings, risk stratification, comorbidities and participant's personal goals.       Expected Outcomes Short Term: Attend rehab on a regular basis to increase amount of physical activity.;Long Term: Add in home exercise to make exercise part of routine and to increase amount of physical activity.;Long Term: Exercising regularly at least 3-5 days a week.       Increase Strength and Stamina Yes       Intervention Provide advice, education, support and counseling about physical activity/exercise needs.;Develop an individualized exercise prescription for aerobic and resistive training based on initial evaluation findings, risk stratification, comorbidities and participant's personal goals.       Expected Outcomes Short Term: Increase workloads from initial exercise prescription for resistance, speed, and METs.;Short Term: Perform resistance training exercises routinely during rehab and add in  resistance  training at home;Long Term: Improve cardiorespiratory fitness, muscular endurance and strength as measured by increased METs and functional capacity ( )       Able to understand and use rate of perceived exertion (RPE) scale Yes       Intervention Provide education and explanation on how to use RPE scale       Expected Outcomes Short Term: Able to use RPE daily in rehab to express subjective intensity level;Long Term:  Able to use RPE to guide intensity level when exercising independently       Able to understand and use Dyspnea scale Yes       Intervention Provide education and explanation on how to use Dyspnea scale       Expected Outcomes Short Term: Able to use Dyspnea scale daily in rehab to express subjective sense of shortness of breath during exertion;Long Term: Able to use Dyspnea scale to guide intensity level when exercising independently       Knowledge and understanding of Target Heart Rate Range (THRR) Yes       Intervention Provide education and explanation of THRR including how the numbers were predicted and where they are located for reference       Expected Outcomes Long Term: Able to use THRR to govern intensity when exercising independently;Short Term: Able to state/look up THRR;Short Term: Able to use daily as guideline for intensity in rehab       Able to check pulse independently Yes       Intervention Provide education and demonstration on how to check pulse in carotid and radial arteries.;Review the importance of being able to check your own pulse for safety during independent exercise       Expected Outcomes Short Term: Able to explain why pulse checking is important during independent exercise;Long Term: Able to check pulse independently and accurately       Understanding of Exercise Prescription Yes       Intervention Provide education, explanation, and written materials on patient's individual exercise prescription       Expected Outcomes Short Term: Able to  explain program exercise prescription;Long Term: Able to explain home exercise prescription to exercise independently                Exercise Goals Re-Evaluation :  Exercise Goals Re-Evaluation     Row Name 08/10/23 0929 08/25/23 1540 08/27/23 1209 09/11/23 0743 09/24/23 1536     Exercise Goal Re-Evaluation   Exercise Goals Review Able to understand and use rate of perceived exertion (RPE) scale;Able to understand and use Dyspnea scale;Knowledge and understanding of Target Heart Rate Range (THRR);Understanding of Exercise Prescription Increase Physical Activity;Increase Strength and Stamina;Understanding of Exercise Prescription Increase Physical Activity Increase Physical Activity;Increase Strength and Stamina;Understanding of Exercise Prescription Increase Physical Activity;Increase Strength and Stamina;Understanding of Exercise Prescription   Comments Reviewed RPE and dyspnea scale, THR and program prescription with pt today.  Pt voiced understanding and was given a copy of goals to take home. Brad Chen is off to a good start in the program. He continues to get familiar with his exercise prescription and the modalities involved. He was able to use his prescribed machines at their prescribed intensity and we will continue to monitor his progression in the program. Brad Chen wants to get back to using his TM and Rec Bike at home. He was using the equipment daily with his wife at least for 30 min.  Advised him to talk with EP about home exercise and any limitation. Brad Chen  is doing well in rehab. He recently increased his speed on the treadmill to 2.6 mph with no incline. He also improved to level 5 on the XR and continues to work at level 2 on the T6 nustep. We will continue to monitor his progress in the program. Brad Chen has not attended rehab during this review. His last visit was 12/27. We will reach out in order to determine his status.   Expected Outcomes Short: Use RPE daily to regulate intensity. Long:  Follow program prescription in THR. Short: Continue to follow current exercise prescription. Long: Continue exercise to improve strength and stamina. STG Speaks with EP staff about home exercise plan  LTG is exercing at home and CR as prescribed Short: Continue to progressively increase treadmill workload. Long: Continue exercise to improve strength and stamina. Short: Return to rehab. Long: Continue exercise to improve strength and stamina.    Row Name 10/06/23 1012 10/12/23 1001 10/21/23 0816 11/06/23 1120 11/20/23 0943     Exercise Goal Re-Evaluation   Exercise Goals Review Increase Physical Activity;Increase Strength and Stamina;Understanding of Exercise Prescription Increase Physical Activity;Able to understand and use rate of perceived exertion (RPE) scale;Knowledge and understanding of Target Heart Rate Range (THRR);Understanding of Exercise Prescription;Increase Strength and Stamina;Able to understand and use Dyspnea scale;Able to check pulse independently Increase Physical Activity;Understanding of Exercise Prescription;Increase Strength and Stamina Increase Physical Activity;Understanding of Exercise Prescription;Increase Strength and Stamina Increase Physical Activity;Understanding of Exercise Prescription;Increase Strength and Stamina   Comments Brad Chen was able to return to rehab and is doing well. He was recently able to increase his level on the XR from level 5 to level 7. He also was able to use the treadmill but did decrease his speed slightly to 2.75mph. We will continue to monitor his progress in the program. Reviewed home exercise with pt today.  Pt plans to [place] his TM, recumbent elliptical, and weight machines at home  for exercise.  Reviewed THR, pulse, RPE, sign and symptoms, pulse oximetery and when to call 911 or MD.  Also discussed weather considerations and indoor options.  Pt voiced understanding. Brad Chen is doing well in rehab. He was able to increase his speed to 2.4 mph on the  treadmill. He also increased to level 4 on the T6 nustep. He decreased to level 3 on the XR, when previously was able to do level 7. We will continue to monitor his progress in the program. Brad Chen is doing well in rehab. He continues to do well walking on the treadmill at a speed of 2.3 mph with no incline. He also saw a decrease from level 4 to level 2 on the T6 nustep and a decrease from level 7 to level 3 on the XR. We will continue to monitor his progress in the program. Brad Chen has only attended one session since the last review. During this session he was able to reach an increased speed on the treadmill of 3 mph with no incline. We will encourage im to attend rehab more regularly and continue to monitor his progress.   Expected Outcomes Short: Continue to follow exercise prescription. Long: Continue exercise to improve strength and stamina. Shot: add 1-2 days a week of exercise at home on off days of cardiac rehab. Long: maintain independent exercise program upon graduation from cardiac rehab. Short: Try to increase back to level 7 on the XR. Long: Continue exercise to improve strength and stamina. Short: Try to increase back to level 7 on the XR and level 4 on  the T6 nustep. Long: Continue exercise to improve strength and stamina. Short: Attend rehab more consistently. Long: Continue exercise to improve strength and stamina.    Row Name 11/23/23 8657 12/02/23 1031 12/14/23 1726         Exercise Goal Re-Evaluation   Exercise Goals Review Increase Physical Activity;Increase Strength and Stamina;Understanding of Exercise Prescription Increase Physical Activity;Increase Strength and Stamina;Understanding of Exercise Prescription Increase Physical Activity;Increase Strength and Stamina;Understanding of Exercise Prescription     Comments Brad Chen has a treadmill and a exercise bike at home, both he and his wife use them regularly. Here at rehab he is wokring on increasing his workload on treadmill. Brad Chen is  doing well in rehab. He was able to increase his incline on th etreadmill from 0% to 1.5%. He was also able to maintain his workload on the XR at level 3. We will continue to monitor his progress in the program. Brad Chen has not attended since 11/23/2023. We spoke with him on 3/28 on the phone and he was in hospital due to high BP and is on new meds and has followup with his doctor. He will call after the appointment to determine when to return to rehab.     Expected Outcomes STG: increase workload here at rehab. LTG Continue to exercise to improve strength and stamina Short: Increase level on T6 nustep, increase handweights to 3lbs. Long: Continue exercise to improve strength and stamina. Short: Return to rehab when able to. Long: Continue exercise to improve strength and stamina.              Discharge Exercise Prescription (Final Exercise Prescription Changes):  Exercise Prescription Changes - 12/02/23 1000       Response to Exercise   Blood Pressure (Admit) 128/72    Blood Pressure (Exit) 138/82    Heart Rate (Admit) 82 bpm    Heart Rate (Exercise) 130 bpm    Heart Rate (Exit) 98 bpm    Rating of Perceived Exertion (Exercise) 12    Symptoms none    Duration Continue with 30 min of aerobic exercise without signs/symptoms of physical distress.    Intensity THRR unchanged      Progression   Progression Continue to progress workloads to maintain intensity without signs/symptoms of physical distress.    Average METs 2.78      Resistance Training   Training Prescription Yes    Weight 2 lb    Reps 10-15      Interval Training   Interval Training No      Treadmill   MPH 2.5    Grade 1.5    Minutes 15    METs 3.43      NuStep   Level 2   T6   Minutes 15    METs 2.4      REL-XR   Level 3    Minutes 15      Home Exercise Plan   Plans to continue exercise at Home (comment)   TM, Rec Elliptical, and weight machines at home   Frequency Add 2 additional days to program exercise  sessions.    Initial Home Exercises Provided 10/12/23      Oxygen   Maintain Oxygen Saturation 88% or higher             Nutrition:  Target Goals: Understanding of nutrition guidelines, daily intake of sodium 1500mg , cholesterol 200mg , calories 30% from fat and 7% or less from saturated fats, daily to have 5 or more servings of  fruits and vegetables.  Education: All About Nutrition: -Group instruction provided by verbal, written material, interactive activities, discussions, models, and posters to present general guidelines for heart healthy nutrition including fat, fiber, MyPlate, the role of sodium in heart healthy nutrition, utilization of the nutrition label, and utilization of this knowledge for meal planning. Follow up email sent as well. Written material given at graduation. Flowsheet Row Cardiac Rehab from 08/05/2023 in [facility] Card[facility] and Pul[facility] Rehab  Education need identified 08/05/23       Biometrics:  Pre Biometrics - 08/05/23 0913       Pre Biometrics   Height 5' 9.5" (1.765 m)    Weight 146 lb 11.2 oz (66.5 kg)    Waist Circumference 31 inches    Hip Circumference 35 inches    Waist to Hip Ratio 0.89 %    BMI (Calculated) 21.36    Single Leg Stand 8 seconds              Nutrition Therapy Plan and Nutrition Goals:  Nutrition Therapy & Goals - 08/27/23 1211       Nutrition Therapy   RD appointment deferred Yes    Diet deferred meeting with RD 08/26/2023             Nutrition Assessments:  MEDIFICTS Score Key: >=70 Need to make dietary changes  40-70 Heart Healthy Diet <= 40 Therapeutic Level Cholesterol Diet  Flowsheet Row Cardiac Rehab from 08/05/2023 in [facility] Card[facility] Rehab  Picture Your Plate Total Score on Admission 49      Picture Your Plate Scores: <29 Unhealthy dietary pattern with much room for improvement. 41-50 Dietary pattern unlikely to meet recommendations for good health and room for improvement. 51-60  More healthful dietary pattern, with some room for improvement.  >60 Healthy dietary pattern, although there may be some specific behaviors that could be improved.    Nutrition Goals Re-Evaluation:  Nutrition Goals Re-Evaluation     Row Name 09/28/23 [phone] 11/23/23 0955           Goals   Comment deferred RD appointment Brad Chen reports he and his wife are good eaters, often choosing a lean meat with veggies and sometimes a starch. Discussed balanced plates, sodium limitations and cutting back on saturated fats.      Expected Outcome -- STG: read facts labels and monitor sodium intake. LTG: Maintain a heart healthy diet               Nutrition Goals Discharge (Final Nutrition Goals Re-Evaluation):  Nutrition Goals Re-Evaluation - 11/23/23 0955       Goals   Comment Brad Chen reports he and his wife are good eaters, often choosing a lean meat with veggies and sometimes a starch. Discussed balanced plates, sodium limitations and cutting back on saturated fats.    Expected Outcome STG: read facts labels and monitor sodium intake. LTG: Maintain a heart healthy diet             Psychosocial: Target Goals: Acknowledge presence or absence of significant depression and/or stress, maximize coping skills, provide positive support system. Participant is able to verbalize types and ability to use techniques and skills needed for reducing stress and depression.   Education: Stress, Anxiety, and Depression - Group verbal and visual presentation to define topics covered.  Reviews how body is impacted by stress, anxiety, and depression.  Also discusses healthy ways to reduce stress and to treat/manage anxiety and depression.  Written material given at graduation.  Education: Sleep Hygiene -Provides group verbal and written instruction about how sleep can affect your health.  Define sleep hygiene, discuss sleep cycles and impact of sleep habits. Review good sleep hygiene tips.    Initial Review  & Psychosocial Screening:  Initial Psych Review & Screening - 07/28/23 1453       Initial Review   Current issues with None Identified      Family Dynamics   Good Support System? Yes   wife     Barriers   Psychosocial barriers to participate in program There are no identifiable barriers or psychosocial needs.      Screening Interventions   Interventions Encouraged to exercise;Provide feedback about the scores to participant;To provide support and resources with identified psychosocial needs    Expected Outcomes Short Term goal: Utilizing psychosocial counselor, staff and physician to assist with identification of specific Stressors or current issues interfering with healing process. Setting desired goal for each stressor or current issue identified.;Long Term Goal: Stressors or current issues are controlled or eliminated.;Short Term goal: Identification and review with participant of any Quality of Life or Depression concerns found by scoring the questionnaire.;Long Term goal: The participant improves quality of Life and PHQ9 Scores as seen by post scores and/or verbalization of changes             Quality of Life Scores:   Quality of Life - 08/05/23 1124       Quality of Life   Select Quality of Life      Quality of Life Scores   Health/Function Pre 28.33 %    Socioeconomic Pre 30 %    Psych/Spiritual Pre 28.29 %    Family Pre 30 %    GLOBAL Pre 28.91 %            Scores of 19 and below usually indicate a poorer quality of life in these areas.  A difference of  2-3 points is a clinically meaningful difference.  A difference of 2-3 points in the total score of the Quality of Life Index has been associated with significant improvement in overall quality of life, self-image, physical symptoms, and general health in studies assessing change in quality of life.  PHQ-9: Review Flowsheet       08/05/2023  Depression screen PHQ 2/9  Decreased Interest 0  Down, Depressed,  Hopeless 0  PHQ - 2 Score 0  Altered sleeping 1  Tired, decreased energy 0  Change in appetite 0  Feeling bad or failure about yourself  0  Trouble concentrating 0  Moving slowly or fidgety/restless 0  Suicidal thoughts 0  PHQ-9 Score 1  Difficult doing work/chores Not difficult at all   Interpretation of Total Score  Total Score Depression Severity:  1-4 = Minimal depression, 5-9 = Mild depression, 10-14 = Moderate depression, 15-19 = Moderately severe depression, 20-27 = Severe depression   Psychosocial Evaluation and Intervention:  Psychosocial Evaluation - 07/28/23 1503       Psychosocial Evaluation & Interventions   Interventions Encouraged to exercise with the program and follow exercise prescription    Comments 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 He states he feels like this surgery has been easier to recover from compared to his previous surgeries. 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 He is retired from [employer]. He has a home gym and up until 6 months ago, was using it consistently. He wants  to get back to a regular exercise routine. He states his wife and his 4 dogs are his main support system.    Expected Outcomes Short: attend cardiac rehab for education and exercise. Long: Develop and maintain positive self care habits    Continue Psychosocial Services  Follow up required by staff             Psychosocial Re-Evaluation:  Psychosocial Re-Evaluation     Row Name 08/27/23 1212 09/28/23 [id number] 11/23/23 0957         Psychosocial Re-Evaluation   Current issues with None Identified None Identified None Identified     Comments Continues without any psychosocila concerns Patient reports no issues with their current mental states, sleep, stress, depression or anxiety. Will follow up with patient in a few weeks for any changes. Patient reports no concerns with stress, anxiety, or depression. He says he is  sleeping well. Has a good support system with his wife.     Expected Outcomes STG  attends all scheduled session exercise and education  LTG maintains without concerns Short: Continue to exercise regularly to support mental health and notify staff of any changes. Long: maintain mental health and well being through teaching of rehab or prescribed medications independently. STG: continue to use support system to stay positive. LTG: maintain a positive outlood on health and daily life     Interventions Encouraged to attend Cardiac Rehabilitation for the exercise Encouraged to attend Cardiac Rehabilitation for the exercise Encouraged to attend Cardiac Rehabilitation for the exercise     Continue Psychosocial Services  Follow up required by staff Follow up required by staff Follow up required by staff              Psychosocial Discharge (Final Psychosocial Re-Evaluation):  Psychosocial Re-Evaluation - 11/23/23 0957       Psychosocial Re-Evaluation   Current issues with None Identified    Comments Patient reports no concerns with stress, anxiety, or depression. He says he is sleeping well. Has a good support system with his wife.    Expected Outcomes STG: continue to use support system to stay positive. LTG: maintain a positive outlood on health and daily life    Interventions Encouraged to attend Cardiac Rehabilitation for the exercise    Continue Psychosocial Services  Follow up required by staff             Vocational Rehabilitation: Provide vocational rehab assistance to qualifying candidates.   Vocational Rehab Evaluation & Intervention:  Vocational Rehab - 07/28/23 1453       Initial Vocational Rehab Evaluation & Intervention   Assessment shows need for Vocational Rehabilitation No             Education: Education Goals: Education classes will be provided on a variety of topics geared toward better understanding of heart health and risk factor modification. Participant  will state understanding/return demonstration of topics presented as noted by education test scores.  Learning Barriers/Preferences:  Learning Barriers/Preferences - 07/28/23 1453       Learning Barriers/Preferences   Learning Barriers None    Learning Preferences None             General Cardiac Education Topics:  AED/CPR: - Group verbal and written instruction with the use of models to demonstrate the basic use of the AED with the basic ABC's of resuscitation.   Anatomy and Cardiac Procedures: - Group verbal and visual presentation and models provide information about basic cardiac anatomy and function. Reviews the  testing methods done to diagnose heart disease and the outcomes of the test results. Describes the treatment choices: Medical Management, Angioplasty, or Coronary Bypass Surgery for treating various heart conditions including Myocardial Infarction, Angina, Valve Disease, and Cardiac Arrhythmias.  Written material given at graduation.   Medication Safety: - Group verbal and visual instruction to review commonly prescribed medications for heart and lung disease. Reviews the medication, class of the drug, and side effects. Includes the steps to properly store meds and maintain the prescription regimen.  Written material given at graduation.   Intimacy: - Group verbal instruction through game format to discuss how heart and lung disease can affect sexual intimacy. Written material given at graduation..   Know Your Numbers and Heart Failure: - Group verbal and visual instruction to discuss disease risk factors for cardiac and pulmonary disease and treatment options.  Reviews associated critical values for Overweight/Obesity, Hypertension, Cholesterol, and Diabetes.  Discusses basics of heart failure: signs/symptoms and treatments.  Introduces Heart Failure Zone chart for action plan for heart failure.  Written material given at graduation. Flowsheet Row Cardiac Rehab from  08/05/2023 in [facility] Cardiac and Pulmonary Rehab  Education need identified 08/05/23       Infection Prevention: - Provides verbal and written material to individual with discussion of infection control including proper hand washing and proper equipment cleaning during exercise session. Flowsheet Row Cardiac Rehab from 08/05/2023 in [facility] Cardiac and Pulmonary Rehab  Date 08/05/23  Educator NT  Instruction Review Code 1- Verbalizes Understanding       Falls Prevention: - Provides verbal and written material to individual with discussion of falls prevention and safety. Flowsheet Row Cardiac Rehab from 08/05/2023 in [facility] Cardiac and Pulmonary Rehab  Date 08/05/23  Educator NT  Instruction Review Code 1- Verbalizes Understanding       Other: -Provides group and verbal instruction on various topics (see comments)   Knowledge Questionnaire Score:  Knowledge Questionnaire Score - 08/05/23 1123       Knowledge Questionnaire Score   Pre Score 22/26             Core Components/Risk Factors/Patient Goals at Admission:  Personal Goals and Risk Factors at Admission - 07/28/23 1450       Core Components/Risk Factors/Patient Goals on Admission    Weight Management Yes;Weight Maintenance    Intervention Weight Management: Develop a combined nutrition and exercise program designed to reach desired caloric intake, while maintaining appropriate intake of nutrient and fiber, sodium and fats, and appropriate energy expenditure required for the weight goal.;Weight Management: Provide education and appropriate resources to help participant work on and attain dietary goals.    Expected Outcomes Weight Maintenance: Understanding of the daily nutrition guidelines, which includes 25-35% calories from fat, 7% or less cal from saturated fats, less than 200mg  cholesterol, less than 1.5gm of sodium, & 5 or more servings of fruits and vegetables daily;Understanding of distribution of calorie intake  throughout the day with the consumption of 4-5 meals/snacks;Understanding recommendations for meals to include 15-35% energy as protein, 25-35% energy from fat, 35-60% energy from carbohydrates, less than 200mg  of dietary cholesterol, 20-35 gm of total fiber daily    Hypertension Yes    Intervention Provide education on lifestyle modifcations including regular physical activity/exercise, weight management, moderate sodium restriction and increased consumption of fresh fruit, vegetables, and low fat dairy, alcohol moderation, and smoking cessation.;Monitor prescription use compliance.    Expected Outcomes Short Term: Continued assessment and intervention until BP is < 140/65mm HG  in hypertensive participants. < 130/46mm HG in hypertensive participants with diabetes, heart failure or chronic kidney disease.;Long Term: Maintenance of blood pressure at goal levels.    Lipids Yes    Intervention Provide education and support for participant on nutrition & aerobic/resistive exercise along with prescribed medications to achieve LDL 70mg , HDL >40mg .    Expected Outcomes Short Term: Participant states understanding of desired cholesterol values and is compliant with medications prescribed. Participant is following exercise prescription and nutrition guidelines.;Long Term: Cholesterol controlled with medications as prescribed, with individualized exercise RX and with personalized nutrition plan. Value goals: LDL < 70mg , HDL > 40 mg.             Education:Diabetes - Individual verbal and written instruction to review signs/symptoms of diabetes, desired ranges of glucose level fasting, after meals and with exercise. Acknowledge that pre and post exercise glucose checks will be done for 3 sessions at entry of program.   Core Components/Risk Factors/Patient Goals Review:   Goals and Risk Factor Review     Row Name 08/27/23 1213 09/28/23 6578 11/23/23 0953         Core Components/Risk Factors/Patient  Goals Review   Personal Goals Review Weight Management/Obesity;Lipids Hypertension Hypertension     Review Brad Chen has lost weight and wants to gain back to 190lB, he is at 146 lb today. He was required muscle mass for work and is trying to gain this back.  He is compliant with his meds for the lipid control and does monitor intake of fats and is exercising to help with risk factor control. Brad Chen has been doing well in the program. He has been able to check his blood pressure at home. It has ben within normal ranges like 120s to 130 diastolic. He has no questions avbout his medications at this time. He has been able to increase his work loads in class and will continue to do so. Brad Chen reports he has a blood pressure cuff at home and checks his BP regularly and it has been well controlled within normal levels. Encouraged him to continue to check BP and increase workload here at rehab as able     Expected Outcomes STG   Continue with exercise prescrotion as tolerated, monitor fat intake for lipid control. maintain compliance with meds  LTG Maintiang healthy lifestyle after discharge Short: increase work loads and check blood pressure at home. Long: maintain BP checks and exercise independently STG: increase workload here at rehab and control BP. LTG: maintain BP checks and exercise independently              Core Components/Risk Factors/Patient Goals at Discharge (Final Review):   Goals and Risk Factor Review - 11/23/23 0953       Core Components/Risk Factors/Patient Goals Review   Personal Goals Review Hypertension    Review Brad Chen reports he has a blood pressure cuff at home and checks his BP regularly and it has been well controlled within normal levels. Encouraged him to continue to check BP and increase workload here at rehab as able    Expected Outcomes STG: increase workload here at rehab and control BP. LTG: maintain BP checks and exercise independently             ITP Comments:  ITP  Comments     Row Name 07/28/23 1501 08/05/23 0909 08/10/23 0929 09/02/23 0959 09/30/23 1307   ITP Comments Initial phone call completed. Diagnosis can be found in [facility] 10/20. EP Orientation scheduled for Thursday 11/14  at 9am. Completed and gym orientation. Initial ITP created and sent for review to Dr. Bethann Punches, Medical Director. First full day of exercise!  Patient was oriented to gym and equipment including functions, settings, policies, and procedures.  Patient's individual exercise prescription and treatment plan were reviewed.  All starting workloads were established based on the results of the 6 minute walk test done at initial orientation visit.  The plan for exercise progression was also introduced and progression will be customized based on patient's performance and goals. 30 Day review completed. Medical Director ITP review done, changes made as directed, and signed approval by Medical Director.    new to program 30 Day review completed. Medical Director ITP review done, changes made as directed, and signed approval by Medical Director.    Row Name 10/28/23 1124 11/25/23 1022 12/23/23 0816       ITP Comments 30 Day review completed. Medical Director ITP review done, changes made as directed, and signed approval by Medical Director. 30 Day review completed. Medical Director ITP review done, changes made as directed, and signed approval by Medical Director. 30 Day review completed. Medical Director ITP review done, changes made as directed, and signed approval by Medical Director.   absent last visit 3/10              Comments:

## 2023-12-28 ENCOUNTER — Telehealth: Payer: Self-pay

## 2023-12-28 NOTE — Telephone Encounter (Signed)
 We called to check on patient today as he has not attended rehab since 11/23/2023. We called him on 03/28 to check in with him as well. We left a message requesting that he call us  back to inform us  about his status and when he plans to return to rehab.

## 2024-01-04 ENCOUNTER — Telehealth: Payer: Self-pay

## 2024-01-04 NOTE — Telephone Encounter (Signed)
 We attempted to call pt again today as he has not attended rehab in over a month. No answer. We left a message requesting that he call us  back to update us  on his status. If we do not hear back from him we will begn the discharge process.

## 2024-01-12 ENCOUNTER — Telehealth: Payer: Self-pay

## 2024-01-12 NOTE — Patient Instructions (Signed)
  01/12/2024  Dear Brad Chen ,         We have missed your participation in the [facility]: [facility] Card[facility] Program.  As you know, regular attendance and active participation is very important to be successful in our program.  According to our records, you have not attended class since 11/23/2023.          We will no longer be able to hold your spot open due to our waiting list of incoming participants if we do not hear back from you. We will officially drop you from our roster of active participants on 01/19/2024.           We sincerely hope that all is going well with you.  If you have any questions or concerns, please do not hesitate to contact us  at [phone].    Sincerely,    [facility] Staff

## 2024-01-12 NOTE — Telephone Encounter (Signed)
 We attempted to call Pt as he has not attended rehab since 11/23/2023 and has not returned our calls. We will send a discharge letter at this time. If we do not hear back from Pt within a week we will discharge him from the program.

## 2024-01-13 ENCOUNTER — Encounter: Payer: Self-pay | Admitting: *Deleted

## 2024-01-13 DIAGNOSIS — Z951 Presence of aortocoronary bypass graft: Secondary | ICD-10-CM

## 2024-01-13 NOTE — Progress Notes (Signed)
 Cardiac Individual Treatment Plan  Patient Details  Name: Brad Chen MRN: [id number] Date of Birth: 1961/04/19 Referring Provider:   Flowsheet Row Cardiac Rehab from 08/05/2023 in [facility] Cardiac and Pulmonary Rehab  Referring Provider Dr. Overton Blotter, MD       Initial Encounter Date:  Flowsheet Row Cardiac Rehab from 08/05/2023 in [facility] Cardiac and Pulmonary Rehab  Date 08/05/23       Visit Diagnosis: S/P CABG x 3  Patient's Home Medications on Admission:  Current Outpatient Medications:    atorvastatin  (LIPITOR) 10 MG tablet, Take 1 tablet (10 mg total) by mouth daily., Disp: 90 tablet, Rfl: 0   metoprolol  tartrate (LOPRESSOR ) 50 MG tablet, Take 1 tablet (50 mg total) by mouth 2 (two) times daily., Disp: 180 tablet, Rfl: 0   nitroGLYCERIN  (NITROSTAT ) 0.4 MG SL tablet, Place 1 tablet (0.4 mg total) under the tongue every 5 (five) minutes as needed., Disp: 30 tablet, Rfl: 0   pantoprazole  (PROTONIX ) 40 MG tablet, Take 1 tablet (40 mg total) by mouth daily., Disp: 90 tablet, Rfl: 0  Past Medical History: Past Medical History:  Diagnosis Date   Aortic atherosclerosis (HCC)    CAD (coronary artery disease) 11/12/2022   a.) CT chest 11/12/2022: extensive CAD; b.) CT chest 02/25/2023: extensive CAD   Carotid arterial disease (HCC)    a.) CT soft tissue neck 03/25/2023: severe RICA stenosis with (+) string sign; b.) carotid doppler 04/14/2023: 60-79% RICA, 1-39% LICA   Cerebral microvascular disease    a.) s/p TIA 2018   DDD (degenerative disc disease), cervical    a.) s/p C5-C6 ACDF   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   a.) s/p partial pharyngectomy, small FOM lesion resection, biodesign placement 11/25/2022 --> pathology (+) for 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); p16 and HPV (-)   Status post carpal tunnel release of both wrists    Subclavian artery stenosis, left (HCC) 01/2023   a.) s/p PTA and stenting 02/13/2023--> tandem proximal LEFT subclavian and LEFT axillary artery stents   TIA (transient ischemic attack) 2018   Torus mandibularis    a.) s/p excision 11/25/2022    Tobacco Use: Social History   Tobacco Use  Smoking Status Former   Average packs/day: 1 pack/day for 33.0 years (33.0 ttl pk-yrs)   Types: Cigarettes   Start date: 1990  Smokeless Tobacco Never    Labs: Review Flowsheet        No data to display  Exercise Target Goals: Exercise Program Goal: Individual exercise prescription set using results from initial 6 min walk test and THRR while considering  patient's activity barriers and safety.   Exercise Prescription Goal: Initial exercise prescription builds to 30-45 minutes a day of aerobic activity, 2-3 days per week.  Home exercise guidelines will be given to patient during program as part of exercise prescription that the participant will acknowledge.   Education: Aerobic Exercise: - Group verbal and visual presentation on the components of exercise prescription. Introduces F.I.T.T principle from ACSM for  exercise prescriptions.  Reviews F.I.T.T. principles of aerobic exercise including progression. Written material given at graduation. Flowsheet Row Cardiac Rehab from 08/05/2023 in [facility] Cardiac and Pulmonary Rehab  Education need identified 08/05/23       Education: Resistance Exercise: - Group verbal and visual presentation on the components of exercise prescription. Introduces F.I.T.T principle from ACSM for exercise prescriptions  Reviews F.I.T.T. principles of resistance exercise including progression. Written material given at graduation.    Education: Exercise & Equipment Safety: - Individual verbal instruction and demonstration of equipment use and safety with use of the equipment. Flowsheet Row Cardiac Rehab from 08/05/2023 in [facility] Cardiac and Pulmonary Rehab  Date 08/05/23  Educator NT  Instruction Review Code 1- Verbalizes Understanding       Education: Exercise Physiology & General Exercise Guidelines: - Group verbal and written instruction with models to review the exercise physiology of the cardiovascular system and associated critical values. Provides general exercise guidelines with specific guidelines to those with heart or lung disease.    Education: Flexibility, Balance, Mind/Body Relaxation: - Group verbal and visual presentation with interactive activity on the components of exercise prescription. Introduces F.I.T.T principle from ACSM for exercise prescriptions. Reviews F.I.T.T. principles of flexibility and balance exercise training including progression. Also discusses the mind body connection.  Reviews various relaxation techniques to help reduce and manage stress (i.e. Deep breathing, progressive muscle relaxation, and visualization). Balance handout provided to take home. Written material given at graduation.   Activity Barriers & Risk Stratification:  Activity Barriers & Cardiac Risk Stratification - 08/05/23 0912       Activity Barriers & Cardiac Risk  Stratification   Activity Barriers Joint Problems;Other (comment)    Comments Hx Shoulder Surgery, Neuropathy in feet    Cardiac Risk Stratification High             6 Minute Walk:  6 Minute Walk     Row Name 08/05/23 0910         6 Minute Walk   Phase Initial     Distance 1330 feet     Walk Time 6 minutes     # of Rest Breaks 0     MPH 2.52     METS 4.04     RPE 6     Perceived Dyspnea  0     VO2 Peak 14.14     Symptoms No     Resting HR 62 bpm     Resting BP 152/74     Resting Oxygen Saturation  100 %     Exercise Oxygen Saturation  during 6 min walk 99 %     Max Ex. HR 103 bpm     Max Ex. BP 164/76     2 Minute Post BP 158/76              Oxygen Initial Assessment:   Oxygen Re-Evaluation:  Oxygen Re-Evaluation     Row Name 08/10/23 0930  Goals/Expected Outcomes   Comments Reviewed PLB technique with pt.  Talked about how it works and it's importance in maintaining their exercise saturations.       Goals/Expected Outcomes Short: Become more profiecient at using PLB. Long: Become independent at using PLB.                Oxygen Discharge (Final Oxygen Re-Evaluation):  Oxygen Re-Evaluation - 08/10/23 0930       Goals/Expected Outcomes   Comments Reviewed PLB technique with pt.  Talked about how it works and it's importance in maintaining their exercise saturations.    Goals/Expected Outcomes Short: Become more profiecient at using PLB. Long: Become independent at using PLB.             Initial Exercise Prescription:  Initial Exercise Prescription - 08/05/23 1100       Date of Initial Exercise RX and Referring Provider   Date 08/05/23    Referring Provider Dr. Overton Blotter, MD      Oxygen   Maintain Oxygen Saturation 88% or higher      Treadmill   MPH 2.5    Grade 1    Minutes 15    METs 3.26      NuStep   Level 3   T6 nustep   SPM 80    Minutes 15    METs 4.04      REL-XR   Level 3    Speed 50     Minutes 15    METs 4.04      Prescription Details   Frequency (times per week) 3    Duration Progress to 30 minutes of continuous aerobic without signs/symptoms of physical distress      Intensity   THRR 40-80% of Max Heartrate 100-138    Ratings of Perceived Exertion 11-13    Perceived Dyspnea 0-4      Progression   Progression Continue to progress workloads to maintain intensity without signs/symptoms of physical distress.      Resistance Training   Training Prescription Yes    Weight 2 lb    Reps 10-15             Perform Capillary Blood Glucose checks as needed.  Exercise Prescription Changes:   Exercise Prescription Changes     Row Name 08/05/23 1100 08/25/23 1500 09/11/23 0700 10/06/23 1000 10/12/23 1000     Response to Exercise   Blood Pressure (Admit) 152/74 140/72 128/72 128/68 --   Blood Pressure (Exercise) 164/76 152/68 140/72 122/68 --   Blood Pressure (Exit) 158/76 110/62 122/62 126/64 --   Heart Rate (Admit) 62 bpm 119 bpm 104 bpm 107 bpm --   Heart Rate (Exercise) 103 bpm 137 bpm 131 bpm 135 bpm --   Heart Rate (Exit) 67 bpm 125 bpm 83 bpm 117 bpm --   Oxygen Saturation (Admit) 100 % -- -- -- --   Oxygen Saturation (Exercise) 99 % -- -- -- --   Rating of Perceived Exertion (Exercise) 6 13 13 13  --   Perceived Dyspnea (Exercise) 0 0 -- 0 --   Symptoms none none none none --   Comments Results -- -- -- --   Duration -- Progress to 30 minutes of  aerobic without signs/symptoms of physical distress Continue with 30 min of aerobic exercise without signs/symptoms of physical distress. Continue with 30 min of aerobic exercise without signs/symptoms of physical distress. Continue with 30 min of aerobic exercise without signs/symptoms of physical distress.  Intensity -- THRR unchanged THRR unchanged THRR unchanged THRR unchanged     Progression   Progression -- Continue to progress workloads to maintain intensity without signs/symptoms of physical  distress. Continue to progress workloads to maintain intensity without signs/symptoms of physical distress. Continue to progress workloads to maintain intensity without signs/symptoms of physical distress. Continue to progress workloads to maintain intensity without signs/symptoms of physical distress.   Average METs -- 3.07 3.21 2.4 2.4     Resistance Training   Training Prescription -- Yes Yes Yes Yes   Weight -- 2 lb 2 lb 2 lb 2 lb   Reps -- 10-15 10-15 10-15 10-15     Interval Training   Interval Training -- No No No No     Treadmill   MPH -- 2.3 2.6 2.3 2.3   Grade -- 0.5 0 0 0   Minutes -- 15 15 15 15    METs -- 2.92 2.99 2.76 2.76     NuStep   Level -- 3  T6 2  T6 nustep -- --   Minutes -- 15 15 -- --   METs -- 2.3 2.84 -- --     REL-XR   Level -- 2 5 7 7    Minutes -- 15 15 15 15    METs -- 3.4 3.5 1.8 1.8     Home Exercise Plan   Plans to continue exercise at -- -- -- -- Home (comment)  TM, Rec Elliptical, and weight machines at home   Frequency -- -- -- -- Add 2 additional days to program exercise sessions.   Initial Home Exercises Provided -- -- -- -- 10/12/23     Oxygen   Maintain Oxygen Saturation -- 88% or higher 88% or higher 88% or higher 88% or higher    Row Name 10/21/23 0800 11/06/23 1100 11/20/23 0900 12/02/23 1000       Response to Exercise   Blood Pressure (Admit) 142/76 98/54 126/82 128/72    Blood Pressure (Exit) 158/74 104/54 122/68 138/82    Heart Rate (Admit) 129 bpm 61 bpm 78 bpm 82 bpm    Heart Rate (Exercise) 150 bpm 144 bpm 103 bpm 130 bpm    Heart Rate (Exit) 101 bpm 67 bpm 62 bpm 98 bpm    Rating of Perceived Exertion (Exercise) 13 13 13 12     Symptoms none none none none    Duration Continue with 30 min of aerobic exercise without signs/symptoms of physical distress. Continue with 30 min of aerobic exercise without signs/symptoms of physical distress. Continue with 30 min of aerobic exercise without signs/symptoms of physical distress.  Continue with 30 min of aerobic exercise without signs/symptoms of physical distress.    Intensity THRR unchanged THRR unchanged THRR unchanged THRR unchanged      Progression   Progression Continue to progress workloads to maintain intensity without signs/symptoms of physical distress. Continue to progress workloads to maintain intensity without signs/symptoms of physical distress. Continue to progress workloads to maintain intensity without signs/symptoms of physical distress. Continue to progress workloads to maintain intensity without signs/symptoms of physical distress.    Average METs 2.64 2.62 2.75 2.78      Resistance Training   Training Prescription Yes Yes Yes Yes    Weight 2 lb 2 lb 2 lb 2 lb    Reps 10-15 10-15 10-15 10-15      Interval Training   Interval Training No No No No      Treadmill   MPH 2.4 2.3  3 2.5    Grade 0 0 0 1.5    Minutes 15 15 15 15     METs 2.84 2.76 3.3 3.43      NuStep   Level 4  T6 2  T6 3  T6 nustep 2  T6    Minutes 15 15 15 15     METs 2.2 2.5 2.2 2.4      REL-XR   Level 3 3 -- 3    Minutes 15 15 -- 15      Home Exercise Plan   Plans to continue exercise at Home (comment)  TM, Rec Elliptical, and weight machines at home Home (comment)  TM, Rec Elliptical, and weight machines at home Home (comment)  TM, Rec Elliptical, and weight machines at home Home (comment)  TM, Rec Elliptical, and weight machines at home    Frequency Add 2 additional days to program exercise sessions. Add 2 additional days to program exercise sessions. Add 2 additional days to program exercise sessions. Add 2 additional days to program exercise sessions.    Initial Home Exercises Provided 10/12/23 10/12/23 10/12/23 10/12/23      Oxygen   Maintain Oxygen Saturation 88% or higher 88% or higher 88% or higher 88% or higher             Exercise Comments:   Exercise Comments     Row Name 08/10/23 4098           Exercise Comments First full day of exercise!   Patient was oriented to gym and equipment including functions, settings, policies, and procedures.  Patient's individual exercise prescription and treatment plan were reviewed.  All starting workloads were established based on the results of the 6 minute walk test done at initial orientation visit.  The plan for exercise progression was also introduced and progression will be customized based on patient's performance and goals.                Exercise Goals and Review:   Exercise Goals     Row Name 08/05/23 0912             Exercise Goals   Increase Physical Activity Yes       Intervention Provide advice, education, support and counseling about physical activity/exercise needs.;Develop an individualized exercise prescription for aerobic and resistive training based on initial evaluation findings, risk stratification, comorbidities and participant's personal goals.       Expected Outcomes Short Term: Attend rehab on a regular basis to increase amount of physical activity.;Long Term: Add in home exercise to make exercise part of routine and to increase amount of physical activity.;Long Term: Exercising regularly at least 3-5 days a week.       Increase Strength and Stamina Yes       Intervention Provide advice, education, support and counseling about physical activity/exercise needs.;Develop an individualized exercise prescription for aerobic and resistive training based on initial evaluation findings, risk stratification, comorbidities and participant's personal goals.       Expected Outcomes Short Term: Increase workloads from initial exercise prescription for resistance, speed, and METs.;Short Term: Perform resistance training exercises routinely during rehab and add in resistance training at home;Long Term: Improve cardiorespiratory fitness, muscular endurance and strength as measured by increased METs and functional capacity ( )       Able to understand and use rate of perceived  exertion (RPE) scale Yes       Intervention Provide education and explanation on how to use RPE scale  Expected Outcomes Short Term: Able to use RPE daily in rehab to express subjective intensity level;Long Term:  Able to use RPE to guide intensity level when exercising independently       Able to understand and use Dyspnea scale Yes       Intervention Provide education and explanation on how to use Dyspnea scale       Expected Outcomes Short Term: Able to use Dyspnea scale daily in rehab to express subjective sense of shortness of breath during exertion;Long Term: Able to use Dyspnea scale to guide intensity level when exercising independently       Knowledge and understanding of Target Heart Rate Range (THRR) Yes       Intervention Provide education and explanation of THRR including how the numbers were predicted and where they are located for reference       Expected Outcomes Long Term: Able to use THRR to govern intensity when exercising independently;Short Term: Able to state/look up THRR;Short Term: Able to use daily as guideline for intensity in rehab       Able to check pulse independently Yes       Intervention Provide education and demonstration on how to check pulse in carotid and radial arteries.;Review the importance of being able to check your own pulse for safety during independent exercise       Expected Outcomes Short Term: Able to explain why pulse checking is important during independent exercise;Long Term: Able to check pulse independently and accurately       Understanding of Exercise Prescription Yes       Intervention Provide education, explanation, and written materials on patient's individual exercise prescription       Expected Outcomes Short Term: Able to explain program exercise prescription;Long Term: Able to explain home exercise prescription to exercise independently                Exercise Goals Re-Evaluation :  Exercise Goals Re-Evaluation     Row Name  08/10/23 0929 08/25/23 1540 08/27/23 1209 09/11/23 0743 09/24/23 1536     Exercise Goal Re-Evaluation   Exercise Goals Review Able to understand and use rate of perceived exertion (RPE) scale;Able to understand and use Dyspnea scale;Knowledge and understanding of Target Heart Rate Range (THRR);Understanding of Exercise Prescription Increase Physical Activity;Increase Strength and Stamina;Understanding of Exercise Prescription Increase Physical Activity Increase Physical Activity;Increase Strength and Stamina;Understanding of Exercise Prescription Increase Physical Activity;Increase Strength and Stamina;Understanding of Exercise Prescription   Comments Reviewed RPE and dyspnea scale, THR and program prescription with pt today.  Pt voiced understanding and was given a copy of goals to take home. Brad Chen is off to a good start in the program. He continues to get familiar with his exercise prescription and the modalities involved. He was able to use his prescribed machines at their prescribed intensity and we will continue to monitor his progression in the program. Brad Chen wants to get back to using his TM and Rec Bike at home. He was using the equipment daily with his wife at least for 30 min.  Advised him to talk with EP about home exercise and any limitation. Brad Chen is doing well in rehab. He recently increased his speed on the treadmill to 2.6 mph with no incline. He also improved to level 5 on the XR and continues to work at level 2 on the T6 nustep. We will continue to monitor his progress in the program. Brad Chen has not attended rehab during this review. His last visit was  12/27. We will reach out in order to determine his status.   Expected Outcomes Short: Use RPE daily to regulate intensity. Long: Follow program prescription in THR. Short: Continue to follow current exercise prescription. Long: Continue exercise to improve strength and stamina. STG Speaks with EP staff about home exercise plan  LTG is exercing at  home and CR as prescribed Short: Continue to progressively increase treadmill workload. Long: Continue exercise to improve strength and stamina. Short: Return to rehab. Long: Continue exercise to improve strength and stamina.    Row Name 10/06/23 1012 10/12/23 1001 10/21/23 0816 11/06/23 1120 11/20/23 0943     Exercise Goal Re-Evaluation   Exercise Goals Review Increase Physical Activity;Increase Strength and Stamina;Understanding of Exercise Prescription Increase Physical Activity;Able to understand and use rate of perceived exertion (RPE) scale;Knowledge and understanding of Target Heart Rate Range (THRR);Understanding of Exercise Prescription;Increase Strength and Stamina;Able to understand and use Dyspnea scale;Able to check pulse independently Increase Physical Activity;Understanding of Exercise Prescription;Increase Strength and Stamina Increase Physical Activity;Understanding of Exercise Prescription;Increase Strength and Stamina Increase Physical Activity;Understanding of Exercise Prescription;Increase Strength and Stamina   Comments Brad Chen was able to return to rehab and is doing well. He was recently able to increase his level on the XR from level 5 to level 7. He also was able to use the treadmill but did decrease his speed slightly to 2.38mph. We will continue to monitor his progress in the program. Reviewed home exercise with pt today.  Pt plans to us  his TM, recumbent elliptical, and weight machines at home  for exercise.  Reviewed THR, pulse, RPE, sign and symptoms, pulse oximetery and when to call 911 or MD.  Also discussed weather considerations and indoor options.  Pt voiced understanding. Brad Chen is doing well in rehab. He was able to increase his speed to 2.4 mph on the treadmill. He also increased to level 4 on the T6 nustep. He decreased to level 3 on the XR, when previously was able to do level 7. We will continue to monitor his progress in the program. Brad Chen is doing well in rehab. He  continues to do well walking on the treadmill at a speed of 2.3 mph with no incline. He also saw a decrease from level 4 to level 2 on the T6 nustep and a decrease from level 7 to level 3 on the XR. We will continue to monitor his progress in the program. Brad Chen has only attended one session since the last review. During this session he was able to reach an increased speed on the treadmill of 3 mph with no incline. We will encourage im to attend rehab more regularly and continue to monitor his progress.   Expected Outcomes Short: Continue to follow exercise prescription. Long: Continue exercise to improve strength and stamina. Shot: add 1-2 days a week of exercise at home on off days of cardiac rehab. Long: maintain independent exercise program upon graduation from cardiac rehab. Short: Try to increase back to level 7 on the XR. Long: Continue exercise to improve strength and stamina. Short: Try to increase back to level 7 on the XR and level 4 on the T6 nustep. Long: Continue exercise to improve strength and stamina. Short: Attend rehab more consistently. Long: Continue exercise to improve strength and stamina.    Row Name 11/23/23 0454 12/02/23 1031 12/14/23 1726 12/31/23 1803       Exercise Goal Re-Evaluation   Exercise Goals Review Increase Physical Activity;Increase Strength and Stamina;Understanding of Exercise Prescription Increase  Physical Activity;Increase Strength and Stamina;Understanding of Exercise Prescription Increase Physical Activity;Increase Strength and Stamina;Understanding of Exercise Prescription Increase Physical Activity;Increase Strength and Stamina;Understanding of Exercise Prescription    Comments Brad Chen has a treadmill and a exercise bike at home, both he and his wife use them regularly. Here at rehab he is wokring on increasing his workload on treadmill. Brad Chen is doing well in rehab. He was able to increase his incline on th etreadmill from 0% to 1.5%. He was also able to maintain  his workload on the XR at level 3. We will continue to monitor his progress in the program. Brad Chen has not attended since 11/23/2023. We spoke with him on 3/28 on the phone and he was in hospital due to high BP and is on new meds and has followup with his doctor. He will call after the appointment to determine when to return to rehab. Brad Chen has not attended since 11/23/2023. We left a voicemail on his phone on 4/14 and waiting for a response to determine when he can return to rehab.    Expected Outcomes STG: increase workload here at rehab. LTG Continue to exercise to improve strength and stamina Short: Increase level on T6 nustep, increase handweights to 3lbs. Long: Continue exercise to improve strength and stamina. Short: Return to rehab when able to. Long: Continue exercise to improve strength and stamina. Short: Return to rehab when able to. Long: Continue exercise to improve strength and stamina.             Discharge Exercise Prescription (Final Exercise Prescription Changes):  Exercise Prescription Changes - 12/02/23 1000       Response to Exercise   Blood Pressure (Admit) 128/72    Blood Pressure (Exit) 138/82    Heart Rate (Admit) 82 bpm    Heart Rate (Exercise) 130 bpm    Heart Rate (Exit) 98 bpm    Rating of Perceived Exertion (Exercise) 12    Symptoms none    Duration Continue with 30 min of aerobic exercise without signs/symptoms of physical distress.    Intensity THRR unchanged      Progression   Progression Continue to progress workloads to maintain intensity without signs/symptoms of physical distress.    Average METs 2.78      Resistance Training   Training Prescription Yes    Weight 2 lb    Reps 10-15      Interval Training   Interval Training No      Treadmill   MPH 2.5    Grade 1.5    Minutes 15    METs 3.43      NuStep   Level 2   T6   Minutes 15    METs 2.4      REL-XR   Level 3    Minutes 15      Home Exercise Plan   Plans to continue exercise  at Home (comment)   TM, Rec Elliptical, and weight machines at home   Frequency Add 2 additional days to program exercise sessions.    Initial Home Exercises Provided 10/12/23      Oxygen   Maintain Oxygen Saturation 88% or higher             Nutrition:  Target Goals: Understanding of nutrition guidelines, daily intake of sodium 1500mg , cholesterol 200mg , calories 30% from fat and 7% or less from saturated fats, daily to have 5 or more servings of fruits and vegetables.  Education: All About Nutrition: -Group instruction provided by  verbal, written material, interactive activities, discussions, models, and posters to present general guidelines for heart healthy nutrition including fat, fiber, MyPlate, the role of sodium in heart healthy nutrition, utilization of the nutrition label, and utilization of this knowledge for meal planning. Follow up email sent as well. Written material given at graduation. Flowsheet Row Cardiac Rehab from 08/05/2023 in [facility] Cardiac and Pulmonary Rehab  Education need identified 08/05/23       Biometrics:  Pre Biometrics - 08/05/23 0913       Pre Biometrics   Height 5' 9.5" (1.765 m)    Weight 146 lb 11.2 oz (66.5 kg)    Waist Circumference 31 inches    Hip Circumference 35 inches    Waist to Hip Ratio 0.89 %    BMI (Calculated) 21.36    Single Leg Stand 8 seconds              Nutrition Therapy Plan and Nutrition Goals:  Nutrition Therapy & Goals - 08/27/23 1211       Nutrition Therapy   RD appointment deferred Yes    Diet deferred meeting with RD 08/26/2023             Nutrition Assessments:  MEDIFICTS Score Key: >=70 Need to make dietary changes  40-70 Heart Healthy Diet <= 40 Therapeutic Level Cholesterol Diet  Flowsheet Row Cardiac Rehab from 08/05/2023 in [facility] Cardiac and Pulmonary Rehab  Picture Your Plate Total Score on Admission 49      Picture Your Plate Scores: <16 Unhealthy dietary pattern with much room  for improvement. 41-50 Dietary pattern unlikely to meet recommendations for good health and room for improvement. 51-60 More healthful dietary pattern, with some room for improvement.  >60 Healthy dietary pattern, although there may be some specific behaviors that could be improved.    Nutrition Goals Re-Evaluation:  Nutrition Goals Re-Evaluation     Row Name 09/28/23 [phone] 11/23/23 0955           Goals   Comment deferred RD appointment Myrtie Atkinson reports he and his wife are good eaters, often choosing a lean meat with veggies and sometimes a starch. Discussed balanced plates, sodium limitations and cutting back on saturated fats.      Expected Outcome -- STG: read facts labels and monitor sodium intake. LTG: Maintain a heart healthy diet               Nutrition Goals Discharge (Final Nutrition Goals Re-Evaluation):  Nutrition Goals Re-Evaluation - 11/23/23 0955       Goals   Comment Bridge reports he and his wife are good eaters, often choosing a lean meat with veggies and sometimes a starch. Discussed balanced plates, sodium limitations and cutting back on saturated fats.    Expected Outcome STG: read facts labels and monitor sodium intake. LTG: Maintain a heart healthy diet             Psychosocial: Target Goals: Acknowledge presence or absence of significant depression and/or stress, maximize coping skills, provide positive support system. Participant is able to verbalize types and ability to use techniques and skills needed for reducing stress and depression.   Education: Stress, Anxiety, and Depression - Group verbal and visual presentation to define topics covered.  Reviews how body is impacted by stress, anxiety, and depression.  Also discusses healthy ways to reduce stress and to treat/manage anxiety and depression.  Written material given at graduation.   Education: Sleep Hygiene -Provides group verbal and written instruction about how  sleep can affect your health.   Define sleep hygiene, discuss sleep cycles and impact of sleep habits. Review good sleep hygiene tips.    Initial Review & Psychosocial Screening:  Initial Psych Review & Screening - 07/28/23 1453       Initial Review   Current issues with None Identified      Family Dynamics   Good Support System? Yes   wife     Barriers   Psychosocial barriers to participate in program There are no identifiable barriers or psychosocial needs.      Screening Interventions   Interventions Encouraged to exercise;Provide feedback about the scores to participant;To provide support and resources with identified psychosocial needs    Expected Outcomes Short Term goal: Utilizing psychosocial counselor, staff and physician to assist with identification of specific Stressors or current issues interfering with healing process. Setting desired goal for each stressor or current issue identified.;Long Term Goal: Stressors or current issues are controlled or eliminated.;Short Term goal: Identification and review with participant of any Quality of Life or Depression concerns found by scoring the questionnaire.;Long Term goal: The participant improves quality of Life and PHQ9 Scores as seen by post scores and/or verbalization of changes             Quality of Life Scores:   Quality of Life - 08/05/23 1124       Quality of Life   Select Quality of Life      Quality of Life Scores   Health/Function Pre 28.33 %    Socioeconomic Pre 30 %    Psych/Spiritual Pre 28.29 %    Family Pre 30 %    GLOBAL Pre 28.91 %            Scores of 19 and below usually indicate a poorer quality of life in these areas.  A difference of  2-3 points is a clinically meaningful difference.  A difference of 2-3 points in the total score of the Quality of Life Index has been associated with significant improvement in overall quality of life, self-image, physical symptoms, and general health in studies assessing change in quality of  life.  PHQ-9: Review Flowsheet       08/05/2023  Depression screen PHQ 2/9  Decreased Interest 0  Down, Depressed, Hopeless 0  PHQ - 2 Score 0  Altered sleeping 1  Tired, decreased energy 0  Change in appetite 0  Feeling bad or failure about yourself  0  Trouble concentrating 0  Moving slowly or fidgety/restless 0  Suicidal thoughts 0  PHQ-9 Score 1  Difficult doing work/chores Not difficult at all   Interpretation of Total Score  Total Score Depression Severity:  1-4 = Minimal depression, 5-9 = Mild depression, 10-14 = Moderate depression, 15-19 = Moderately severe depression, 20-27 = Severe depression   Psychosocial Evaluation and Intervention:  Psychosocial Evaluation - 07/28/23 1503       Psychosocial Evaluation & Interventions   Interventions Encouraged to exercise with the program and follow exercise prescription    Comments 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 He states he feels like this surgery has been easier to recover from compared to his previous surgeries. 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 He is retired from [employer]. He has a home gym and up until 6 months ago, was using it consistently. He wants to get back to a regular exercise routine. He states his  wife and his 4 dogs are his main support system.    Expected Outcomes Short: attend cardiac rehab for education and exercise. Long: Develop and maintain positive self care habits    Continue Psychosocial Services  Follow up required by staff             Psychosocial Re-Evaluation:  Psychosocial Re-Evaluation     Row Name 08/27/23 1212 09/28/23 1610 11/23/23 0957         Psychosocial Re-Evaluation   Current issues with None Identified None Identified None Identified     Comments Continues without any psychosocila concerns Patient reports no issues with their current mental states, sleep, stress, depression or anxiety. Will follow up  with patient in a few weeks for any changes. Patient reports no concerns with stress, anxiety, or depression. He says he is sleeping well. Has a good support system with his wife.     Expected Outcomes STG  attends all scheduled session exercise and education  LTG maintains without concerns Short: Continue to exercise regularly to support mental health and notify staff of any changes. Long: maintain mental health and well being through teaching of rehab or prescribed medications independently. STG: continue to use support system to stay positive. LTG: maintain a positive outlood on health and daily life     Interventions Encouraged to attend Cardiac Rehabilitation for the exercise Encouraged to attend Cardiac Rehabilitation for the exercise Encouraged to attend Cardiac Rehabilitation for the exercise     Continue Psychosocial Services  Follow up required by staff Follow up required by staff Follow up required by staff              Psychosocial Discharge (Final Psychosocial Re-Evaluation):  Psychosocial Re-Evaluation - 11/23/23 0957       Psychosocial Re-Evaluation   Current issues with None Identified    Comments Patient reports no concerns with stress, anxiety, or depression. He says he is sleeping well. Has a good support system with his wife.    Expected Outcomes STG: continue to use support system to stay positive. LTG: maintain a positive outlood on health and daily life    Interventions Encouraged to attend Cardiac Rehabilitation for the exercise    Continue Psychosocial Services  Follow up required by staff             Vocational Rehabilitation: Provide vocational rehab assistance to qualifying candidates.   Vocational Rehab Evaluation & Intervention:  Vocational Rehab - 07/28/23 1453       Initial Vocational Rehab Evaluation & Intervention   Assessment shows need for Vocational Rehabilitation No             Education: Education Goals: Education classes will be  provided on a variety of topics geared toward better understanding of heart health and risk factor modification. Participant will state understanding/return demonstration of topics presented as noted by education test scores.  Learning Barriers/Preferences:  Learning Barriers/Preferences - 07/28/23 1453       Learning Barriers/Preferences   Learning Barriers None    Learning Preferences None             General Cardiac Education Topics:  AED/CPR: - Group verbal and written instruction with the use of models to demonstrate the basic use of the AED with the basic ABC's of resuscitation.   Anatomy and Cardiac Procedures: - Group verbal and visual presentation and models provide information about basic cardiac anatomy and function. Reviews the testing methods done to diagnose heart disease and the outcomes of  the test results. Describes the treatment choices: Medical Management, Angioplasty, or Coronary Bypass Surgery for treating various heart conditions including Myocardial Infarction, Angina, Valve Disease, and Cardiac Arrhythmias.  Written material given at graduation.   Medication Safety: - Group verbal and visual instruction to review commonly prescribed medications for heart and lung disease. Reviews the medication, class of the drug, and side effects. Includes the steps to properly store meds and maintain the prescription regimen.  Written material given at graduation.   Intimacy: - Group verbal instruction through game format to discuss how heart and lung disease can affect sexual intimacy. Written material given at graduation..   Know Your Numbers and Heart Failure: - Group verbal and visual instruction to discuss disease risk factors for cardiac and pulmonary disease and treatment options.  Reviews associated critical values for Overweight/Obesity, Hypertension, Cholesterol, and Diabetes.  Discusses basics of heart failure: signs/symptoms and treatments.  Introduces Heart  Failure Zone chart for action plan for heart failure.  Written material given at graduation. Flowsheet Row Cardiac Rehab from 08/05/2023 in [facility] Cardiac and Pulmonary Rehab  Education need identified 08/05/23       Infection Prevention: - Provides verbal and written material to individual with discussion of infection control including proper hand washing and proper equipment cleaning during exercise session. Flowsheet Row Cardiac Rehab from 08/05/2023 in [facility] Cardiac and Pulmonary Rehab  Date 08/05/23  Educator NT  Instruction Review Code 1- Verbalizes Understanding       Falls Prevention: - Provides verbal and written material to individual with discussion of falls prevention and safety. Flowsheet Row Cardiac Rehab from 08/05/2023 in [facility] Cardiac and Pulmonary Rehab  Date 08/05/23  Educator NT  Instruction Review Code 1- Verbalizes Understanding       Other: -Provides group and verbal instruction on various topics (see comments)   Knowledge Questionnaire Score:  Knowledge Questionnaire Score - 08/05/23 1123       Knowledge Questionnaire Score   Pre Score 22/26             Core Components/Risk Factors/Patient Goals at Admission:  Personal Goals and Risk Factors at Admission - 07/28/23 1450       Core Components/Risk Factors/Patient Goals on Admission    Weight Management Yes;Weight Maintenance    Intervention Weight Management: Develop a combined nutrition and exercise program designed to reach desired caloric intake, while maintaining appropriate intake of nutrient and fiber, sodium and fats, and appropriate energy expenditure required for the weight goal.;Weight Management: Provide education and appropriate resources to help participant work on and attain dietary goals.    Expected Outcomes Weight Maintenance: Understanding of the daily nutrition guidelines, which includes 25-35% calories from fat, 7% or less cal from saturated fats, less than 200mg  cholesterol,  less than 1.5gm of sodium, & 5 or more servings of fruits and vegetables daily;Understanding of distribution of calorie intake throughout the day with the consumption of 4-5 meals/snacks;Understanding recommendations for meals to include 15-35% energy as protein, 25-35% energy from fat, 35-60% energy from carbohydrates, less than 200mg  of dietary cholesterol, 20-35 gm of total fiber daily    Hypertension Yes    Intervention Provide education on lifestyle modifcations including regular physical activity/exercise, weight management, moderate sodium restriction and increased consumption of fresh fruit, vegetables, and low fat dairy, alcohol moderation, and smoking cessation.;Monitor prescription use compliance.    Expected Outcomes Short Term: Continued assessment and intervention until BP is < 140/48mm HG in hypertensive participants. < 130/78mm HG in hypertensive participants with diabetes,  heart failure or chronic kidney disease.;Long Term: Maintenance of blood pressure at goal levels.    Lipids Yes    Intervention Provide education and support for participant on nutrition & aerobic/resistive exercise along with prescribed medications to achieve LDL 70mg , HDL >40mg .    Expected Outcomes Short Term: Participant states understanding of desired cholesterol values and is compliant with medications prescribed. Participant is following exercise prescription and nutrition guidelines.;Long Term: Cholesterol controlled with medications as prescribed, with individualized exercise RX and with personalized nutrition plan. Value goals: LDL < 70mg , HDL > 40 mg.             Education:Diabetes - Individual verbal and written instruction to review signs/symptoms of diabetes, desired ranges of glucose level fasting, after meals and with exercise. Acknowledge that pre and post exercise glucose checks will be done for 3 sessions at entry of program.   Core Components/Risk Factors/Patient Goals Review:   Goals and  Risk Factor Review     Row Name 08/27/23 1213 09/28/23 2130 11/23/23 0953         Core Components/Risk Factors/Patient Goals Review   Personal Goals Review Weight Management/Obesity;Lipids Hypertension Hypertension     Review Brad Chen has lost weight and wants to gain back to 190lB, he is at 146 lb today. He was required muscle mass for work and is trying to gain this back.  He is compliant with his meds for the lipid control and does monitor intake of fats and is exercising to help with risk factor control. Brad Chen has been doing well in the program. He has been able to check his blood pressure at home. It has ben within normal ranges like 120s to 130 diastolic. He has no questions avbout his medications at this time. He has been able to increase his work loads in class and will continue to do so. Brad Chen reports he has a blood pressure cuff at home and checks his BP regularly and it has been well controlled within normal levels. Encouraged him to continue to check BP and increase workload here at rehab as able     Expected Outcomes STG   Continue with exercise prescrotion as tolerated, monitor fat intake for lipid control. maintain compliance with meds  LTG Maintiang healthy lifestyle after discharge Short: increase work loads and check blood pressure at home. Long: maintain BP checks and exercise independently STG: increase workload here at rehab and control BP. LTG: maintain BP checks and exercise independently              Core Components/Risk Factors/Patient Goals at Discharge (Final Review):   Goals and Risk Factor Review - 11/23/23 0953       Core Components/Risk Factors/Patient Goals Review   Personal Goals Review Hypertension    Review Brad Chen reports he has a blood pressure cuff at home and checks his BP regularly and it has been well controlled within normal levels. Encouraged him to continue to check BP and increase workload here at rehab as able    Expected Outcomes STG: increase  workload here at rehab and control BP. LTG: maintain BP checks and exercise independently             ITP Comments:  ITP Comments     Row Name 07/28/23 1501 08/05/23 0909 08/10/23 0929 09/02/23 0959 09/30/23 1307   ITP Comments Initial phone call completed. Diagnosis can be found in [facility] 10/20. EP Orientation scheduled for Thursday 11/14 at 9am. Completed and gym orientation. Initial ITP created and  sent for review to Dr. Firman Hughes, Medical Director. First full day of exercise!  Patient was oriented to gym and equipment including functions, settings, policies, and procedures.  Patient's individual exercise prescription and treatment plan were reviewed.  All starting workloads were established based on the results of the 6 minute walk test done at initial orientation visit.  The plan for exercise progression was also introduced and progression will be customized based on patient's performance and goals. 30 Day review completed. Medical Director ITP review done, changes made as directed, and signed approval by Medical Director.    new to program 30 Day review completed. Medical Director ITP review done, changes made as directed, and signed approval by Medical Director.    Row Name 10/28/23 1124 11/25/23 1022 12/23/23 0816 01/13/24 1132     ITP Comments 30 Day review completed. Medical Director ITP review done, changes made as directed, and signed approval by Medical Director. 30 Day review completed. Medical Director ITP review done, changes made as directed, and signed approval by Medical Director. 30 Day review completed. Medical Director ITP review done, changes made as directed, and signed approval by Medical Director.   absent last visit 3/10 DIscharged per patient request             Comments: Early Discharge   Patient request

## 2024-01-13 NOTE — Progress Notes (Signed)
 Discharge Note for  Brad Chen     19-Feb-1961        DIscharged after 26 sessions.  Patient last visit 11/23/2023.       6 Minute Walk     Row Name 08/05/23 0910         6 Minute Walk   Phase Initial     Distance 1330 feet     Walk Time 6 minutes     # of Rest Breaks 0     MPH 2.52     METS 4.04     RPE 6     Perceived Dyspnea  0     VO2 Peak 14.14     Symptoms No     Resting HR 62 bpm     Resting BP 152/74     Resting Oxygen Saturation  100 %     Exercise Oxygen Saturation  during 6 min walk 99 %     Max Ex. HR 103 bpm     Max Ex. BP 164/76     2 Minute Post BP 158/76

## 2024-01-22 ENCOUNTER — Telehealth (INDEPENDENT_AMBULATORY_CARE_PROVIDER_SITE_OTHER): Payer: Self-pay

## 2024-01-22 NOTE — Telephone Encounter (Signed)
 That's fine he can come in with carotids and ABIs

## 2024-02-02 ENCOUNTER — Encounter (INDEPENDENT_AMBULATORY_CARE_PROVIDER_SITE_OTHER): Payer: Self-pay

## 2024-03-08 ENCOUNTER — Telehealth (INDEPENDENT_AMBULATORY_CARE_PROVIDER_SITE_OTHER): Payer: Self-pay | Admitting: Vascular Surgery

## 2024-03-08 NOTE — Telephone Encounter (Signed)
 Patient LVM on [facility] stating he needed appt. Called pt back at [phone]. No answer and no voicemail to leave message. LVM on alternate phone number listed on pt chart.

## 2024-03-29 ENCOUNTER — Ambulatory Visit (INDEPENDENT_AMBULATORY_CARE_PROVIDER_SITE_OTHER): Admitting: Vascular Surgery

## 2024-04-12 ENCOUNTER — Encounter (INDEPENDENT_AMBULATORY_CARE_PROVIDER_SITE_OTHER): Payer: Self-pay | Admitting: Vascular Surgery

## 2024-04-12 ENCOUNTER — Ambulatory Visit (INDEPENDENT_AMBULATORY_CARE_PROVIDER_SITE_OTHER): Admitting: Vascular Surgery

## 2024-04-12 VITALS — BP 116/63 | HR 61 | Ht 67.0 in | Wt 139.5 lb

## 2024-04-12 DIAGNOSIS — E785 Hyperlipidemia, unspecified: Secondary | ICD-10-CM

## 2024-04-12 DIAGNOSIS — I6521 Occlusion and stenosis of right carotid artery: Secondary | ICD-10-CM

## 2024-04-12 DIAGNOSIS — I771 Stricture of artery: Secondary | ICD-10-CM

## 2024-04-12 DIAGNOSIS — I70213 Atherosclerosis of native arteries of extremities with intermittent claudication, bilateral legs: Secondary | ICD-10-CM | POA: Diagnosis not present

## 2024-04-12 DIAGNOSIS — I10 Essential (primary) hypertension: Secondary | ICD-10-CM

## 2024-04-12 DIAGNOSIS — I70219 Atherosclerosis of native arteries of extremities with intermittent claudication, unspecified extremity: Secondary | ICD-10-CM | POA: Insufficient documentation

## 2024-04-12 NOTE — Progress Notes (Signed)
 MRN : [id number]  Brad Chen is a 63 y.o. ([birth year]) male who presents with chief complaint of  Chief Complaint  Patient presents with   fu.consult. PVD-ABI done 6.12.25 at [facility]  .  History of Present Illness: Patient returns today in follow up on a new referral from his cardiologist for peripheral arterial disease.  He complained some leg pain and ABIs were done at the cardiologist office showing a right ABI of 1.0 and a left ABI of 0.7.  He has an extensive atherosclerotic vascular history.  He is already status post extensive left subclavian artery revascularization at an endovascular fashion for gangrene of the left hand.  He also has undergone right carotid endarterectomy for significant stenosis.  He has done well.  We have not seen him since his immediate postoperative visit after carotid endarterectomy last fall.  He denies any focal neurologic symptoms.  No current left hand pain or other issues.  He does have some pain in his legs with walking.  He has been trying to increase his walking to help with that.  He has no open wounds or infection.  No rest pain.  Current Outpatient Medications  Medication Sig Dispense Refill   atorvastatin  (LIPITOR) 40 MG tablet Take 40 mg by mouth daily.     clopidogrel  (PLAVIX ) 75 MG tablet Take 75 mg by mouth daily.     ezetimibe (ZETIA) 10 MG tablet Take 10 mg by mouth daily.     losartan (COZAAR) 25 MG tablet Take 1 tablet by mouth daily.     metoprolol  succinate (TOPROL -XL) 25 MG 24 hr tablet Take 25 mg by mouth daily.     naltrexone (DEPADE) 50 MG tablet Take 1 tablet by mouth daily.     No current facility-administered medications for this visit.    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   a.) s/p partial pharyngectomy, small FOM lesion resection, biodesign placement 11/25/2022 --> pathology (+) for 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 (pT1); p16 and HPV (-)   Status post carpal tunnel release of both wrists    Subclavian artery stenosis, left (HCC) 01/2023   a.) s/p PTA and stenting 02/13/2023--> tandem proximal LEFT subclavian and LEFT axillary artery stents   TIA (transient ischemic attack) 2018   Torus mandibularis    a.) s/p excision 11/25/2022    Past Surgical History:  Procedure Laterality Date   ANTERIOR CERVICAL DECOMP/DISCECTOMY FUSION N/A 2003   Procedure: ANTERIOR CERVICAL DECOMP/DISCECTOMY FUSION (C5-C6)   CARPAL TUNNEL RELEASE Bilateral    ENDARTERECTOMY Right 05/07/2023   Procedure: ENDARTERECTOMY CAROTID;  Surgeon: Marea Selinda RAMAN, MD;   Location: [facility];  Service: Vascular;  Laterality: Right;   LEFT HEART CATH AND CORONARY ANGIOGRAPHY Left 06/18/2023   Procedure: LEFT HEART CATH AND CORONARY ANGIOGRAPHY;  Surgeon: Florencio Cara BIRCH, MD;  Location: [facility];  Service: Cardiovascular;  Laterality: Left;   PHARYNGECTOMY  11/25/2022   Procedure(s): PR PARTIAL REMOVAL OF PHARYNX  PR REMOVAL NODES, NECK,CERV MOD RAD  PR EXCIS MOUTH MUCOSA/SUB,NO REPAIR  LTD PHARYNGECTOMY  CERVICAL LYMPHADENECTOMY (MODIFIED RADICAL NECK DISSECTION)  EXCISION OF LESION OF MUCOSA AND SUBMUCOSA, VESTIBULE OF MOUTH; WITHOUT REPAIR   SHOULDER ARTHROSCOPY WITH SUBACROMIAL DECOMPRESSION AND OPEN ROTATOR C Left 07/22/2022   Procedure: Left shoulder arthroscopic subscapularis repair, mini open rotator cuff repair vs reconstruction with allograft, subacromial decompression, distal clavicle excision, and biceps tenodesis;  Surgeon: Tobie Priest, MD;  Location: [facility];  Service: Orthopedics;  Laterality: Left;   UPPER EXTREMITY ANGIOGRAPHY Left 02/13/2023   Procedure: Upper Extremity Angiography;  Surgeon: Marea Selinda RAMAN, MD;  Location: [facility];  Service: Cardiovascular;  Laterality: Left;     Social History   Tobacco Use   Smoking status: Former    Average packs/day: 1 pack/day for 33.0 years (33.0 ttl pk-yrs)    Types: Cigarettes    Start date: 10   Smokeless tobacco: Never  Vaping Use   Vaping status: Never Used  Substance Use Topics   Alcohol use: Not Currently    Comment: stopped 12-2022 when dx with head/neck cancer   Drug use: Never      Family History  Problem Relation Age of Onset   Hypertension Mother      No Known Allergies   REVIEW OF SYSTEMS (Negative unless checked)  Constitutional: [] Weight loss  [] Fever  [] Chills Cardiac: [] Chest pain   [] Chest pressure   [] Palpitations   [] Shortness of breath when laying flat   [] Shortness of breath at rest   [] Shortness of breath with exertion. Vascular:   [x] Pain in legs with walking   [] Pain in legs at rest   [] Pain in legs when laying flat   [x] Claudication   [] Pain in feet when walking  [] Pain in feet at rest  [] Pain in feet when laying flat   [] History of DVT   [] Phlebitis   [] Swelling in legs   [] Varicose veins   [] Non-healing ulcers Pulmonary:   [] Uses home oxygen   [] Productive cough   [] Hemoptysis   [] Wheeze  [] COPD   [] Asthma Neurologic:  [] Dizziness  [] Blackouts   [] Seizures   [] History of stroke   [x] History of TIA  [] Aphasia   [] Temporary blindness   [] Dysphagia   [] Weakness or numbness in arms   [] Weakness or numbness in legs Musculoskeletal:  [] Arthritis   [] Joint swelling   [x] Joint pain   [] Low back pain Hematologic:  [] Easy bruising  [] Easy bleeding   [] Hypercoagulable state   []   Anemic   Gastrointestinal:  [] Blood in stool   [] Vomiting blood  [] Gastroesophageal reflux/heartburn   [] Abdominal pain Genitourinary:  [] Chronic kidney disease   [] Difficult urination  [] Frequent urination  [] Burning with urination   [] Hematuria Skin:  [] Rashes   [] Ulcers   [] Wounds Psychological:  [] History of anxiety   []  History of major depression.  Physical Examination  BP 116/63   Pulse 61   Ht 5' 7 (1.702 m)   Wt 139 lb 8 oz (63.3 kg)   BMI 21.85 kg/m  Gen:  WD/WN, NAD Head: [place]/AT, No temporalis wasting. Ear/Nose/Throat: Hearing grossly intact, nares w/o erythema or drainage Eyes: Conjunctiva clear. Sclera non-icteric Neck: Supple.  Trachea midline Pulmonary:  Good air movement, no use of accessory muscles.  Cardiac: RRR, no JVD Vascular:  Vessel Right Left  Radial Palpable Palpable                          PT 1+ Palpable 1+ Palpable  DP 2+ Palpable 1+ Palpable   Gastrointestinal: soft, non-tender/non-distended. No guarding/reflex.  Musculoskeletal: M/S 5/5 throughout.  No deformity or atrophy. No edema. Neurologic: Sensation grossly intact in extremities.  Symmetrical.  Speech is fluent.  Psychiatric: Judgment intact, Mood &  affect appropriate for pt's clinical situation. Dermatologic: No rashes or ulcers noted.  No cellulitis or open wounds.      Labs No results found for this or any previous visit (from the past 2160 hours).  Radiology No results found.  Assessment/Plan  Carotid stenosis Has had previous carotid endarterectomy.  Has not had a recent carotid duplex.  Will get this done with his follow-up visit for PAD in the coming months.  We also will likely be able to gauge some degree of determination on his subclavian artery disease that we have previously treated on the study.  Subclavian artery stenosis, left (HCC) Previously treated and seems to be doing well now.  Carotid duplex showed some light on the patency of the stents as well.  Hypertension blood pressure control important in reducing the progression of atherosclerotic disease. On appropriate oral medications.   Atherosclerosis of native arteries of extremity with intermittent claudication (HCC) ABIs done by his cardiologist last month and then reviewed.  Right ABI is 1.  Left ABI is 0.7.  He does have some degree of claudication symptoms a little worse on the left than the right.  He does not have any lifestyle limitations at current.  He does not have any ischemic rest pain or ulceration.  I recommended he continue walking is much as possible to build collaterals.  I recommended he continue the Plavix  and Lipitor.  We are going to reassess him in about 3 months with noninvasive studies and determine further treatment options.  Hyperlipidemia lipid control important in reducing the progression of atherosclerotic disease. Continue statin therapy    Selinda Gu, MD  04/12/2024 6:18 PM    This note was created with Dragon medical transcription system.  Any errors from dictation are purely unintentional

## 2024-04-12 NOTE — Assessment & Plan Note (Signed)
 Previously treated and seems to be doing well now.  Carotid duplex showed some light on the patency of the stents as well.

## 2024-04-12 NOTE — Assessment & Plan Note (Signed)
 lipid control important in reducing the progression of atherosclerotic disease. Continue statin therapy

## 2024-04-12 NOTE — Assessment & Plan Note (Signed)
 Has had previous carotid endarterectomy.  Has not had a recent carotid duplex.  Will get this done with his follow-up visit for PAD in the coming months.  We also will likely be able to gauge some degree of determination on his subclavian artery disease that we have previously treated on the study.

## 2024-04-12 NOTE — Assessment & Plan Note (Signed)
 ABIs done by his cardiologist last month and then reviewed.  Right ABI is 1.  Left ABI is 0.7.  He does have some degree of claudication symptoms a little worse on the left than the right.  He does not have any lifestyle limitations at current.  He does not have any ischemic rest pain or ulceration.  I recommended he continue walking is much as possible to build collaterals.  I recommended he continue the Plavix  and Lipitor.  We are going to reassess him in about 3 months with noninvasive studies and determine further treatment options.

## 2024-04-12 NOTE — Assessment & Plan Note (Signed)
 blood pressure control important in reducing the progression of atherosclerotic disease. On appropriate oral medications.

## 2024-07-12 ENCOUNTER — Encounter (INDEPENDENT_AMBULATORY_CARE_PROVIDER_SITE_OTHER)

## 2024-07-12 ENCOUNTER — Ambulatory Visit (INDEPENDENT_AMBULATORY_CARE_PROVIDER_SITE_OTHER): Admitting: Vascular Surgery

## 2024-07-26 ENCOUNTER — Encounter (INDEPENDENT_AMBULATORY_CARE_PROVIDER_SITE_OTHER)

## 2024-07-26 ENCOUNTER — Ambulatory Visit (INDEPENDENT_AMBULATORY_CARE_PROVIDER_SITE_OTHER): Admitting: Vascular Surgery

## 2024-08-08 NOTE — ED Provider Notes (Signed)
 [facility] Emergency Department Provider Note  HPI & Impression   Final diagnoses:  Encephalopathy, unspecified type (Primary)   History of Present Illness Brad Chen is a 63 year old male who presents with fatigue and confusion. He is accompanied by his wife.  He experiences significant fatigue and confusion, with episodes of disorientation, such as being in a shop and then suddenly finding himself in the hospital. He fell at home on Saturday, 08/06/2024, resulting in a laceration on the back of his head with significant bleeding. His wife noticed blood on the wall and called emergency services. He underwent a blood transfusion on Friday due to a hemoglobin level of seven, but there was no significant improvement. His wife observed twitching episodes, initially at the sink while taking medications and using his feeding tube, and persisting during a car ride where he appeared to fall asleep and continued to twitch. His condition has not improved since last week, and he has experienced additional bleeding.  MDM   Vitals: BP 113/52   Pulse 55   Temp 37.2 C (99 F) (Oral)   Resp 11   SpO2 100%    Medical Decision Making A 63 year old male with a history of cancer under active management presented to the Emergency Department with fatigue, confusion, and twitching. He had a recent fall resulting in a scalp laceration with significant bleeding and received a blood transfusion for anemia last week. On exam, he was oriented to person but confused about time and place, with intact motor function. He is not on anticoagulation and has a feeding tube.  Differential diagnosis includes, but is not limited to: - Toxic/Metabolic Encephalopathy: Considered due to altered mental status, fatigue, and history of anemia and cancer, with possible contributions from dehydration or electrolyte abnormalities. - Intracranial Hemorrhage: Considered due to recent fall with scalp laceration and confusion,  with CT imaging planned to rule out intracranial injury. - Infection (Urinary Tract Infection or Pneumonia): Considered as a possible contributor to altered mental status and fatigue, with chest x-ray and blood cultures planned.  Fatigue, Altered Mental Status, and Recent Fall Combined presentation of fatigue, confusion, twitching, and recent fall with scalp laceration. - Order broad laboratory workup including blood cultures - Order CT head and cervical spine  Suspected Infection Possible urinary tract infection or pneumonia contributing to current symptoms. - Order chest x-ray  Diagnostics: Orders Placed This Encounter  Procedures  . Blood Culture #1  . Blood Culture #2  . Lower Respiratory Culture  . Respiratory Pathogen Panel  . MRSA Screen  . CT Head Wo Contrast  . CT cervical spine WO contrast (Non-Trauma)  . XR Chest 1 view Portable  . CBC w/ Differential  . Comprehensive Metabolic Panel  . Toxicology Screen, Urine  . Urinalysis with Microscopy with Culture Reflex (Clean Catch)  . Ethanol  . TSH  . Lipase  . Magnesium   . PT-INR  . T4, free  . Lactic Acid, Venous, Whole Blood  . Basic metabolic panel  . CBC  . Monitor  . Sepsis Timer Started  . Vital signs  . Patient may shower  . Vital signs  . Notify Provider  . Weigh patient  . Incentive Spirometry  . Flush line per protocol  . Remote Tech Telemetry Monitoring  . May Be Off Telemetry to [employer]  . May Be Off Telemetry for Tests  . May Be Off Telemetry for Transport  . Full Code  . ECG 12 Lead  . Insert peripheral IV  .  Insert peripheral IV  . Saline lock IV  . Place Patient in Bed  . ED Admit Decision    ED Course as of 08/08/24 2257  Mon Aug 08, 2024  1552 TSH(!): 0.399  2257 Patient pending admission for encephalopathy likely related to pneumonia    The case was discussed with the attending physician who is in agreement with the above assessment and plan.  Additional MDM Elements      Discussion with other professionals: See MDM above Independent interpretation: See MDM above          Physical Exam   Vitals:   08/08/24 1830 08/08/24 2000 08/08/24 2030 08/08/24 2230  BP: 105/64 147/77 133/56 113/52  Pulse: (!) 49 82 57 55  Resp:      Temp:      TempSrc:      SpO2: 97% 99% 99% 100%     Physical Exam CONSTITUTIONAL: Chronically ill-appearing, no acute distress HEENT: Normocephalic and atraumatic. Conjunctivae are normal. EOMI. Mucous membranes are dry. NECK: Full active ROM. CARDIOVASCULAR: No murmurs.  Extremities are warm and well-perfused. RESPIRATORY: Lungs CTA bilaterally.  Speaking easily in full sentences without audible stridor or wheezing. Equal chest rise without increased work of breathing. GASTROINTESTINAL: Abdomen is soft, non-distended, non-tender, G-tube site is clean dry and intact. No rebound or guarding. MUSCULOSKELETAL: No long bone deformities. No lower extremity edema. NEUROLOGIC: Alert, oriented to person place and time.  No facial asymmetry. Normal speech and language. Moving all extremities symmetrically, spontaneously. Motor strength intact in upper and lower extremities.  Intermittent generalized twitching during exam.  No reproducible clonus. SKIN: Skin is warm, dry.  Superficial skin tears to bilateral upper extremities, hemostatic.   Past History   PAST MEDICAL HISTORY/PAST SURGICAL HISTORY:  Past Medical History[1]  Past Surgical History[2]  MEDICATIONS:   Current Facility-Administered Medications:  .  [START ON 08/09/2024] atorvastatin  (LIPITOR) tablet 40 mg, 40 mg, Enteral tube: gastric, Daily, Burnard Hassan ORN, MD .  NOREEN ON 08/09/2024] azithromycin (ZITHROMAX) tablet 500 mg, 500 mg, Enteral tube: gastric, Q24H SCH, Burnard Hassan ORN, MD .  LORINA cefepime  (MAXIPIME ) 2 g in sodium chloride  0.9 % (NS) 100 mL IVPB-MBP, 2 g, Intravenous, Once, Stopped at 08/08/24 1906 **AND** [START ON 08/09/2024] cefepime  (MAXIPIME ) 2 g  in sodium chloride  0.9 % (NS) 100 mL IVPB-MBP, 2 g, Intravenous, Q12H, Burnard Hassan ORN, MD .  NOREEN ON 08/09/2024] enoxaparin  (LOVENOX ) syringe 40 mg, 40 mg, Subcutaneous, Q24H, Burnard Hassan ORN, MD .  NOREEN ON 08/09/2024] ezetimibe (ZETIA) tablet 10 mg, 10 mg, Enteral tube: gastric, Daily, Burnard Hassan ORN, MD .  NOREEN ON 08/09/2024] gabapentin (NEURONTIN) oral solution, 250 mg, Enteral tube: gastric, BID, Kelly, Gavin W, MD .  melatonin tablet 3 mg, 3 mg, Oral, Nightly PRN, Burnard Hassan ORN, MD .  NOREEN ON 08/09/2024] nicotine  (NICODERM CQ ) 14 mg/24 hr patch 1 patch, 1 patch, Transdermal, Daily, Burnard Hassan ORN, MD .  oxyCODONE  (ROXICODONE ) 5 mg/5 mL solution 15 mg, 15 mg, Enteral tube: gastric, Q4H PRN **OR** oxyCODONE  (ROXICODONE ) 5 mg/5 mL solution 10 mg, 10 mg, Enteral tube: gastric, Q4H PRN, Burnard Hassan ORN, MD .  NOREEN ON 08/09/2024] pantoprazole  (Protonix ) oral suspension, 40 mg, Enteral tube: gastric, Daily, Burnard Hassan ORN, MD .  NOREEN ON 08/09/2024] sennosides (SENOKOT) oral syrup, 5 mL, Enteral tube: gastric, BID, Burnard Hassan ORN, MD  Current Outpatient Medications:  .  alum-mag-simeth (MAALOX PLUS) 200-200-20 mg/5 mL Susp, Mix equal parts diphenhydramine , lidocaine , and liquid antacid. Swish  5 to in mouth for 60 seconds, every 4 to 6 hours as needed, then swallow or spit mixture out (as directed by prescriber). Shake well before using. Refrigerate after mixing and discard after 14 days. (Patient not taking: Reported on 08/04/2024), Disp: 355 mL, Rfl: 3 .  atorvastatin  (LIPITOR) 40 MG tablet, 1 tablet (40 mg total) by G-tube route daily., Disp: , Rfl:  .  clopidogrel  (PLAVIX ) 75 mg tablet, 1 tablet (75 mg total) by G-tube route in the morning., Disp: 90 tablet, Rfl: 0 .  diphenhydrAMINE  (BENADRYL ) 12.5 mg/5 mL liquid, Mix equal parts diphenhydramine , lidocaine , and liquid antacid. Swish 5 to 10mLs in mouth for 60 seconds, every 4 to 6 hours as needed, then swallow or spit mixture out (as  directed by prescriber). Shake well before using. Refrigerate after mixing and discard after 14 days. (Patient not taking: Reported on 08/04/2024), Disp: 118 mL, Rfl: 3 .  ezetimibe (ZETIA) 10 mg tablet, 1 tablet (10 mg total) by G-tube route daily., Disp: 90 tablet, Rfl: 0 .  folic acid  (FOLVITE ) 400 MCG tablet, 1 tablet (400 mcg total) by G-tube route in the morning., Disp: , Rfl:  .  gabapentin (NEURONTIN) 250 mg/5 mL oral solution, Start with 300 mg (6 mL) by mouth at bedtime. Can increase to 300 mg (6 mL) twice a day if needed., Disp: 240 mL, Rfl: 2 .  lidocaine  (XYLOCAINE ) 2 % Soln, Mix equal parts diphenhydramine , lidocaine , and liquid antacid. Swish 5 to 10mLs in mouth for 60 seconds, every 4 to 6 hours as needed, then swallow or spit mixture out (as directed by prescriber). Shake well before using. Refrigerate after mixing and discard after 14 days., Disp: 100 mL, Rfl: 3 .  losartan (COZAAR) 25 MG tablet, 1 tablet (25 mg total) by G-tube route daily., Disp: , Rfl:  .  [Paused] metoPROLOL  succinate (TOPROL -XL) 25 MG 24 hr tablet, Take 1 tablet (25 mg total) by mouth daily. (Patient not taking: Reported on 07/28/2024), Disp: , Rfl:  .  metoPROLOL  tartrate (LOPRESSOR ) solution 10 mg/mL, 1.25 mL (12.5 mg total) by Enteral tube: gastric route two (2) times a day., Disp: 75 mL, Rfl: 1 .  multivitamin, therapeutic with minerals (CENTRUM) 9 mg/15 mL iron Liqd, 15 mL by Enteral tube: gastric route daily., Disp: 450 mL, Rfl: 0 .  mupirocin (BACTROBAN) 2 % ointment, Apply topically Three (3) times a day for 7 days., Disp: 60 g, Rfl: 1 .  nicotine  (NICODERM CQ ) 14 mg/24 hr patch, Place 1 patch on the skin daily. Remove old patch before applying new one., Disp: 28 patch, Rfl: 0 .  ondansetron  (ZOFRAN ) 8 MG tablet, 1 tablet (8 mg total) by G-tube route every eight (8) hours as needed for nausea (or vomiting)., Disp: , Rfl:  .  oxyCODONE  (ROXICODONE ) 5 mg/5 mL solution, Take 10-15 mL (10-15 mg total) by mouth  every four (4) hours as needed for pain, Disp: 1000 mL, Rfl: 0 .  pantoprazole  (Protonix ) 2 mg/mL oral suspension, 20 mL (40 mg total) by Enteral tube: gastric route two (2) times a day for 14 days., Disp: 560 mL, Rfl: 0 .  prochlorperazine (COMPAZINE) 10 MG tablet, 1 tablet (10 mg total) by G-tube route every six (6) hours as needed (Nausea/Vomiting)., Disp: , Rfl:  .  sennosides 8.8 mg/5 mL Syrp, Take 5 mL (8.8 mg total) by mouth two (2) times a day., Disp: 237 mL, Rfl: 2 .  thiamine  (B-1) 100 MG tablet, 2 tablets (200 mg  total) by G-tube route three (3) times a day for 2 days., Disp: 100 tablet, Rfl: 0  ALLERGIES:  Patient has no known allergies.  SOCIAL HISTORY:  Social History   Tobacco Use  . Smoking status: Former    Current packs/day: 1.00    Average packs/day: 1 pack/day for 41.9 years (41.9 ttl pk-yrs)    Types: Cigarettes    Start date: 71  . Smokeless tobacco: Former    Quit date: 02/2022  . 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 Alcohol use: Not Currently    Comment: Some days    FAMILY HISTORY: Family History[3]    Radiology   XR Chest 1 view Portable  Final Result    Left retrocardiac opacity suspicious for space disease. Given clinical history of throat cancer, considerations include infection, aspiration, and neoplastic process.        CT Head Wo Contrast  Final Result  No acute intracranial hemorrhage or depressed skull fracture.        CT cervical spine WO contrast (Non-Trauma)  Final Result    *  No acute traumatic abnormality.  *  Degenerative changes as detailed in the body the report.       Laboratory Data   Lab Results  Component Value Date   WBC 2.9 (L) 08/08/2024   HGB 9.5 (L) 08/08/2024   HCT 27.1 (L) 08/08/2024   PLT 260 08/08/2024    Lab Results  Component Value Date   NA 139 08/08/2024   K 4.5 08/08/2024   CL 96 (L)  08/08/2024   CO2 36.0 (H) 08/08/2024   BUN 13 08/08/2024   CREATININE 1.42 (H) 08/08/2024   GLU 99 08/08/2024   CALCIUM  9.0 08/08/2024   MG 1.7 08/08/2024   PHOS 4.2 07/26/2024    Lab Results  Component Value Date   BILITOT 0.4 08/08/2024   PROT 6.2 08/08/2024   ALBUMIN 2.7 (L) 08/08/2024   ALT 35 08/08/2024   AST 40 (H) 08/08/2024   ALKPHOS 70 08/08/2024    Lab Results  Component Value Date   INR 1.04 08/08/2024   APTT 22.2 (L) 07/21/2024    Bernardino Able, DO [employer]   Emergency Medicine, PGY3  Portions of this record have been created using Dragon dictation software. Dictation errors have been sought, but may not have been identified and corrected.        [1] Past Medical History: Diagnosis Date  . Alcohol withdrawal syndrome without complication    (CMS-HCC) 11/30/2023  . Carotid stenosis, right 04/15/2023  . Gangrene of finger of left hand    (CMS-HCC) 02/03/2023  . HL (hearing loss)   . Hypertension   . 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 NECK DISSECTION  2003  . PR EXCIS MOUTH MUCOSA/SUB,NO REPAIR N/A 11/25/2022   Procedure: EXCISION OF LESION OF MUCOSA AND SUBMUCOSA, VESTIBULE OF MOUTH; WITHOUT REPAIR;  Surgeon: Denys Reyes Lunger, MD;  Location: MAIN OR [facility];  Service: ENT  . PR EXCISION,TORUS MANDIBULARIS Right 11/25/2022   Procedure: EXC TORUS MANDIBULARIS;  Surgeon: Denys Reyes Lunger, MD;  Location: MAIN OR [facility];  Service: ENT  . PR LARYNGOSCOPY,DIRCT,OP,BIOPSY Left 05/26/2024   Procedure: LARYNGOSCOPY, DIRECT, OPERATIVE, WITH BIOPSY;  Surgeon: Denys Reyes Lunger, MD;  Location: OR [facility];  Service: ENT  .  PR PARTIAL REMOVAL OF PHARYNX N/A 11/25/2022   Procedure: LTD PHARYNGECTOMY;  Surgeon: Denys Reyes Lunger, MD;  Location: MAIN OR [facility];  Service: ENT  . PR PLACE PERCUT GASTROSTOMY TUBE N/A 07/22/2024   Procedure:  ESOPHAGOGASTRODUODENOSCOPY, FLEXIBLE, TRANSORAL; WITH DIRECTED PLACEMENT OF PERCUTANEOUS GASTROSTOMY TUBE;  Surgeon: Mariann Norleen Mt, MD;  [facility];  Service: Gastroenterology  . ROTATOR CUFF REPAIR Left 07/2022  [3] Family History Problem Relation Age of Onset  . Hypertension Mother   . Hypertension Sister   . Anesthesia problems Neg Hx   . Bleeding Disorder Neg Hx    Yvonne Bernardino PARAS, DO Resident 08/08/24 [phone]

## 2024-08-09 NOTE — Progress Notes (Signed)
 [facility] Central Navigation Program    Patient is due for a pro-active outreach visit with our Central Navigation team to review non-clinical resource needs, however patient is hospitalized and unavailable today. Oncology Patient Navigator (OPN) will follow up upon discharge.     Please message our program through In Basket [facility] Oncology Navigation) if additional support needs are identified at this time.

## 2024-08-11 NOTE — Progress Notes (Signed)
 ------------------------------------------------------------------------------- Attestation signed by Tiajuana Sages, MD at 08/12/24 [phone] I saw and examined patient with Dr. Mallie Pong, Intern in Medicine, and agree with her assessment and plan. -------------------------------------------------------------------------------  Oncology (MEDO) Progress Note  Assessment & Plan:  Brad Chen is a 63 y.o. male who is presenting to [facility] with Pneumonia, in the setting of the following pertinent/contributing co-morbidities:  SCC of R oropharynx (stage III, hypopharyngeal), CKD, HTN, CAD s/p CABG, HLD, nicotine  use presenting with somnolence and altered mental status.  Principal Problem:   Pneumonia Active Problems:   Cancer of hypopharynx (CMS-HCC)   Coronary artery disease   Hyperlipidemia   Hypertension   S/P coronary artery bypass graft x 3   Active Problems  Community acquired pneumonia  Hypoxic respiratory failure Usually has productive cough of white sputum due to ongoing radiation therapy, but quality of sputum is now mucopurulent.  Chest x-ray on admission with a left retrocardiac opacity.  No reported fever, chills.  Mildly neutropenic with ANC 1.6.  Initially hypoxic in ED requiring 6 L nasal cannula, weaned to 1 L on admission and shortly after to room air. Dyspnea and productive cough much improved on antibiotics. - Status post vancomycin , cefepime  in ED - MRSA nares negative - Continue ceftriaxone 1 g every 24 hours for CAP (11/23- anticipated end date 11/27) - Azithromycin 500 mg every 24 hours for 3 days - Follow-up lower respiratory culture - Follow-up blood cultures x 2   C/f G-tube site SSTI  See media tab.  G-tube placed on 11/7 by [facility] GI procedural team.  Erythema and white/yellow drainage noted around G-tube site.  Lower suspicion for buried bumper. Not painful per patient. EAT team consulted, low concern for infection at this time. - Discontinued vancomycin   for MRSA coverage - Other antibiotics as above - Appreciate EAT following   Toxic metabolic encephalopathy (resolved) Reported significant somnolence and disorientation over past 3 days.  Reportedly slept throughout most of daytime with intermittent periods of confusion.  Also reported episodes of twitching, possibly myoclonus.  At time of admission, patient is alert and oriented x 4, conversational with good insight to condition.  Possibly due to underlying infection, hypovolemia or medication induced. 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 Initially pT1N0 s/p limited pharyngectomy and excision of a FOM lesion 11/25/22 (no post-op RT). Then had biopsy proven recurrent disease at the L>R base of tongue, left fossa of Rosenmuller, and left piriform sinus -- PET with left level 3 node. Thus began definitive chemoradiation with weekyl Carboplatin/Paclitaxel and radiation therapy. Last carboplatin/paclitaxel infusion was on 11/14, and last radiation was completed on 11/23.  Did not receive last scheduled chemotherapy on 11/21 due to cytopenia and need for blood transfusion. -Primary oncologist notified. -Radiation Oncology consulted; continue with radiation next week as planned -Wound care consult for radiation dermatitis of neck; refer to wound care clinic on discharge   Cancer-related pain Significant pain related to cancer, worse after radiation 11/26.  Currently on oxycodone  10 mg / 15 mg every 4 hours as needed at home.  Does not report any adverse effect from use. - Continue oxycodone  10/15 mg oral solution through G-tube - Tylenol  650 mg every 4 hours as needed for mild pain   Sinus bradycardia, intermittent Intermittent heart rates in 50s. Asymptomatic. Takes metoprolol  at home which was held on admission. - Continue to monitor -Telemetry  ordered - Hold home metoprolol    Nutrition G-tube dependence Recent hospitalization for expedited G-tube placement with discharge on 11/11.  He swallows small amounts of liquid orally, but significant p.o. intake is limited by odynophagia and dysphagia from ongoing radiation therapy. - Continue previous tube feed, Osmolite 1.5 bolus regimen - Nutrition consult, appreciate recs   Chronic Problems  CKD stage III: Recent baseline Cr appears 1.2-1.4, with admission Cr of 1.4 Nicotine  use disorder: Nicotine  patch ordered  History of alcohol use disorder: No significant use recently  HLD - home Atorvastatin  40mg  daily, Ezetimibe 10mg  daily HTN -Home losartan held by outpatient provider.  Holding on admission. CAD s/p CABG - Continue home Plavix , statin     The patient's presentation is complicated by the following clinically significant conditions requiring additional evaluation and treatment: - Hypercoagulable state requiring additional attention to DVT prophylaxis and treatment or chronic anticoagulation secondary to Malignancy  - Chronic kidney disease POA requiring further investigation, treatment, or monitoring  - Immunocompromise state POA requiring further investigation, treatment, or monitoring - Coronary Artery Disease POA requiring further investigation, treatment, or monitoring              Issues Impacting Complexity of Management: <redacted file path> -The patient is at high risk of complications from malignancy   Medical Decision Making: Discussed the patient's management and/or test interpretation with Radiation Oncology as summarized within this note   Daily Checklist: Diet: Regular Diet DVT PPx: Lovenox  40mg  q24h Electrolytes: Replete Potassium to >/=4 and Magnesium  to >/=2 Code Status: Full Code Dispo: Continue care on the floor  Team Contact Information:  Primary Team: Oncology (MEDO) Primary Resident: Mallie FORBES Ramsay, MD Resident's Pager: [phone]  (Oncology Intern - Tower)  Interval History:  No acute events overnight.  Received one additional IV dilaudid  for increased neck pain yesterday evening, and he states pain is improved this morning and more tolerable. Otherwise still feeling improved, with no dyspnea and clearing of sputum. Had a bowel movement today. Continuing IV antibiotics, last day tomorrow.  Objective:  Temp:  [36.3 C (97.3 F)-37.3 C (99.2 F)] 36.3 C (97.3 F) Pulse:  [63-90] 71 SpO2 Pulse:  [71] 71 Resp:  [16-19] 16 BP: (96-155)/(55-75) 141/75 SpO2:  [96 %-100 %] 97 %  Gen: NAD, alert and converses, resting in bed, hoarse voice Eyes: Sclera anicteric, EOMI grossly normal  HENT: Atraumatic, normocephalic Heart: RRR Lungs: Normal work of breathing on room air, diminished at LLL, frequent secretions, occasional cough Abdomen: G-tube site clean and dry, surrounding erythema without fluctuance or tenderness Extremities: No edema Neuro: Grossly symmetric, non-focal   Skin:  Erythematous patches on neck at radiation site Psych: Alert, oriented    Mallie CHARLENA Ramsay, MD [facility] Internal Medicine, PGY1

## 2024-08-12 NOTE — Discharge Summary (Signed)
 ------------------------------------------------------------------------------- Attestation signed by Tiajuana Sages, MD at 08/13/24 1332 I saw and examined patient with Dr. Carlos Hocking, Resident in Medicine, and agree with her assessment and plan. I spent 15 minutes discussing discharge medications and future oncology plans. -------------------------------------------------------------------------------   Physician Discharge Summary [facility] [address] [place] [address] Dept: [phone] Loc: [phone]   Identifying Information:  Brad Chen [birth year] [id number]  Primary Care Physician: Lenon Layman ORN, MD   Code Status: Full Code  Admit Date: 08/08/2024  Discharge Date: 08/12/2024   Discharge To: Home with Home Health and/or PT/OT  Discharge Service: [facility] - Oncology Floor Team ([facility] - [place])   Discharge Attending Physician: No att. providers found  Discharge Diagnoses:  Principal Problem:   Pneumonia (POA: Unknown) Active Problems:   Cancer of hypopharynx (CMS-HCC) (POA: Yes)   Coronary artery disease (POA: Yes)   Hyperlipidemia (POA: Yes)   Hypertension (POA: Yes)   S/P coronary artery bypass graft x 3 (POA: Not Applicable) Resolved Problems:   * No resolved hospital problems. *  Outpatient Provider Follow Up Issues:  [ ]  Next Radiation oncology treatment and clinic visit 12/1 [ ]  [facility] WOCN being set up; G tube and radiation burn recs below   Hospital Course:  Brad Chen is a 63 y.o. male who is presenting to [facility] with Pneumonia, in the setting of the following pertinent/contributing co-morbidities:  SCC of R oropharynx (stage III, hypopharyngeal), CKD, HTN, CAD s/p CABG, HLD, nicotine  use presenting with somnolence and altered mental status. Hospital course by problem below.   Community acquired pneumonia  Hypoxic respiratory failure (Resolved)  Usually has productive cough of white sputum due to ongoing radiation  therapy, but quality of sputum is now mucopurulent.  Chest x-ray on admission with a left retrocardiac opacity.  No reported fever, chills. Mildly neutropenic with ANC 1.6. Initially hypoxic in ED requiring 6 L nasal cannula, weaned to 1 L on admission and shortly after to room air. Dyspnea and productive sputum much improved on CAP abx (CTX, azithro)  C/f G-tube site SSTI  See media tab. G-tube placed on 11/7 by [facility] GI procedural team.  Erythema and white/yellow drainage noted around G-tube site.  Lower suspicion for buried bumper. Not painful per patient. EAT team consulted, low concern for infection at this time. EAT team followed and provided education on G-tube care.   Recommendations on discharge: - Recommend skin care BID - Gently cleanse skin around insertion site with normal saline moistened gauze NOTE: If crusty drainage is present around tube, moisten a cotton-tipped applicator with sterile water/saline to gently remove drainage. Do not use hydrogen peroxide. - Rotate G-tube 90-180 degrees daily. Continue for approximately 2-4 weeks, or until healed.   Toxic metabolic encephalopathy (resolved) Reported significant somnolence and disorientation over past 3 days.  Reportedly slept throughout most of daytime with intermittent periods of confusion.  Also reported episodes of twitching, possibly myoclonus.  At time of admission, patient is alert and oriented x 4, conversational with good insight to condition.  Possibly due to underlying infection, hypovolemia or medication induced. Improved with treatment of infection as above.   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 Initially pT1N0 s/p limited pharyngectomy and excision of a FOM lesion 11/25/22 (no post-op RT). Then had biopsy proven recurrent disease at the L>R base of tongue, left fossa of Rosenmuller, and left piriform sinus --  PET with  left level 3 node. Thus began definitive chemoradiation with weekyl Carboplatin/Paclitaxel and radiation therapy. Last carboplatin/paclitaxel infusion was on 11/14, and last radiation was completed on 11/23.  Did not receive last scheduled chemotherapy on 11/21 due to cytopenia and need for blood transfusion. Primary oncologist notified. Radiation treatment continued as planned prior to admission with sessions 11/25- 11/26, pausing for holiday and resuming 12/1. Wound care consulted for radiation dermatitis of neck; on discharge recommended:  Cleanse with vashe soak x 5 mins. Apply mepilex AG foam secure with #5 size stretch netting change every other day.   Cancer-related pain Significant pain related to cancer, worse after radiation 11/26.  Currently on oxycodone  10 mg / 15 mg every 4 hours as needed at home. Does not report any adverse effect from use. Continued home oxycodone  10/15 mg oral solution through G-tube and spot doses of IV dilaudid  for post-radiation pain.   Sinus bradycardia, intermittent Intermittent heart rates in 50s. Asymptomatic. Takes metoprolol  at home which was held on admission given soft BPs. Held home metoprolol  on discharge; to be restarted at PCP/oncologist discretion.   Nutrition G-tube dependence Recent hospitalization for expedited G-tube placement with discharge on 11/11.  He swallows small amounts of liquid orally, but significant p.o. intake is limited by odynophagia and dysphagia from ongoing radiation therapy. Nutrition consulted and continued previous tube feed, Osmolite 1.5 bolus regimen.   Chronic Problems   CKD stage III: Recent baseline Cr appears 1.2-1.4, with admission Cr of 1.4 Nicotine  use disorder: Nicotine  patch ordered  History of alcohol use disorder: No significant use recently  HLD - home Atorvastatin  40mg  daily, Ezetimibe 10mg  daily HTN -Home losartan held by outpatient provider.  Holding on admission. CAD s/p CABG - Continue home  Plavix , statin   Touchbase with Outpatient Provider: Warm Handoff: Completed on 08/12/24 by Carlos Hocking, MD  (Resident) via [facility] ______________________________________________________________________ Discharge Medications:    Your Medication List     PAUSE taking these medications    losartan 25 MG tablet Wait to take this until your doctor or other care provider tells you to start again. Commonly known as: COZAAR 1 tablet (25 mg total) by G-tube route daily.   metoPROLOL  tartrate (LOPRESSOR ) solution 10 mg/mL Wait to take this until your doctor or other care provider tells you to start again. 1.25 mL (12.5 mg total) by Enteral tube: gastric route two (2) times a day.       STOP taking these medications    metoPROLOL  succinate 25 MG 24 hr tablet Commonly known as: Toprol -XL   mupirocin 2 % ointment Commonly known as: BACTROBAN   thiamine  100 MG tablet Commonly known as: B-1       CONTINUE taking these medications    atorvastatin  40 MG tablet Commonly known as: LIPITOR 1 tablet (40 mg total) by G-tube route daily.   clopidogrel  75 mg tablet Commonly known as: PLAVIX  1 tablet (75 mg total) by G-tube route in the morning.   diphenhydrAMINE  12.5 mg/5 mL liquid Commonly known as: BENADRYL  Mix equal parts diphenhydramine , lidocaine , and liquid antacid. Swish 5 to 10mLs in mouth for 60 seconds, every 4 to 6 hours as needed, then swallow or spit mixture out (as directed by prescriber). Shake well before using. Refrigerate after mixing and discard after 14 days.   ezetimibe 10 mg tablet Commonly known as: ZETIA 1 tablet (10 mg total) by G-tube route daily.   folic acid  400 MCG tablet Commonly known as: FOLVITE  1 tablet (400 mcg total) by  G-tube route in the morning.   gabapentin 250 mg/5 mL oral solution Commonly known as: NEURONTIN Start with 300 mg (6 mL) by mouth at bedtime. Can increase to 300 mg (6 mL) twice a day if needed.   GERI-LANTA  200-200-20 mg/5 mL Susp Generic drug: alum-mag-simeth Mix equal parts diphenhydramine , lidocaine , and liquid antacid. Swish 5 to 10mLs in mouth for 60 seconds, every 4 to 6 hours as needed, then swallow or spit mixture out (as directed by prescriber). Shake well before using. Refrigerate after mixing and discard after 14 days.   lidocaine  2 % solution Generic drug: lidocaine  Mix equal parts diphenhydramine , lidocaine , and liquid antacid. Swish 5 to 10mLs in mouth for 60 seconds, every 4 to 6 hours as needed, then swallow or spit mixture out (as directed by prescriber). Shake well before using. Refrigerate after mixing and discard after 14 days.   MULTI-VITE 9 mg iron/15 mL Liqd Generic drug: multivitamin, therapeutic with minerals 15 mL by Enteral tube: gastric route daily.   nicotine  14 mg/24 hr patch Commonly known as: NICODERM CQ  Place 1 patch on the skin daily. Remove old patch before applying new one.   ondansetron  8 MG tablet Commonly known as: ZOFRAN  1 tablet (8 mg total) by G-tube route every eight (8) hours as needed for nausea (or vomiting).   oxyCODONE  5 mg/5 mL solution Commonly known as: ROXICODONE  Take 10-15 mL (10-15 mg total) by mouth every four (4) hours as needed for pain   pantoprazole  (Protonix ) 2 mg/mL oral suspension 20 mL (40 mg total) by Enteral tube: gastric route two (2) times a day for 14 days.   prochlorperazine 10 MG tablet Commonly known as: COMPAZINE 1 tablet (10 mg total) by G-tube route every six (6) hours as needed (Nausea/Vomiting).   sennosides 8.8 mg/5 mL Syrp Commonly known as: sennosides Take 5 mL (8.8 mg total) by mouth two (2) times a day.        Allergies: Patient has no known allergies. ______________________________________________________________________ Pending Test Results: Pending Labs     Order Current Status   Blood Culture #1 Preliminary result   Blood Culture #2 Preliminary result   Lower Respiratory Culture  Preliminary result       Most Recent Labs: All lab results last 24 hours - No results found for this or any previous visit (from the past 24 hours).  Relevant Studies/Radiology: No results found. ______________________________________________________________________ Discharge Instructions:  Activity Instructions     Activity as tolerated         Follow Up instructions and Outpatient Referrals    Ambulatory Referral to Home Health     Reason for referral: pt/ot   Physician to follow patient's care: PCP   Disciplines requested:  Physical Therapy Occupational Therapy     Physical Therapy requested:  Strengthening exercises Evaluate and treat     Occupational Therapy Requested:  Strengthening exercises Evaluate and treat ADL or IADL training     Ambulatory Referral to Home Health     Reason for referral: PT/OT/SN   Physician to follow patient's care: PCP Comment - LENON LAYMAN ORN   Disciplines requested:  Physical Therapy Nursing Occupational Therapy     Nursing requested:  Wound Care Tube Feeds per tube feed order     Wound count: Wound 2   Wound 1 type/staging: Insertion Site   Wound 1 location: Abdomen   Wound 1 care orders: Gently cleanse skin around insertion site w/normal  saline moistened gauze. If crusty drainage is  present around tube, moisten  a cotton-tipped applicator w/sterile water/saline to remove drainage. No  hydrogen peroxide.Rotate G-tube 90-180 degrees daily. .   Wound 1 order frequency: Twice daily cleansing. Continue for  approximately 2-4 weeks, or until healed.   Wound 2 type/staging: Radiation Burn   Wound 2 location: L neck   Wound 2 care orders: Recommend cleanse with vashe soak x 5 mins. And  apply mepilex AG foam secure with #5 size stretch netting change every  other day.   Wound 2 order frequency: Every other day   Physical Therapy requested:  Strengthening exercises Ambulation training Evaluate and treat     Occupational  Therapy Requested:  Strengthening exercises ADL or IADL training Evaluate and treat Transfer training     Call MD for:  persistent nausea or vomiting     Call MD for:  severe uncontrolled pain     Call MD for: Temperature > 38.5 Celsius ( > 101.3 Fahrenheit)     Discharge instructions       Appointments which have been scheduled for you    Aug 15, 2024 3:00 PM (Arrive by 2:30 PM) TREATMENT with [facility] RADIATION [place]COLOGY [place] [facility] REGION) [address] [place] [address] [phone]     Aug 15, 2024 3:15 PM (Arrive by 2:45 PM) OFFICE VISIT with Marce Candis Kallman, MD [facility] RADIATION ONCOLOGY [place] [facility] REGION) [address] [place] [address] [phone]     Aug 16, 2024 8:45 AM (Arrive by 8:15 AM) INFUSION ONLY with ONCDEV CHAIR [facility] ONCOLOGY [place]FUSION [place] [facility] REGION) [address] [place] [address] [phone]     Aug 16, 2024 3:00 PM (Arrive by 2:30 PM) TREATMENT with RADONC [facility] RADIATION ONCOLOGY [place] [facility] REGION) [address] [place] [address] [phone]     Aug 17, 2024 3:00 PM (Arrive by 2:30 PM) TREATMENT with [facility]ON[facility] RADIATION ONCOLOGY [place] [facility] REGION) [address] [place] [address] [phone]     Aug 18, 2024 3:00 PM (Arrive by 2:30 PM) TREATMENT with [facility]ON[facility] RADIATION ONCOLOGY [place] [facility] REGION) [address] [place] [address] [phone]     Aug 19, 2024 3:00 PM (Arrive by 2:30 PM) TREATMENT with [facility]ON[facility] RADIATION ONCOLOGY [place] [facility] REGION) [address] [place] [address] [phone]     Sep 28, 2024 10:45 AM (Arrive by 10:15 AM) OFFICE VISIT with Reyes Gladis Ovens, MD [facility] ONCOLOGY  [facility] REGION) [address] [place] [address] [phone]        ______________________________________________________________________ Discharge Day Services: BP 115/58   Pulse 63   Temp 36.9 C (98.4 F) (Oral)   Resp 18   Ht 174 cm (5' 8.5)   Wt 60.1 kg (132 lb 6.4 oz)   SpO2 97%   BMI 19.84 kg/m   Pt seen on the day of discharge and determined appropriate for discharge.  Condition at Discharge: good  Length of Discharge: I spent greater than 30 mins in the discharge of this patient.

## 2024-08-13 ENCOUNTER — Other Ambulatory Visit: Payer: Self-pay

## 2024-08-13 ENCOUNTER — Emergency Department

## 2024-08-13 ENCOUNTER — Inpatient Hospital Stay
Admission: EM | Admit: 2024-08-13 | Discharge: 2024-08-15 | DRG: 314 | Disposition: A | Discharge status: Home Health | Attending: Internal Medicine | Admitting: Internal Medicine

## 2024-08-13 DIAGNOSIS — Z8249 Family history of ischemic heart disease and other diseases of the circulatory system: Secondary | ICD-10-CM

## 2024-08-13 DIAGNOSIS — G629 Polyneuropathy, unspecified: Secondary | ICD-10-CM

## 2024-08-13 DIAGNOSIS — K219 Gastro-esophageal reflux disease without esophagitis: Secondary | ICD-10-CM | POA: Diagnosis not present

## 2024-08-13 DIAGNOSIS — Z7902 Long term (current) use of antithrombotics/antiplatelets: Secondary | ICD-10-CM

## 2024-08-13 DIAGNOSIS — Z8701 Personal history of pneumonia (recurrent): Secondary | ICD-10-CM

## 2024-08-13 DIAGNOSIS — Y842 Radiological procedure and radiotherapy as the cause of abnormal reaction of the patient, or of later complication, without mention of misadventure at the time of the procedure: Secondary | ICD-10-CM | POA: Diagnosis not present

## 2024-08-13 DIAGNOSIS — Z85828 Personal history of other malignant neoplasm of skin: Secondary | ICD-10-CM | POA: Diagnosis not present

## 2024-08-13 DIAGNOSIS — C139 Malignant neoplasm of hypopharynx, unspecified: Secondary | ICD-10-CM | POA: Diagnosis not present

## 2024-08-13 DIAGNOSIS — Z86718 Personal history of other venous thrombosis and embolism: Secondary | ICD-10-CM

## 2024-08-13 DIAGNOSIS — T2007XA Burn of unspecified degree of neck, initial encounter: Secondary | ICD-10-CM | POA: Diagnosis present

## 2024-08-13 DIAGNOSIS — R54 Age-related physical debility: Secondary | ICD-10-CM | POA: Diagnosis present

## 2024-08-13 DIAGNOSIS — G9340 Encephalopathy, unspecified: Secondary | ICD-10-CM

## 2024-08-13 DIAGNOSIS — Z7982 Long term (current) use of aspirin: Secondary | ICD-10-CM | POA: Diagnosis not present

## 2024-08-13 DIAGNOSIS — R001 Bradycardia, unspecified: Secondary | ICD-10-CM | POA: Diagnosis not present

## 2024-08-13 DIAGNOSIS — R4182 Altered mental status, unspecified: Principal | ICD-10-CM

## 2024-08-13 DIAGNOSIS — I959 Hypotension, unspecified: Secondary | ICD-10-CM | POA: Diagnosis not present

## 2024-08-13 DIAGNOSIS — G9349 Other encephalopathy: Secondary | ICD-10-CM | POA: Diagnosis present

## 2024-08-13 DIAGNOSIS — R197 Diarrhea, unspecified: Secondary | ICD-10-CM

## 2024-08-13 DIAGNOSIS — Z87891 Personal history of nicotine dependence: Secondary | ICD-10-CM

## 2024-08-13 DIAGNOSIS — C091 Malignant neoplasm of tonsillar pillar (anterior) (posterior): Secondary | ICD-10-CM | POA: Diagnosis not present

## 2024-08-13 DIAGNOSIS — E860 Dehydration: Secondary | ICD-10-CM

## 2024-08-13 DIAGNOSIS — I2510 Atherosclerotic heart disease of native coronary artery without angina pectoris: Secondary | ICD-10-CM | POA: Diagnosis present

## 2024-08-13 DIAGNOSIS — Z6820 Body mass index (BMI) 20.0-20.9, adult: Secondary | ICD-10-CM | POA: Diagnosis not present

## 2024-08-13 DIAGNOSIS — E43 Unspecified severe protein-calorie malnutrition: Secondary | ICD-10-CM | POA: Diagnosis present

## 2024-08-13 DIAGNOSIS — A419 Sepsis, unspecified organism: Secondary | ICD-10-CM

## 2024-08-13 DIAGNOSIS — L03221 Cellulitis of neck: Secondary | ICD-10-CM | POA: Diagnosis not present

## 2024-08-13 DIAGNOSIS — E861 Hypovolemia: Secondary | ICD-10-CM | POA: Diagnosis not present

## 2024-08-13 DIAGNOSIS — E875 Hyperkalemia: Secondary | ICD-10-CM | POA: Diagnosis not present

## 2024-08-13 DIAGNOSIS — K1233 Oral mucositis (ulcerative) due to radiation: Secondary | ICD-10-CM | POA: Diagnosis present

## 2024-08-13 DIAGNOSIS — E785 Hyperlipidemia, unspecified: Secondary | ICD-10-CM | POA: Diagnosis present

## 2024-08-13 DIAGNOSIS — I10 Essential (primary) hypertension: Secondary | ICD-10-CM | POA: Diagnosis present

## 2024-08-13 DIAGNOSIS — C109 Malignant neoplasm of oropharynx, unspecified: Secondary | ICD-10-CM

## 2024-08-13 DIAGNOSIS — Z931 Gastrostomy status: Secondary | ICD-10-CM

## 2024-08-13 DIAGNOSIS — C140 Malignant neoplasm of pharynx, unspecified: Secondary | ICD-10-CM

## 2024-08-13 DIAGNOSIS — Z79899 Other long term (current) drug therapy: Secondary | ICD-10-CM

## 2024-08-13 LAB — PROTIME-INR
INR: 0.9 (ref 0.8–1.2)
Prothrombin Time: 12.7 s (ref 11.4–15.2)

## 2024-08-13 LAB — URINALYSIS, W/ REFLEX TO CULTURE (INFECTION SUSPECTED)
Bilirubin Urine: NEGATIVE
Glucose, UA: NEGATIVE mg/dL
Hgb urine dipstick: NEGATIVE
Ketones, ur: NEGATIVE mg/dL
Leukocytes,Ua: NEGATIVE
Nitrite: NEGATIVE
Protein, ur: NEGATIVE mg/dL
Specific Gravity, Urine: 1.017 (ref 1.005–1.030)
pH: 7 (ref 5.0–8.0)

## 2024-08-13 LAB — CBC
HCT: 30.5 % — ABNORMAL LOW (ref 39.0–52.0)
Hemoglobin: 9.7 g/dL — ABNORMAL LOW (ref 13.0–17.0)
MCH: 32.4 pg (ref 26.0–34.0)
MCHC: 31.8 g/dL (ref 30.0–36.0)
MCV: 102 fL — ABNORMAL HIGH (ref 80.0–100.0)
Platelets: 372 K/uL (ref 150–400)
RBC: 2.99 MIL/uL — ABNORMAL LOW (ref 4.22–5.81)
RDW: 13.6 % (ref 11.5–15.5)
WBC: 5.2 K/uL (ref 4.0–10.5)
nRBC: 0 % (ref 0.0–0.2)

## 2024-08-13 LAB — COMPREHENSIVE METABOLIC PANEL WITH GFR
ALT: 24 U/L (ref 0–44)
AST: 26 U/L (ref 15–41)
Albumin: 3.1 g/dL — ABNORMAL LOW (ref 3.5–5.0)
Alkaline Phosphatase: 61 U/L (ref 38–126)
Anion gap: 7 (ref 5–15)
BUN: 19 mg/dL (ref 8–23)
CO2: 31 mmol/L (ref 22–32)
Calcium: 8.9 mg/dL (ref 8.9–10.3)
Chloride: 101 mmol/L (ref 98–111)
Creatinine, Ser: 1.21 mg/dL (ref 0.61–1.24)
GFR, Estimated: >60 mL/min (ref >60)
Glucose, Bld: 110 mg/dL — ABNORMAL HIGH (ref 70–99)
Potassium: 5.3 mmol/L — ABNORMAL HIGH (ref 3.5–5.1)
Sodium: 139 mmol/L (ref 135–145)
Total Bilirubin: 0.3 mg/dL (ref 0.0–1.2)
Total Protein: 6.4 g/dL — ABNORMAL LOW (ref 6.5–8.1)

## 2024-08-13 LAB — TROPONIN T, HIGH SENSITIVITY
Troponin T High Sensitivity: 54 ng/L — ABNORMAL HIGH (ref 0–19)
Troponin T High Sensitivity: 43 ng/L — ABNORMAL HIGH (ref 0–19)

## 2024-08-13 LAB — RESP PANEL BY RT-PCR (RSV, FLU A&B, COVID)  RVPGX2
Influenza A by PCR: NEGATIVE
Influenza B by PCR: NEGATIVE
Resp Syncytial Virus by PCR: NEGATIVE
SARS Coronavirus 2 by RT PCR: NEGATIVE

## 2024-08-13 LAB — CBG MONITORING, ED: Glucose-Capillary: 84 mg/dL (ref 70–99)

## 2024-08-13 LAB — LACTIC ACID, PLASMA: Lactic Acid, Venous: 0.8 mmol/L (ref 0.5–1.9)

## 2024-08-13 MED ORDER — MORPHINE SULFATE (PF) 2 MG/ML IV SOLN
2.0000 mg | Freq: Once | INTRAVENOUS | Status: AC
  Administered 2024-08-13: 2 mg via INTRAVENOUS
  Filled 2024-08-13: qty 1

## 2024-08-13 MED ORDER — ACETAMINOPHEN 325 MG PO TABS
650.0000 mg | ORAL_TABLET | Freq: Four times a day (QID) | ORAL | Status: DC | PRN
  Filled 2024-08-13: qty 2

## 2024-08-13 MED ORDER — SODIUM CHLORIDE 0.9 % IV SOLN
2.0000 g | Freq: Once | INTRAVENOUS | Status: AC
  Administered 2024-08-13: 2 g via INTRAVENOUS
  Filled 2024-08-13: qty 12.5

## 2024-08-13 MED ORDER — SODIUM CHLORIDE 0.9 % IV SOLN: INTRAVENOUS | Status: AC

## 2024-08-13 MED ORDER — ATORVASTATIN CALCIUM 20 MG PO TABS
40.0000 mg | ORAL_TABLET | Freq: Every day | ORAL | Status: DC
  Administered 2024-08-14 – 2024-08-15 (×2): 40 mg via ORAL
  Filled 2024-08-13 (×2): qty 2

## 2024-08-13 MED ORDER — IOHEXOL 300 MG/ML  SOLN
100.0000 mL | Freq: Once | INTRAMUSCULAR | Status: AC | PRN
  Administered 2024-08-13: 100 mL via INTRAVENOUS

## 2024-08-13 MED ORDER — LACTATED RINGERS IV BOLUS
1000.0000 mL | Freq: Once | INTRAVENOUS | Status: AC
  Administered 2024-08-13: 1000 mL via INTRAVENOUS

## 2024-08-13 MED ORDER — NALTREXONE HCL 50 MG PO TABS
50.0000 mg | ORAL_TABLET | Freq: Every day | ORAL | Status: DC
  Administered 2024-08-14 – 2024-08-15 (×3): 50 mg via ORAL
  Filled 2024-08-13 (×3): qty 1

## 2024-08-13 MED ORDER — SODIUM CHLORIDE 0.9 % IV SOLN
2.0000 g | INTRAVENOUS | Status: DC
  Administered 2024-08-14: 2 g via INTRAVENOUS
  Filled 2024-08-13: qty 20

## 2024-08-13 MED ORDER — LACTATED RINGERS IV BOLUS (SEPSIS)
1000.0000 mL | Freq: Once | INTRAVENOUS | Status: AC
Start: 2024-08-13 — End: 2024-08-13
  Administered 2024-08-13: 1000 mL via INTRAVENOUS

## 2024-08-13 MED ORDER — VANCOMYCIN HCL IN DEXTROSE 1-5 GM/200ML-% IV SOLN
1000.0000 mg | Freq: Once | INTRAVENOUS | Status: AC
  Administered 2024-08-13: 1000 mg via INTRAVENOUS
  Filled 2024-08-13: qty 200

## 2024-08-13 MED ORDER — MAGNESIUM HYDROXIDE 400 MG/5ML PO SUSP: 30.0000 mL | Freq: Every day | ORAL | Status: DC | PRN

## 2024-08-13 MED ORDER — ONDANSETRON HCL 4 MG PO TABS: 4.0000 mg | ORAL_TABLET | Freq: Four times a day (QID) | ORAL | Status: DC | PRN

## 2024-08-13 MED ORDER — EZETIMIBE 10 MG PO TABS
10.0000 mg | ORAL_TABLET | Freq: Every day | ORAL | Status: DC
  Administered 2024-08-14 – 2024-08-15 (×2): 10 mg via ORAL
  Filled 2024-08-13 (×2): qty 1

## 2024-08-13 MED ORDER — TRAZODONE HCL 50 MG PO TABS
25.0000 mg | ORAL_TABLET | Freq: Every evening | ORAL | Status: DC | PRN
  Administered 2024-08-14: 25 mg via ORAL
  Filled 2024-08-13: qty 1

## 2024-08-13 MED ORDER — CLOPIDOGREL BISULFATE 75 MG PO TABS
75.0000 mg | ORAL_TABLET | Freq: Every day | ORAL | Status: DC
  Administered 2024-08-14 – 2024-08-15 (×2): 75 mg via ORAL
  Filled 2024-08-13 (×2): qty 1

## 2024-08-13 MED ORDER — ACETAMINOPHEN 650 MG RE SUPP
650.0000 mg | Freq: Four times a day (QID) | RECTAL | Status: DC | PRN
  Administered 2024-08-14: 650 mg via RECTAL
  Filled 2024-08-13: qty 1

## 2024-08-13 MED ORDER — METRONIDAZOLE 500 MG/100ML IV SOLN
500.0000 mg | Freq: Once | INTRAVENOUS | Status: AC
  Administered 2024-08-13: 500 mg via INTRAVENOUS
  Filled 2024-08-13: qty 100

## 2024-08-13 MED ORDER — ENOXAPARIN SODIUM 40 MG/0.4ML IJ SOSY
40.0000 mg | PREFILLED_SYRINGE | INTRAMUSCULAR | Status: DC
  Administered 2024-08-14 – 2024-08-15 (×2): 40 mg via SUBCUTANEOUS
  Filled 2024-08-13 (×2): qty 0.4

## 2024-08-13 MED ORDER — ONDANSETRON HCL 4 MG/2ML IJ SOLN
4.0000 mg | Freq: Four times a day (QID) | INTRAMUSCULAR | Status: DC | PRN
  Administered 2024-08-14: 4 mg via INTRAVENOUS
  Filled 2024-08-13: qty 2
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
## 2024-08-13 NOTE — Assessment & Plan Note (Addendum)
 Home metoprolol  and losartan was recently discontinued due to intermittent hypotension by his providers. - Currently blood pressure within goal -Continue to monitor

## 2024-08-13 NOTE — ED Triage Notes (Signed)
 [facility] nurse note: Pt to ED via [facility] from home. Pt called for hypotension and tremors. Pt has been having decreased mobility and activity. Pt reports was seen yesterday and dx with PNA. Hx of throat cancer. Next tx on Monday.   HR 63 99% RA 130/68 Oral temp 99.6

## 2024-08-13 NOTE — Assessment & Plan Note (Signed)
-   Will continue PPI therapy.

## 2024-08-13 NOTE — Consult Note (Signed)
 CODE SEPSIS - PHARMACY COMMUNICATION  **Broad Spectrum Antibiotics should be administered within 1 hour of Sepsis diagnosis**  Time Code Sepsis Called/Page Received: 1638  Antibiotics Ordered: Vancomycin , Cefepime , Metronidazole   Time of 1st antibiotic administration: 1648  Additional action taken by pharmacy: none   If necessary, Name of Provider/Nurse Contacted: n/a    Brad Chen ,PharmD Clinical Pharmacist  08/13/2024  4:39 PM
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
## 2024-08-13 NOTE — Assessment & Plan Note (Signed)
-   Will continue Neurontin .
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
## 2024-08-13 NOTE — ED Triage Notes (Signed)
 Pt to ed from home via [facility]. Pt is unable to answer my questions in triage and having a hard time staying awake. Pt is arousable upon calling his name and touching him,

## 2024-08-13 NOTE — ED Notes (Signed)
 Pt is alert, sitting up in bed talking to wife.

## 2024-08-13 NOTE — Assessment & Plan Note (Deleted)
-   Will continue direct therapy with IV Rocephin and vancomycin .

## 2024-08-13 NOTE — Assessment & Plan Note (Addendum)
 Known head and neck cancer which is being under treatment with radiation and chemo at [facility]. - Patient has his radiation on Monday and chemotherapy on Tuesday which he will continue at [facility].
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
## 2024-08-13 NOTE — ED Notes (Signed)
 Pt to CT

## 2024-08-13 NOTE — ED Notes (Signed)
 Sacral pad changed that was placed from previous admission.  Blanchable redness to right lower buttocks.  Skin intact and dry.

## 2024-08-13 NOTE — Assessment & Plan Note (Addendum)
 Will continue statin therapy and Zetia.

## 2024-08-14 DIAGNOSIS — E860 Dehydration: Secondary | ICD-10-CM | POA: Diagnosis not present

## 2024-08-14 DIAGNOSIS — C140 Malignant neoplasm of pharynx, unspecified: Secondary | ICD-10-CM | POA: Diagnosis not present

## 2024-08-14 DIAGNOSIS — R4182 Altered mental status, unspecified: Secondary | ICD-10-CM | POA: Diagnosis present

## 2024-08-14 DIAGNOSIS — K219 Gastro-esophageal reflux disease without esophagitis: Secondary | ICD-10-CM | POA: Diagnosis present

## 2024-08-14 DIAGNOSIS — R197 Diarrhea, unspecified: Secondary | ICD-10-CM | POA: Diagnosis present

## 2024-08-14 DIAGNOSIS — E875 Hyperkalemia: Secondary | ICD-10-CM | POA: Insufficient documentation

## 2024-08-14 DIAGNOSIS — Z7902 Long term (current) use of antithrombotics/antiplatelets: Secondary | ICD-10-CM | POA: Diagnosis not present

## 2024-08-14 DIAGNOSIS — Z85828 Personal history of other malignant neoplasm of skin: Secondary | ICD-10-CM | POA: Diagnosis not present

## 2024-08-14 DIAGNOSIS — I959 Hypotension, unspecified: Secondary | ICD-10-CM | POA: Diagnosis present

## 2024-08-14 DIAGNOSIS — T2007XA Burn of unspecified degree of neck, initial encounter: Secondary | ICD-10-CM | POA: Diagnosis present

## 2024-08-14 DIAGNOSIS — G629 Polyneuropathy, unspecified: Secondary | ICD-10-CM | POA: Diagnosis present

## 2024-08-14 DIAGNOSIS — R54 Age-related physical debility: Secondary | ICD-10-CM | POA: Diagnosis present

## 2024-08-14 DIAGNOSIS — Z8249 Family history of ischemic heart disease and other diseases of the circulatory system: Secondary | ICD-10-CM | POA: Diagnosis not present

## 2024-08-14 DIAGNOSIS — Y842 Radiological procedure and radiotherapy as the cause of abnormal reaction of the patient, or of later complication, without mention of misadventure at the time of the procedure: Secondary | ICD-10-CM | POA: Diagnosis present

## 2024-08-14 DIAGNOSIS — C109 Malignant neoplasm of oropharynx, unspecified: Secondary | ICD-10-CM

## 2024-08-14 DIAGNOSIS — I2510 Atherosclerotic heart disease of native coronary artery without angina pectoris: Secondary | ICD-10-CM | POA: Diagnosis present

## 2024-08-14 DIAGNOSIS — G9340 Encephalopathy, unspecified: Secondary | ICD-10-CM | POA: Diagnosis not present

## 2024-08-14 DIAGNOSIS — Z87891 Personal history of nicotine dependence: Secondary | ICD-10-CM | POA: Diagnosis not present

## 2024-08-14 DIAGNOSIS — L03221 Cellulitis of neck: Secondary | ICD-10-CM | POA: Diagnosis present

## 2024-08-14 DIAGNOSIS — C091 Malignant neoplasm of tonsillar pillar (anterior) (posterior): Secondary | ICD-10-CM | POA: Diagnosis present

## 2024-08-14 DIAGNOSIS — G6289 Other specified polyneuropathies: Secondary | ICD-10-CM

## 2024-08-14 DIAGNOSIS — E861 Hypovolemia: Secondary | ICD-10-CM | POA: Diagnosis not present

## 2024-08-14 DIAGNOSIS — Z7982 Long term (current) use of aspirin: Secondary | ICD-10-CM | POA: Diagnosis not present

## 2024-08-14 DIAGNOSIS — E43 Unspecified severe protein-calorie malnutrition: Secondary | ICD-10-CM | POA: Diagnosis present

## 2024-08-14 DIAGNOSIS — E785 Hyperlipidemia, unspecified: Secondary | ICD-10-CM | POA: Diagnosis present

## 2024-08-14 DIAGNOSIS — I10 Essential (primary) hypertension: Secondary | ICD-10-CM | POA: Diagnosis present

## 2024-08-14 DIAGNOSIS — G9349 Other encephalopathy: Secondary | ICD-10-CM | POA: Diagnosis present

## 2024-08-14 DIAGNOSIS — Z6820 Body mass index (BMI) 20.0-20.9, adult: Secondary | ICD-10-CM | POA: Diagnosis not present

## 2024-08-14 DIAGNOSIS — C139 Malignant neoplasm of hypopharynx, unspecified: Secondary | ICD-10-CM | POA: Diagnosis present

## 2024-08-14 DIAGNOSIS — Z931 Gastrostomy status: Secondary | ICD-10-CM | POA: Diagnosis not present

## 2024-08-14 DIAGNOSIS — R001 Bradycardia, unspecified: Secondary | ICD-10-CM | POA: Diagnosis present

## 2024-08-14 LAB — CBC
HCT: 31.7 % — ABNORMAL LOW (ref 39.0–52.0)
Hemoglobin: 10.2 g/dL — ABNORMAL LOW (ref 13.0–17.0)
MCH: 32.6 pg (ref 26.0–34.0)
MCHC: 32.2 g/dL (ref 30.0–36.0)
MCV: 101.3 fL — ABNORMAL HIGH (ref 80.0–100.0)
Platelets: 397 K/uL (ref 150–400)
RBC: 3.13 MIL/uL — ABNORMAL LOW (ref 4.22–5.81)
RDW: 13.4 % (ref 11.5–15.5)
WBC: 9.4 K/uL (ref 4.0–10.5)
nRBC: 0 % (ref 0.0–0.2)

## 2024-08-14 LAB — GASTROINTESTINAL PANEL BY PCR, STOOL (REPLACES STOOL CULTURE)

## 2024-08-14 LAB — BASIC METABOLIC PANEL WITH GFR
Anion gap: 11 (ref 5–15)
BUN: 16 mg/dL (ref 8–23)
CO2: 24 mmol/L (ref 22–32)
Calcium: 9.1 mg/dL (ref 8.9–10.3)
Chloride: 105 mmol/L (ref 98–111)
Creatinine, Ser: 1.07 mg/dL (ref 0.61–1.24)
GFR, Estimated: >60 mL/min (ref >60)
Glucose, Bld: 101 mg/dL — ABNORMAL HIGH (ref 70–99)
Potassium: 3.7 mmol/L (ref 3.5–5.1)
Sodium: 140 mmol/L (ref 135–145)

## 2024-08-14 LAB — C DIFFICILE QUICK SCREEN W PCR REFLEX
C Diff antigen: NEGATIVE
C Diff interpretation: NOT DETECTED
C Diff toxin: NEGATIVE

## 2024-08-14 MED ORDER — VANCOMYCIN HCL IN DEXTROSE 1-5 GM/200ML-% IV SOLN
1000.0000 mg | Freq: Once | INTRAVENOUS | Status: AC
  Administered 2024-08-14: 1000 mg via INTRAVENOUS
  Filled 2024-08-14: qty 200

## 2024-08-14 MED ORDER — THIAMINE HCL 100 MG PO TABS
100.0000 mg | ORAL_TABLET | Freq: Every day | ORAL | Status: DC
  Administered 2024-08-15: 100 mg
  Filled 2024-08-14 (×2): qty 1

## 2024-08-14 MED ORDER — LOPERAMIDE HCL 2 MG PO CAPS
2.0000 mg | ORAL_CAPSULE | ORAL | Status: DC | PRN
  Administered 2024-08-14 (×2): 2 mg via ORAL
  Filled 2024-08-14 (×2): qty 1

## 2024-08-14 MED ORDER — GUAIFENESIN 100 MG/5ML PO LIQD
15.0000 mL | Freq: Four times a day (QID) | ORAL | Status: DC
  Administered 2024-08-14 – 2024-08-15 (×3): 15 mL via ORAL
  Filled 2024-08-14 (×4): qty 20

## 2024-08-14 MED ORDER — VANCOMYCIN HCL 1250 MG/250ML IV SOLN
1250.0000 mg | INTRAVENOUS | Status: DC
  Administered 2024-08-14: 1250 mg via INTRAVENOUS
  Filled 2024-08-14 (×2): qty 250

## 2024-08-14 MED ORDER — OSMOLITE 1.5 CAL PO LIQD
2000.0000 mL | ORAL | Status: DC
  Administered 2024-08-14: 2000 mL

## 2024-08-14 MED ORDER — FREE WATER
180.0000 mL | Freq: Every day | Status: DC
  Administered 2024-08-14 – 2024-08-15 (×3): 180 mL
  Filled 2024-08-14 (×2): qty 180

## 2024-08-14 MED ORDER — HALOPERIDOL LACTATE 5 MG/ML IJ SOLN: 1.0000 mg | Freq: Four times a day (QID) | INTRAMUSCULAR | Status: DC | PRN

## 2024-08-14 MED ORDER — FREE WATER
200.0000 mL | Freq: Four times a day (QID) | Status: DC
  Administered 2024-08-14: 200 mL

## 2024-08-14 MED ORDER — PROSOURCE TF20 ENFIT COMPATIBL EN LIQD
60.0000 mL | Freq: Every day | ENTERAL | Status: DC
  Administered 2024-08-15: 60 mL
  Filled 2024-08-14: qty 60

## 2024-08-14 MED ORDER — OSMOLITE 1.5 CAL PO LIQD: 1000.0000 mL | ORAL | Status: DC

## 2024-08-14 MED ORDER — OXYCODONE HCL 5 MG/5ML PO SOLN
10.0000 mg | Freq: Four times a day (QID) | ORAL | Status: DC | PRN
  Administered 2024-08-14: 10 mg via ORAL
  Filled 2024-08-14 (×3): qty 10

## 2024-08-14 MED ORDER — MORPHINE SULFATE (PF) 2 MG/ML IV SOLN
2.0000 mg | INTRAVENOUS | Status: DC | PRN
  Administered 2024-08-14 (×4): 2 mg via INTRAVENOUS
  Filled 2024-08-14 (×4): qty 1

## 2024-08-14 MED ORDER — OSMOLITE 1.5 CAL PO LIQD
237.0000 mL | Freq: Every day | ORAL | Status: DC
  Administered 2024-08-14 – 2024-08-15 (×3): 237 mL

## 2024-08-14 MED ORDER — LORAZEPAM 2 MG/ML IJ SOLN
0.5000 mg | INTRAMUSCULAR | Status: DC | PRN
  Administered 2024-08-14 (×3): 0.5 mg via INTRAVENOUS
  Filled 2024-08-14 (×3): qty 1

## 2024-08-14 MED ORDER — SODIUM CHLORIDE 0.9 % IV BOLUS
500.0000 mL | Freq: Once | INTRAVENOUS | Status: AC
  Administered 2024-08-14: 500 mL via INTRAVENOUS

## 2024-08-14 NOTE — Progress Notes (Signed)
 Pharmacy Antibiotic Note  Brad Chen is a 63 y.o. male admitted on 08/13/2024 with cellulitis.  Pharmacy has been consulted for Vancomycin  dosing.  Plan: Pt given Vancomycin  1000 mg once. Vancomycin  1000 mg IV Q 24 hrs. Goal AUC 400-550. Expected AUC: 482.3 SCr used: 1.21, TBW 60.1 kg < IBW 66.1 kg  Follow up culture results to assess for antibiotic optimization. Monitor renal function to assess for any necessary antibiotic dosing changes. Pharmacy will continue to follow and will adjust abx dosing whenever warranted.  Temp (24hrs), Avg:97.7 F (36.5 C), Min:97.6 F (36.4 C), Max:97.7 F (36.5 C)   Recent Labs  Lab 08/13/24 1646  WBC 5.2  CREATININE 1.21  LATICACIDVEN 0.8    Estimated Creatinine Clearance: 52.4 mL/min (by C-G formula based on SCr of 1.21 mg/dL).    No Known Allergies  Antimicrobials this admission: 11/29 Cefepime  >> x 1 dose 11/29 Flagyl  >> x 1 dose 11/29 Vancomycin  >>  11/30 Ceftriaxone >>  Microbiology results: 11/29 BCx: Pending  Thank you for allowing pharmacy to be a part of this patient's care.  Rankin Brad Chen, PharmD, MBA 08/14/2024 12:04 AM

## 2024-08-14 NOTE — ED Notes (Signed)
 Spouse at the bedside. MD Caleen to come consult with family regarding continuous feedings.

## 2024-08-14 NOTE — Assessment & Plan Note (Signed)
 Patient developed watery diarrhea this morning, recently completed the course of antibiotic and hospitalization. No overt leukocytosis but white cell count increased to 9 from 4 yesterday. - Check C. difficile and GI pathogen panel - Continue with IV fluid

## 2024-08-14 NOTE — ED Notes (Signed)
 Dietary notified of the need for Osmolite 1.5 feeding.

## 2024-08-14 NOTE — ED Notes (Addendum)
 Delay in antibiotic admin d/t infiltrated PIV's. IV access team notified and came to bedside
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
## 2024-08-14 NOTE — ED Notes (Signed)
 Supply called by this RN for connecting device for feeding tube; pts tube has no thread or connector for feed tubing.

## 2024-08-14 NOTE — ED Notes (Signed)
 Pt cleaned from bm. Barrier cream applied. Full linen change completed. Fresh blankets provided.

## 2024-08-14 NOTE — ED Notes (Signed)
 Amin, Md notified of the need for a ordered rate of continuous feeding/free water flush. Awaiting further orders.

## 2024-08-14 NOTE — Progress Notes (Signed)
 Pharmacy Antibiotic Note  Brad Chen is a 63 y.o. male admitted on 08/13/2024 with neck cellulitis.  Pharmacy has been consulted for Vancomycin  dosing.  -recently admitted at [facility] for suspected sepsis with pneumonia and completed antibiotic therapy for 5 days and was discharged 08/12/24  Plan: Pt given Vancomycin  1000 mg @1738  on 11/29 and 1000 mg @0633  on 11/30. Scr improved 1.21>>1.07 Will order Vancomycin  1250 mg IV Q 24 hrs. Goal AUC 400-550. Expected AUC: 538 Cmin 11.9 SCr used: 1.07,  use TBW 60.1 kg < IBW 66.1 kg  Follow up culture results to assess for antibiotic optimization. Monitor renal function to assess for any necessary antibiotic dosing changes. Pharmacy will continue to follow and will adjust abx dosing whenever warranted.  Temp (24hrs), Avg:99.3 F (37.4 C), Min:97.6 F (36.4 C), Max:100.6 F (38.1 C)   Recent Labs  Lab 08/13/24 1646 08/14/24 0438  WBC 5.2 9.4  CREATININE 1.21 1.07  LATICACIDVEN 0.8  --     Estimated Creatinine Clearance: 59.3 mL/min (by C-G formula based on SCr of 1.07 mg/dL).    No Known Allergies  Antimicrobials this admission: 11/29 Cefepime  >> x 1 dose 11/29 Flagyl  >> x 1 dose 11/29  Vancomycin  >>  11/30 Ceftriaxone >>  Microbiology results: 11/29 BCx: NG <24 hrs  Thank you for allowing pharmacy to be a part of this patient's care.  Allean Haas PharmD Clinical Pharmacist 08/14/2024

## 2024-08-14 NOTE — Assessment & Plan Note (Signed)
Resolved with IV fluid. -Continue to monitor

## 2024-08-14 NOTE — ED Notes (Signed)
 Patient cleaned from liquid bm. Clean brief and chucks placed along with barrier cream to buttocks and scrotum.

## 2024-08-14 NOTE — Progress Notes (Signed)
 Initial Nutrition Assessment  DOCUMENTATION CODES:   Not applicable  INTERVENTION:   Resume pt's home tube feed regimen of:  Osmolite 1.5- Give one carton 5 times daily via tube. Flush with 90ml of water before and after each feeding.   ProSource TF 20- Give 60ml daily via tube, each supplement provides 80kcal and 20g of protein.   Regimen provides 1855kcal/day, 95g/day protein and 1869ml/day of free water.   Pt at high refeed risk; recommend monitor potassium, magnesium  and phosphorus labs daily until stable  Daily weights   NUTRITION DIAGNOSIS:   Inadequate oral intake related to dysphagia as evidenced by NPO status (pt with chronic G-tube).  GOAL:   Patient will meet greater than or equal to 90% of their needs  MONITOR:   Labs, Weight trends, TF tolerance, Skin, I & O's  REASON FOR ASSESSMENT:   Consult Enteral/tube feeding initiation and management  ASSESSMENT:   63 y/o male with h/o SCC of R oropharynx (stage III, hypopharyngeal) s/p pharyngectomy (2024) and chemoradiation with resulting dysphagia requiring G-tube placement 11/7, CKD, HTN, CAD s/p CABG, HLD, nicotine  use, PVD, TIA, DVT, etoh abuse, GERD, neuropathy and recent admission for sepsis and PNA and G-tube infection who is admitted with diarrhea, hypotension and neck cellulitis.  RD working remotely.  Pt is [facility] by an [facility] at [facility]. Pt receives enteral supplies from [facility] [phone]. Will resume pt's home tube feed regimen and add additional protein modular as pt with increased estimated needs in hospital. Per chart, pt is down 14lbs(10%) since March but appears weight stable since having his G-tube placed on 11/7. Pt is likely at refeed risk; RD will add thiamine . RD will follow up to obtain nutrition related exam and history.    Medications reviewed and include: plavix , vancomycin    Labs reviewed: K 3.7 wnl Hgb 10.2(L), Hct 31.7(L)  NUTRITION - FOCUSED PHYSICAL  EXAM: Unable to perform at this time   Diet Order:   Diet Order             Diet NPO time specified  Diet effective now                  EDUCATION NEEDS:   No education needs have been identified at this time  Skin:  Skin Assessment: Reviewed RN Assessment  Last BM:  11/30- diarrhea  Height:   Ht Readings from Last 1 Encounters:  08/13/24 5' 7 (1.702 m)    Weight:   Wt Readings from Last 1 Encounters:  08/14/24 60.1 kg    Ideal Body Weight:  67.2 kg  BMI:  Body mass index is 20.75 kg/m.  Estimated Nutritional Needs:   Kcal:  1900-2200kcal/day  Protein:  95-110g/day  Fluid:  1.8-2.1L/day  Augustin Shams MS, RD, LDN If unable to be reached, please send secure chat to RD inpatient available from 8:00a-4:00p daily

## 2024-08-14 NOTE — ED Notes (Signed)
 Incontinence brief changed and perineal care performed. Blanchable redness present to bilateral buttocks. MD at bedside to assess. Pt with yellow/greenish colored watery stool.

## 2024-08-14 NOTE — ED Notes (Signed)
 This tech and Lang PEAK cleaned pt and changed pt brief.
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
## 2024-08-14 NOTE — ED Notes (Signed)
 Pt cleaned from liquid bm. Barrier cream applied to scrotum and buttocks. Brief and chucks placed.

## 2024-08-14 NOTE — ED Notes (Signed)
 Pt cleaned from liquid stool. Brief and chucks replaced. Barrier cream applied.

## 2024-08-14 NOTE — ED Notes (Signed)
 Pt rolling around in bed, HR elevating to 130s. Upon checking on patient, pt requesting medication to help him rest, as he feels anxious and restless.

## 2024-08-14 NOTE — ED Notes (Signed)
 Pt resting comfortably at this time. Respirations even an unlabored.

## 2024-08-14 NOTE — ED Notes (Signed)
 Patient had another bowel movement of liquid stool. This RN noted white residue in brief (part of pt tylenol  suppository). Pt adult brief and chux paid changed. Pt continues to be agitated with chills.

## 2024-08-14 NOTE — ED Notes (Signed)
 Patient up to bedside commode for specimen then back to bed. Barrier cream placed to redness on buttocks. Brief and clean chucks placed. Warm blankets provided. Spouse at the bedside.

## 2024-08-14 NOTE — ED Notes (Signed)
 Pt cleaned from liquid stool.Barrier cream reapplied to scrotum and buttocks. Chucks and brief replaced.

## 2024-08-14 NOTE — ED Notes (Signed)
 MD Caleen in with patient and family.

## 2024-08-15 DIAGNOSIS — L03221 Cellulitis of neck: Secondary | ICD-10-CM | POA: Diagnosis not present

## 2024-08-15 DIAGNOSIS — E861 Hypovolemia: Secondary | ICD-10-CM | POA: Diagnosis not present

## 2024-08-15 DIAGNOSIS — G9340 Encephalopathy, unspecified: Secondary | ICD-10-CM | POA: Diagnosis not present

## 2024-08-15 DIAGNOSIS — K219 Gastro-esophageal reflux disease without esophagitis: Secondary | ICD-10-CM

## 2024-08-15 DIAGNOSIS — R197 Diarrhea, unspecified: Secondary | ICD-10-CM

## 2024-08-15 DIAGNOSIS — C140 Malignant neoplasm of pharynx, unspecified: Secondary | ICD-10-CM

## 2024-08-15 LAB — BASIC METABOLIC PANEL WITH GFR
Anion gap: 9 (ref 5–15)
BUN: 15 mg/dL (ref 8–23)
CO2: 25 mmol/L (ref 22–32)
Calcium: 9.1 mg/dL (ref 8.9–10.3)
Chloride: 110 mmol/L (ref 98–111)
Creatinine, Ser: 1.01 mg/dL (ref 0.61–1.24)
GFR, Estimated: >60 mL/min (ref >60)
Glucose, Bld: 110 mg/dL — ABNORMAL HIGH (ref 70–99)
Potassium: 3.4 mmol/L — ABNORMAL LOW (ref 3.5–5.1)
Sodium: 144 mmol/L (ref 135–145)

## 2024-08-15 LAB — CBC
HCT: 32.2 % — ABNORMAL LOW (ref 39.0–52.0)
Hemoglobin: 10.5 g/dL — ABNORMAL LOW (ref 13.0–17.0)
MCH: 31.9 pg (ref 26.0–34.0)
MCHC: 32.6 g/dL (ref 30.0–36.0)
MCV: 97.9 fL (ref 80.0–100.0)
Platelets: 488 K/uL — ABNORMAL HIGH (ref 150–400)
RBC: 3.29 MIL/uL — ABNORMAL LOW (ref 4.22–5.81)
RDW: 13.4 % (ref 11.5–15.5)
WBC: 8.2 K/uL (ref 4.0–10.5)
nRBC: 0 % (ref 0.0–0.2)

## 2024-08-15 LAB — PHOSPHORUS: Phosphorus: 2.5 mg/dL (ref 2.5–4.6)

## 2024-08-15 LAB — MAGNESIUM: Magnesium: 2 mg/dL (ref 1.7–2.4)

## 2024-08-15 MED ORDER — LOPERAMIDE HCL 1 MG/7.5ML PO SUSP: 2.0000 mg | ORAL | Status: DC | PRN

## 2024-08-15 MED ORDER — NALTREXONE HCL 50 MG PO TABS: 50.0000 mg | ORAL_TABLET | Freq: Every day | ORAL | Status: DC

## 2024-08-15 MED ORDER — ONDANSETRON HCL 4 MG PO TABS: 4.0000 mg | ORAL_TABLET | Freq: Four times a day (QID) | ORAL | Status: DC | PRN

## 2024-08-15 MED ORDER — CLOPIDOGREL BISULFATE 75 MG PO TABS: 75.0000 mg | ORAL_TABLET | Freq: Every day | ORAL | Status: DC

## 2024-08-15 MED ORDER — LOPERAMIDE HCL 1 MG/7.5ML PO SUSP: 2.0000 mg | ORAL | 0 refills | Status: AC | PRN

## 2024-08-15 MED ORDER — ONDANSETRON HCL 4 MG/2ML IJ SOLN: 4.0000 mg | Freq: Four times a day (QID) | INTRAMUSCULAR | Status: DC | PRN

## 2024-08-15 MED ORDER — ATORVASTATIN CALCIUM 20 MG PO TABS: 40.0000 mg | ORAL_TABLET | Freq: Every day | ORAL | Status: DC

## 2024-08-15 MED ORDER — OXYCODONE HCL 5 MG/5ML PO SOLN: 10.0000 mg | Freq: Four times a day (QID) | ORAL | Status: DC | PRN

## 2024-08-15 MED ORDER — TRAZODONE HCL 50 MG PO TABS: 25.0000 mg | ORAL_TABLET | Freq: Every evening | ORAL | Status: DC | PRN

## 2024-08-15 MED ORDER — ORAL CARE MOUTH RINSE: 15.0000 mL | OROMUCOSAL | Status: DC | PRN

## 2024-08-15 MED ORDER — ACETAMINOPHEN 325 MG PO TABS: 650.0000 mg | ORAL_TABLET | Freq: Four times a day (QID) | ORAL | Status: DC | PRN

## 2024-08-15 MED ORDER — EZETIMIBE 10 MG PO TABS: 10.0000 mg | ORAL_TABLET | Freq: Every day | ORAL | Status: DC

## 2024-08-15 MED ORDER — GUAIFENESIN 100 MG/5ML PO LIQD: 15.0000 mL | Freq: Four times a day (QID) | ORAL | Status: DC

## 2024-08-15 MED ORDER — ACETAMINOPHEN 650 MG RE SUPP: 650.0000 mg | Freq: Four times a day (QID) | RECTAL | Status: DC | PRN

## 2024-08-15 MED ORDER — GUAIFENESIN 100 MG/5ML PO LIQD: 15.0000 mL | Freq: Four times a day (QID) | ORAL | 0 refills | Status: AC

## 2024-08-15 MED ORDER — POTASSIUM CHLORIDE 20 MEQ PO PACK
20.0000 meq | PACK | Freq: Once | ORAL | Status: AC
  Administered 2024-08-15: 20 meq
  Filled 2024-08-15: qty 1

## 2024-08-15 MED ORDER — MAGNESIUM HYDROXIDE 400 MG/5ML PO SUSP: 30.0000 mL | Freq: Every day | ORAL | Status: DC | PRN

## 2024-08-15 NOTE — Progress Notes (Signed)
 Nutrition Follow-up  DOCUMENTATION CODES:   Severe malnutrition in context of chronic illness  INTERVENTION:   -TF via g-tube:  237 ml Osmolite 1.5 5 times daily  60 ml Prosource TF20 daily  40 ml free water flush before and after each feeding administration  Tube feeding regimen provides 1775 kcal (100% of needs), 75 grams of protein, and 905 ml of H2O.  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  Ongoing  GOAL:   Patient will meet greater than or equal to 90% of their needs  Met with TF  MONITOR:   TF tolerance  REASON FOR ASSESSMENT:   Consult Enteral/tube feeding initiation and management  ASSESSMENT:   63 y/o male with h/o SCC of R oropharynx (stage III, hypopharyngeal) s/p pharyngectomy (2024) and chemoradiation with resulting dysphagia requiring G-tube placement 11/7, CKD, HTN, CAD s/p CABG, HLD, nicotine  use, PVD, TIA, DVT, etoh abuse, GERD, neuropathy and recent admission for sepsis and PNA and G-tube infection who is admitted with diarrhea, hypotension and neck cellulitis.  Reviewed I/O's: +2.3 L x 24 hours and +4.3 L since admission  Spoke with patient at bedside, who was sleeping at time of visit, but arose to voice. Patient was minimally interactive with this RD; did not make eye contact and mostly responded with one word answers. No family present to provide additional history.   Patient confirms that he does not take anything by mouth and uses feeding tube for sole source nutrition. He states that he administers his feeds himself PTA: 237 ml Osmolite 1.5 5 times per day with 40 ml free water flush before and after each feeding administration. Complete  regimen provides 1775 kcals, 75 grams protein, 905 ml water (total water: 1305 ml daily)  Patient states he is tolerating TF well, however, endorses ongoing weight loss since undergoing chemi and radiation treatments (which are scheduled to end this week per his report). Reviewed weight history; patient has experienced a 15.9% weight loss over the past 6 months, which is significant for time frame.   Patient receives TF formula and supplies from [employer] [phone]). Per RD note on 08/04/24, noted recommendations to increase free water flushes to 80 ml before and after each feeding administration and adding Muscle Milk protein powder for additional nutrition.   Per MD, plan to discharge home today.   Medications reviewed and include lovenox  and thiamine .   Labs reviewed: K: 3.4. CBGS: 84.   NUTRITION - FOCUSED PHYSICAL EXAM:  Flowsheet Row Most Recent Value  Orbital Region Mild depletion  Upper Arm Region Moderate depletion  Thoracic and Lumbar Region Mild depletion  Buccal Region Moderate depletion  Temple Region Moderate depletion  Clavicle Bone Region Severe depletion  Clavicle and Acromion Bone Region Severe depletion  Scapular Bone Region Severe depletion  Dorsal Hand Moderate depletion  Patellar Region Severe depletion  Anterior Thigh Region Severe depletion  Posterior Calf Region Severe depletion  Edema (RD Assessment) None  Hair Reviewed  Eyes Reviewed  Mouth Reviewed  Skin Reviewed  Nails Reviewed    Diet Order:   Diet Order             Diet - low sodium heart healthy  Diet NPO time specified  Diet effective now                   EDUCATION NEEDS:   Education needs have been addressed  Skin:  Skin Assessment: Skin Integrity Issues: Skin Integrity Issues:: Other (Comment) Other: redness to buttocks  Last BM:  08/15/24 (type 7)  Height:   Ht Readings from Last 1 Encounters:  08/13/24 5' 7 (1.702 m)    Weight:   Wt  Readings from Last 1 Encounters:  08/15/24 60.3 kg    Ideal Body Weight:  67.2 kg  BMI:  Body mass index is 20.82 kg/m.  Estimated Nutritional Needs:   Kcal:  1900-2200kcal/day  Protein:  95-110g/day  Fluid:  1.8-2.1L/day    Margery ORN, RD, LDN, CDCES Registered Dietitian III Certified Diabetes Care and Education Specialist If unable to reach this RD, please use RD Inpatient group chat on secure chat between hours of 8am-4 pm daily
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
## 2024-08-15 NOTE — Discharge Summary (Signed)
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 DETECTED Final   Norovirus GI/GII NOT DETECTED NOT DETECTED Final   Rotavirus A NOT DETECTED NOT DETECTED Final   Sapovirus (I, II, IV, and V) NOT DETECTED NOT DETECTED Final    Comment: Performed at [facility], [address] [place], [place] [address]    Labs: CBC: Recent Labs  Lab 08/13/24 1646 08/14/24 0438 08/15/24 0430  WBC 5.2 9.4 8.2  HGB 9.7* 10.2* 10.5*  HCT 30.5* 31.7* 32.2*  MCV 102.0* 101.3* 97.9  PLT 372 397 488*   Basic Metabolic Panel: Recent Labs  Lab 08/13/24 1646 08/14/24 0438 08/15/24 0430  NA 139 140 144  K 5.3* 3.7 3.4*  CL 101 105 110  CO2 31 24 25   GLUCOSE 110* 101* 110*  BUN 19 16 15   CREATININE 1.21 1.07 1.01  CALCIUM  8.9 9.1 9.1  MG  --   --  2.0  PHOS   --   --  2.5   Liver Function Tests: Recent Labs  Lab 08/13/24 1646  AST 26  ALT 24  ALKPHOS 61  BILITOT 0.3  PROT 6.4*  ALBUMIN 3.1*   CBG: Recent Labs  Lab 08/13/24 2012  GLUCAP 84    Discharge time spent: greater than 30 minutes.  This record has been created using Conservation officer, historic buildings. Errors have been sought and corrected,but may not always be located. Such creation errors do not reflect on the standard of care.   Signed: Amaryllis Dare, MD [facility] 08/15/2024

## 2024-08-15 NOTE — TOC Initial Note (Signed)
 Transition of Care [facility]) - Initial/Assessment Note    Patient Details  Name: Brad Chen MRN: [id number] Date of Birth: [birth year]  Transition of [facility]) CM/SW Contact:    Shasta DELENA Daring, RN Phone Number: 08/15/2024, 11:30 AM  Clinical Narrative:                 [facility] completed readmission risk prevention screen. Patient lives in single family home with wife. Wife provides transportation to appointments and will provide transpo home. HH services with [facility] will continue. DME in home includes walker, cane, portable suction. [facility] [address] [facility] [place]. Denies difficulty affording medication. Confirmed patient has appt in [facility] today and is planning to attend. No TOC needs identified.  Expected Discharge Plan: Home w Home Health Services Barriers to Discharge: Barriers Resolved   Patient Goals and CMS Choice            Expected Discharge Plan and Services In-house Referral: Clinical Social Work Discharge Planning Services: CM Consult     Expected Discharge Date: 08/15/24                           [facility] Agency: [facility] Date HH Agency Contacted: 08/15/24   Representative spoke with at [facility] Agency: Shaun  Prior Living Arrangements/Services     Patient language and need for interpreter reviewed:: Yes Do you feel safe going back to the place where you live?: Yes      Need for Family Participation in Patient Care: Yes (Comment) Care giver support system in place?: Yes (comment) Current home services: Home OT, Home PT, Home RN Criminal Activity/Legal Involvement Pertinent to Current Situation/Hospitalization: No - Comment as needed  Activities of Daily Living   ADL Screening (condition at time of admission) Independently performs ADLs?: Yes (appropriate for developmental age) Is the patient deaf or have difficulty hearing?: Yes Does the patient have difficulty seeing, even when wearing glasses/contacts?: No Does  the patient have difficulty concentrating, remembering, or making decisions?: Yes  Permission Sought/Granted Permission sought to share information with : Facility Medical Sales Representative, Case Estate Manager/land Agent granted to share information with : Yes, Verbal Permission Granted  Share Information with NAME: Shaun  Permission granted to share info w AGENCY: [facility]        Emotional Assessment Appearance:: Appears stated age Attitude/Demeanor/Rapport: Gracious Affect (typically observed): Stable Orientation: : Oriented to Place, Oriented to Self, Oriented to  Time, Oriented to Situation Alcohol / Substance Use: Not Applicable    Admission diagnosis:  Hypotension, unspecified [I95.9] Dehydration [E86.0] Cellulitis of neck [L03.221] 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] Altered mental status, unspecified altered mental status type [R41.82] Sepsis, due to unspecified organism, unspecified whether acute organ dysfunction present [facility]) [A41.9] Patient Active Problem List   Diagnosis Date Noted   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   Diarrhea 08/14/2024   Hyperkalemia 08/14/2024   Hypotension, unspecified 08/13/2024   Dyslipidemia 08/13/2024   Essential hypertension 08/13/2024   GERD without esophagitis 08/13/2024   Peripheral neuropathy 08/13/2024   Throat cancer (HCC) 08/13/2024   Altered mental status 08/13/2024   Cellulitis of neck 08/13/2024   Dehydration 08/13/2024   Acute encephalopathy 08/13/2024   Atherosclerosis of native arteries of extremity with intermittent claudication 04/12/2024   Alcohol withdrawal syndrome without complication (HCC) 11/30/2023   Acute, mixed level of activity, alcohol withdrawal delirium (HCC) 11/29/2023   Hypertensive urgency 11/29/2023   S/P coronary artery bypass graft x 3  07/06/2023   Carotid stenosis, right 05/07/2023   Carotid stenosis 04/15/2023   Subclavian artery stenosis, left 04/15/2023   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 [place] [address] Phone: [phone] Fax: [phone]  [facility] [address] [place] [address] Phone: [phone] Fax: [phone]     Social Drivers of Health (SDOH) Social History: SDOH Screenings   Food Insecurity: No Food Insecurity (08/14/2024)  Housing: Unknown (08/14/2024)  Transportation Needs: No Transportation Needs (08/14/2024)  Utilities: Not At Risk (08/14/2024)  Depression (PHQ2-9): Low Risk  (08/05/2023)  Financial Resource Strain: Low Risk (07/25/2024)   Received from [facility]  Social Connections: Moderately Isolated (08/14/2024)  Tobacco Use: Medium Risk (08/13/2024)  Health Literacy: Low Risk (06/06/2024)   Received from [facility]   SDOH Interventions:     Readmission Risk Interventions    08/15/2024   11:25 AM  Readmission Risk Prevention Plan  Transportation Screening Complete  PCP or Specialist Appt within 3-5 Days Complete  HRI or Home Care Consult Complete  Social Work Consult for Recovery Care Planning/Counseling Complete  Palliative Care Screening Not Applicable  Medication Review Oceanographer) Complete

## 2024-08-15 NOTE — TOC CM/SW Note (Signed)
 Transition of Care [facility]) - Inpatient Brief Assessment   Patient Details  Name: Brad Chen MRN: [id number] Date of Birth: November 07, 1960  [facility]) CM/SW Contact:    Shasta DELENA Daring, RN Phone Number: 08/15/2024, 11:10 AM   Clinical Narrative: RNCM Confirmed patient has active home health services with [facility] including PT, OT and nursing. No TOC needs identified.  RNCM signing off.   Transition of Care Asessment: Insurance and Status: Insurance coverage has been reviewed Patient has primary care physician: Yes       Social Drivers of Health Review: SDOH reviewed no interventions necessary   Transition of care needs: no transition of care needs at this time

## 2024-08-15 NOTE — Plan of Care (Signed)

## 2024-08-18 ENCOUNTER — Other Ambulatory Visit (HOSPITAL_COMMUNITY): Payer: Self-pay

## 2024-08-19 LAB — CULTURE, BLOOD (ROUTINE X 2)
Culture: NO GROWTH
Culture: NO GROWTH

## 2024-09-06 ENCOUNTER — Other Ambulatory Visit (INDEPENDENT_AMBULATORY_CARE_PROVIDER_SITE_OTHER): Payer: Self-pay | Admitting: Nurse Practitioner

## 2024-09-06 DIAGNOSIS — I6521 Occlusion and stenosis of right carotid artery: Secondary | ICD-10-CM

## 2024-09-06 DIAGNOSIS — I771 Stricture of artery: Secondary | ICD-10-CM

## 2024-09-06 DIAGNOSIS — I10 Essential (primary) hypertension: Secondary | ICD-10-CM

## 2024-09-06 DIAGNOSIS — I70213 Atherosclerosis of native arteries of extremities with intermittent claudication, bilateral legs: Secondary | ICD-10-CM

## 2024-09-06 DIAGNOSIS — E785 Hyperlipidemia, unspecified: Secondary | ICD-10-CM

## 2024-09-11 ENCOUNTER — Emergency Department

## 2024-09-11 ENCOUNTER — Emergency Department
Admission: EM | Admit: 2024-09-11 | Discharge: 2024-09-11 | Disposition: A | Discharge status: Home / Self Care | Attending: Emergency Medicine | Admitting: Emergency Medicine

## 2024-09-11 ENCOUNTER — Other Ambulatory Visit: Payer: Self-pay

## 2024-09-11 DIAGNOSIS — I10 Essential (primary) hypertension: Secondary | ICD-10-CM | POA: Insufficient documentation

## 2024-09-11 DIAGNOSIS — M7989 Other specified soft tissue disorders: Secondary | ICD-10-CM | POA: Diagnosis present

## 2024-09-11 DIAGNOSIS — Z7901 Long term (current) use of anticoagulants: Secondary | ICD-10-CM | POA: Insufficient documentation

## 2024-09-11 DIAGNOSIS — Z8521 Personal history of malignant neoplasm of larynx: Secondary | ICD-10-CM | POA: Insufficient documentation

## 2024-09-11 DIAGNOSIS — L03116 Cellulitis of left lower limb: Secondary | ICD-10-CM | POA: Diagnosis not present

## 2024-09-11 LAB — CBC WITH DIFFERENTIAL/PLATELET
Abs Immature Granulocytes: 0.03 K/uL (ref 0.00–0.07)
Basophils Absolute: 0 K/uL (ref 0.0–0.1)
Basophils Relative: 0 %
Eosinophils Absolute: 0.1 K/uL (ref 0.0–0.5)
Eosinophils Relative: 1 %
HCT: 31.6 % — ABNORMAL LOW (ref 39.0–52.0)
Hemoglobin: 10.4 g/dL — ABNORMAL LOW (ref 13.0–17.0)
Immature Granulocytes: 0 %
Lymphocytes Relative: 6 %
Lymphs Abs: 0.5 K/uL — ABNORMAL LOW (ref 0.7–4.0)
MCH: 33.5 pg (ref 26.0–34.0)
MCHC: 32.9 g/dL (ref 30.0–36.0)
MCV: 101.9 fL — ABNORMAL HIGH (ref 80.0–100.0)
Monocytes Absolute: 1 K/uL (ref 0.1–1.0)
Monocytes Relative: 11 %
Neutro Abs: 7.3 K/uL (ref 1.7–7.7)
Neutrophils Relative %: 82 %
Platelets: 287 K/uL (ref 150–400)
RBC: 3.1 MIL/uL — ABNORMAL LOW (ref 4.22–5.81)
RDW: 15.5 % (ref 11.5–15.5)
WBC: 9 K/uL (ref 4.0–10.5)
nRBC: 0 % (ref 0.0–0.2)

## 2024-09-11 LAB — BASIC METABOLIC PANEL WITH GFR
Anion gap: 10 (ref 5–15)
BUN: 17 mg/dL (ref 8–23)
CO2: 31 mmol/L (ref 22–32)
Calcium: 9.3 mg/dL (ref 8.9–10.3)
Chloride: 100 mmol/L (ref 98–111)
Creatinine, Ser: 0.95 mg/dL (ref 0.61–1.24)
GFR, Estimated: >60 mL/min
Glucose, Bld: 113 mg/dL — ABNORMAL HIGH (ref 70–99)
Potassium: 4 mmol/L (ref 3.5–5.1)
Sodium: 141 mmol/L (ref 135–145)

## 2024-09-11 MED ORDER — CEPHALEXIN 500 MG PO CAPS: 500.0000 mg | ORAL_CAPSULE | Freq: Three times a day (TID) | ORAL | 0 refills | Status: AC

## 2024-09-11 NOTE — Discharge Instructions (Signed)
 Take a probiotic while taking antibiotics to prevent diarrha

## 2024-09-11 NOTE — ED Triage Notes (Signed)
 Pt c/o left leg swelling. Just noticed it this morning. No c/o pain. Hx of throat Ca.

## 2024-09-11 NOTE — ED Provider Notes (Signed)
 "  [facility] Provider Note    Event Date/Time   First MD Initiated Contact with Patient 09/11/24 1039     (approximate)   History   Leg Swelling   HPI  Brad Chen is a 63 y.o. male history of hypertension, hyperlipidemia, throat cancer, presents emergency department with swelling to the left lower leg.  Patient is currently on Plavix .  His wife stated she noticed a large amount of swelling this morning.  Does have heart disease but the swelling is only affecting 1 leg.  Denies chest pain/shortness of breath.     Physical Exam   Triage Vital Signs: ED Triage Vitals  Encounter Vitals Group     BP 09/11/24 0944 108/71     Girls Systolic BP Percentile --      Girls Diastolic BP Percentile --      Boys Systolic BP Percentile --      Boys Diastolic BP Percentile --      Pulse Rate 09/11/24 0944 76     Resp 09/11/24 0944 17     Temp 09/11/24 0944 97.9 F (36.6 C)     Temp Source 09/11/24 0944 Oral     SpO2 09/11/24 0944 94 %     Weight 09/11/24 0945 123 lb 9.6 oz (56.1 kg)     Height 09/11/24 0945 5' 7 (1.702 m)     Head Circumference --      Peak Flow --      Pain Score 09/11/24 0945 0     Pain Loc --      Pain Education --      Exclude from Growth Chart --     Most recent vital signs: Vitals:   09/11/24 0944  BP: 108/71  Pulse: 76  Resp: 17  Temp: 97.9 F (36.6 C)  SpO2: 94%     General: Awake, no distress.   CV:  Good peripheral perfusion. regular rate and  rhythm Resp:  Normal effort. Lungs cta Abd:  No distention.   Other:  Left lower extremity with swelling, much larger than the right, nontender, some redness with noted, pulses intact distally, foot is warm   ED Results / Procedures / Treatments   Labs (all labs ordered are listed, but only abnormal results are displayed) Labs Reviewed  CBC WITH DIFFERENTIAL/PLATELET - Abnormal; Notable for the following components:      Result Value   RBC 3.10 (*)    Hemoglobin  10.4 (*)    HCT 31.6 (*)    MCV 101.9 (*)    Lymphs Abs 0.5 (*)    All other components within normal limits  BASIC METABOLIC PANEL WITH GFR - Abnormal; Notable for the following components:   Glucose, Bld 113 (*)    All other components within normal limits     EKG     RADIOLOGY Ultrasound left lower extremity    PROCEDURES:   Procedures  Critical Care:  no Chief Complaint  Patient presents with   Leg Swelling      MEDICATIONS ORDERED IN ED: Medications - No data to display   IMPRESSION / MDM / ASSESSMENT AND PLAN / ED COURSE  I reviewed the triage vital signs and the nursing notes.                              Differential diagnosis includes, but is not limited to, DVT, cellulitis, dependent edema, CHF  Patient's presentation is most consistent with acute illness / injury with system symptoms.   With the patient's history of throat cancer, I do have concerns for DVT.  Feel that CHF is less likely as he has no chest pains, shortness of breath, or bilateral swelling.  Basic labs ordered, results are reassuring  Ultrasound left lower extremity for DVT, independent review interpretation as being negative for any acute abnormality  I did explain these findings to the patient and his wife.  I did caution him that if the leg continues to be swollen within a week they should have a repeat ultrasound to determine if DVT.  With only one-sided leg swelling do not pose CHF.  Patient's had no chest pain or shortness of breath as stated above.  Will go ahead and treat him for cellulitis.  Keflex  p.o.  Follow-up with his regular doctor in 3 days.  He is in agreement treatment plan.  Discharged stable condition.      FINAL CLINICAL IMPRESSION(S) / ED DIAGNOSES   Final diagnoses:  Cellulitis of left leg     Rx / DC Orders   ED Discharge Orders          Ordered    cephALEXin  (KEFLEX ) 500 MG capsule  3 times daily        09/11/24 1259             Note:   This document was prepared using Dragon voice recognition software and may include unintentional dictation errors.    Gasper Devere ORN, PA-C 09/11/24 1301    Claudene Rover, MD 09/11/24 1322  "

## 2024-09-14 ENCOUNTER — Ambulatory Visit (INDEPENDENT_AMBULATORY_CARE_PROVIDER_SITE_OTHER): Admitting: Nurse Practitioner

## 2024-09-14 ENCOUNTER — Ambulatory Visit (INDEPENDENT_AMBULATORY_CARE_PROVIDER_SITE_OTHER)

## 2024-09-14 ENCOUNTER — Encounter (INDEPENDENT_AMBULATORY_CARE_PROVIDER_SITE_OTHER): Payer: Self-pay | Admitting: Nurse Practitioner

## 2024-09-14 VITALS — BP 150/80 | HR 76 | Resp 17 | Ht 67.0 in | Wt 127.4 lb

## 2024-09-14 DIAGNOSIS — I70213 Atherosclerosis of native arteries of extremities with intermittent claudication, bilateral legs: Secondary | ICD-10-CM | POA: Diagnosis not present

## 2024-09-14 DIAGNOSIS — I771 Stricture of artery: Secondary | ICD-10-CM

## 2024-09-14 DIAGNOSIS — I6521 Occlusion and stenosis of right carotid artery: Secondary | ICD-10-CM

## 2024-09-14 DIAGNOSIS — I739 Peripheral vascular disease, unspecified: Secondary | ICD-10-CM

## 2024-09-18 ENCOUNTER — Encounter (INDEPENDENT_AMBULATORY_CARE_PROVIDER_SITE_OTHER): Payer: Self-pay | Admitting: Nurse Practitioner

## 2024-09-18 NOTE — Progress Notes (Signed)
 "  Subjective:    Patient ID: Brad Chen, male    DOB: 01/11/1961, 64 y.o.   MRN: [id number] Chief Complaint  Patient presents with   Follow-up     Carotid + illiac     HPI  Discussed the use of AI scribe software for clinical note transcription with the patient, who gave verbal consent to proceed.  History of Present Illness Brad Chen is a 64 year old male with bilateral carotid artery stenosis status post right carotid stent placement and peripheral artery disease who presents for vascular surgery follow-up.  He presents for follow-up after right carotid stent placement in August 2024. He has not developed new neurological symptoms since the procedure. Surveillance imaging has been performed to monitor carotid artery status, and he is aware of his history of carotid artery narrowing and stent placement.  He is also being evaluated for lower extremity peripheral artery disease after imaging in November 2025 demonstrated arterial calcifications and stenosis. He denies lower extremity pain, heaviness, fatigue, or weakness with ambulation. He has not experienced claudication, rest pain, nocturnal leg pain, or acute limb symptoms such as sudden pain, coldness, or numbness.  Earlier this month, he developed left lower extremity swelling. Duplex ultrasound was negative for deep vein thrombosis. He was treated with antibiotics for presumed cellulitis, and the swelling has resolved without recurrence.    Results Diagnostic Left lower extremity venous ultrasound (08/2024): No deep venous thrombosis Carotid duplex ultrasound (09/14/2024): Right carotid stent widely patent; 40-59% stenosis distal to stent; left carotid artery 1-39% stenosis, improved from prior 40-59% Aortoiliac duplex ultrasound (09/14/2024): Left common iliac artery stenosis just above 50%; aorta normal caliber; right iliac artery without significant stenosis   Review of Systems  Cardiovascular:  Negative for leg  swelling.  Skin:  Negative for color change and rash.  All other systems reviewed and are negative.      Objective:   Physical Exam Vitals reviewed.  HENT:     Head: Normocephalic.  Cardiovascular:     Rate and Rhythm: Normal rate.     Pulses: Normal pulses.  Pulmonary:     Effort: Pulmonary effort is normal.  Skin:    General: Skin is warm and dry.  Neurological:     Mental Status: He is alert and oriented to person, place, and time.  Psychiatric:        Mood and Affect: Mood normal.        Behavior: Behavior normal.        Thought Content: Thought content normal.        Judgment: Judgment normal.     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   a.) s/p partial pharyngectomy, small FOM lesion resection, biodesign placement 11/25/2022 --> pathology (+) for 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); p16 and HPV (-)   Status post carpal tunnel release of both wrists    Subclavian artery stenosis, left 01/2023   a.) s/p PTA and stenting 02/13/2023--> tandem proximal LEFT subclavian and LEFT axillary artery stents   TIA (transient ischemic attack) 2018   Torus mandibularis    a.) s/p excision 11/25/2022    Social History   Socioeconomic History   Marital status: Married    Spouse name: Not on file   Number of children: Not on file   Years of education: Not on file   Highest education level: Not on file  Occupational History   Not on file  Tobacco Use   Smoking status: Former    Average packs/day: 1 pack/day for 33.0 years (33.0 ttl pk-yrs)    Types: Cigarettes    Start date: 33   Smokeless tobacco: Never  Vaping Use   Vaping status: Never Used  Substance and Sexual Activity   Alcohol use: Not Currently    Comment: stopped 12-2022 when dx with head/neck cancer   Drug use: Never   Sexual activity: Not on file  Other Topics Concern   Not on file  Social History Narrative   Not on file   Social Drivers of Health   Tobacco Use: Medium Risk (09/18/2024)   Patient History    Smoking Tobacco Use: Former    Smokeless Tobacco Use: Never    Passive Exposure: Not on Actuary Strain: Low Risk (08/31/2024)   Received from [facility]   Overall Financial Resource Strain (CARDIA)    How hard is it for you to pay for the very basics like food, housing, medical care, and heating?: Not hard at all  Food Insecurity: No Food Insecurity (08/31/2024)    Received from [facility]   Epic    Within the past 12 months, you worried that your food would run out before you got the money to buy more.: Never true    Within the past 12 months, the food you bought just didn't last and you didn't have money to get more.: Never true  Transportation Needs: No Transportation Needs (08/31/2024)   Received from [facility]   PRAPARE - Transportation    Lack of Transportation (Medical): No    Lack of Transportation (Non-Medical): No  Physical Activity: Not on file  Stress: Not on file  Social Connections: Moderately Isolated (08/14/2024)   Social Connection and Isolation  Panel    Frequency of Communication with Friends and Family: More than three times a week    Frequency of Social Gatherings with Friends and Family: More than three times a week    Attends Religious Services: Never    Database Administrator or Organizations: No    Attends Banker Meetings: Never    Marital Status: Married  Catering Manager Violence: Not At Risk (08/14/2024)   Epic    Fear of Current or Ex-Partner: No    Emotionally Abused: No    Physically Abused: No    Sexually Abused: No  Depression (PHQ2-9): Low Risk (08/05/2023)   Depression (PHQ2-9)    PHQ-2 Score: 1  Alcohol Screen: Not on file  Housing: Low Risk  (09/07/2024)   Received from [facility]   Epic    In the last 12 months, was there a time when you were not able to pay the mortgage or rent on time?: No    In the past 12 months, how many times have you moved where you were living?: 0    At any time in the past 12 months, were you homeless or living in a shelter (including now)?: No  Utilities: Low Risk (08/31/2024)   Received from [facility]   Utilities    Within the past 12 months, have you been unable to get utilities(heat, electricity) when it was really needed?: No  Health Literacy: Low Risk (06/06/2024)   Received from [facility] Liter[facility]    How  often do you need to have someone help you when you read instructions, pamphlets, or other written material from your doctor or pharmacy?: Never    Past Surgical History:  Procedure Laterality Date   ANTERIOR CERVICAL DECOMP/DISCECTOMY FUSION N/A 2003   Procedure: ANTERIOR CERVICAL DECOMP/DISCECTOMY FUSION (C5-C6)   CARPAL TUNNEL RELEASE Bilateral    ENDARTERECTOMY Right 05/07/2023   Procedure: ENDARTERECTOMY CAROTID;  Surgeon: Marea Selinda RAMAN, MD;  Location: [facility] ORS;  Service: Vascular;  Laterality: Right;   LEFT HEART CATH AND CORONARY ANGIOGRAPHY Left 06/18/2023   Procedure: LEFT HEART CATH AND CORONARY ANGIOGRAPHY;  Surgeon: Florencio Cara BIRCH, MD;  Location: [facility] INVASIVE CV [facility];  Service: Cardiovascular;  Laterality: Left;   PHARYNGECTOMY  11/25/2022   Procedure(s): PR PARTIAL REMOVAL OF PHARYNX  PR REMOVAL NODES, NECK,CERV MOD RAD  PR EXCIS MOUTH MUCOSA/SUB,NO REPAIR  LTD PHARYNGECTOMY  CERVICAL LYMPHADENECTOMY (MODIFIED RADICAL NECK DISSECTION)  EXCISION OF LESION OF MUCOSA AND SUBMUCOSA, VESTIBULE OF MOUTH; WITHOUT REPAIR   SHOULDER ARTHROSCOPY WITH SUBACROMIAL DECOMPRESSION AND OPEN ROTATOR C Left 07/22/2022   Procedure: Left shoulder arthroscopic subscapularis repair, mini open rotator cuff repair vs reconstruction with allograft, subacromial decompression, distal clavicle excision, and biceps tenodesis;  Surgeon: Tobie Priest, MD;  Location: [facility] OR[facility];  Service: Orthopedics;  Laterality: Left;   UPPER EXTREMITY ANGIOGRAPHY Left 02/13/2023   Procedure: Upper Extremity Angiography;  Surgeon: Marea Selinda RAMAN, MD;  Location: [facility] INV[facility];  Service: Cardiovascular;  Laterality: Left;    Family History  Problem Relation Age of Onset   Hypertension Mother     Allergies[1]     Latest Ref Rng & Units 09/11/2024    9:48 AM 08/15/2024    4:30 AM 08/14/2024    4:38 AM  CBC  WBC 4.0 - 10.5 K/uL 9.0  8.2  9.4   Hemoglobin 13.0 - 17.0 g/dL 89.5  89.4  89.7   Hematocrit 39.0 - 52.0 %  31.6  32.2  31.7   Platelets 150 - 400 K/uL 287  488  397       CMP     Component Value Date/Time   NA 141 09/11/2024 0948   K 4.0 09/11/2024 0948   CL 100 09/11/2024 0948   CO2 31 09/11/2024 0948   GLUCOSE 113 (H) 09/11/2024 0948   BUN 17 09/11/2024 0948   CREATININE 0.95 09/11/2024 0948   CALCIUM  9.3 09/11/2024 0948   PROT 6.4 (L) 08/13/2024 1646   ALBUMIN 3.1 (L) 08/13/2024 1646   AST 26 08/13/2024 1646   ALT 24 08/13/2024 1646   ALKPHOS 61 08/13/2024 1646   BILITOT 0.3 08/13/2024 1646   GFRNONAA >60 09/11/2024 0948     No results found.     Assessment & Plan:   1. Stenosis of right carotid artery (Primary) Right carotid artery stenosis status post stent placement Post-stent placement with patent stent and new 40-59% distal stenosis, not hemodynamically significant. Asymptomatic with improved left carotid stenosis. - Scheduled follow-up carotid imaging in 6 months to monitor stenosis progression. - VAS US  CAROTID; Future  2. PAD (peripheral artery disease) Peripheral artery disease with left common iliac artery stenosis >50% stenosis of left common iliac artery, asymptomatic. Previous leg swelling resolved, no intervention needed. - Educated on claudication symptoms, including urgent care indications. - Advised increased walking and physical activity to promote collateral circulation and slow disease progression. - Scheduled follow-up vascular imaging in 6 months to reassess lower extremity arterial disease. - VAS US  ABI WITH/WO TBI; Future   Assessment and Plan Assessment & Plan        Medications Ordered Prior to Encounter[2]  There are no Patient Instructions on file for this visit. Return in about 6 months (around 03/14/2025) for with carotid and abi; see JD/FB.   Stashia Sia E Conya Ellinwood, NP      [1] No Known Allergies [2]  Current Outpatient Medications on File Prior to Visit  Medication Sig Dispense Refill   alum & mag hydroxide-simeth (ANTACID  REGULAR STRENGTH) 200-200-20 MG/5ML suspension Mix equal parts diphenhydramine , lidocaine , and liquid antacid. Swish 5 to 10mLs in mouth for 60 seconds, every 4 to 6 hours as needed, then swallow or spit mixture out (as directed by prescriber). Shake well before using. Refrigerate after mixing and discard after 14 days.     atorvastatin  (LIPITOR) 40 MG tablet Take 40 mg by mouth daily.     cephALEXin  (KEFLEX ) 500 MG capsule Take 1 capsule (500 mg total) by mouth 3 (three) times daily for 7 days. 21 capsule 0   clopidogrel  (PLAVIX ) 75 MG tablet Take 75 mg by mouth daily.     ezetimibe  (ZETIA ) 10 MG tablet Take 10 mg by mouth daily.     Ferrous Sulfate (IRON PO) 15 mLs by Enteral route daily.     folic acid  (FOLVITE ) 400 MCG tablet 400 mcg by Enteral route daily.     Multiple Vitamin (MULTIVITAMIN) LIQD Take 15 mLs by mouth daily.     nicotine  (NICODERM CQ  - DOSED IN MG/24 HOURS) 14 mg/24hr patch Place 1 patch onto the skin daily. Remove old patch before applying new one.     ondansetron  (ZOFRAN ) 8 MG tablet 8 mg by Enteral route every 8 (eight) hours as needed for vomiting.     oxyCODONE  (ROXICODONE ) 5 MG/5ML solution Take 10-15 mg by mouth every 4 (four) hours as needed for moderate pain (pain score 4-6).     [Paused] sennosides (SENOKOT) 8.8 MG/5ML  syrup Take 8.8 mg by mouth 2 (two) times daily.     diphenhydrAMINE  (BENADRYL ) 12.5 MG/5ML liquid Mix equal parts diphenhydramine , lidocaine , and liquid antacid. Swish 5 to 10mLs in mouth for 60 seconds, every 4 to 6 hours as needed, then swallow or spit mixture out (as directed by prescriber). Shake well before using. Refrigerate after mixing and discard after 14 days. (Patient not taking: Reported on 09/14/2024)     gabapentin (NEURONTIN) 250 MG/5ML solution Start with 300 mg (6 mL) by mouth at bedtime. Can increase to 300 mg (6 mL) twice a day if needed. (Patient not taking: Reported on 09/14/2024)     guaiFENesin  (ROBITUSSIN) 100 MG/5ML liquid Place 15  mLs into feeding tube 4 (four) times daily. (Patient not taking: Reported on 09/14/2024) 120 mL 0   lidocaine  (XYLOCAINE ) 2 % solution Mix equal parts diphenhydramine , lidocaine , and liquid antacid. Swish 5 to 10mLs in mouth for 60 seconds, every 4 to 6 hours as needed, then swallow or spit mixture out (as directed by prescriber). Shake well before using. Refrigerate after mixing and discard after 14 days. (Patient not taking: Reported on 09/14/2024)     loperamide  HCl (IMODIUM ) 1 MG/7.5ML suspension Place 15 mLs (2 mg total) into feeding tube as needed for diarrhea or loose stools. (Patient not taking: Reported on 09/14/2024) 120 mL 0   mupirocin ointment (BACTROBAN) 2 % Apply 1 Application topically 3 (three) times daily. (Patient not taking: Reported on 09/14/2024)     pantoprazole  (PROTONIX ) 2 mg/mL suspension Place 40 mg into feeding tube 2 (two) times daily. (Patient not taking: Reported on 09/14/2024)     prochlorperazine (COMPAZINE) 10 MG tablet 10 mg by Enteral route every 6 (six) hours as needed for nausea or vomiting. (Patient not taking: Reported on 09/14/2024)     No current facility-administered medications on file prior to visit.   "

## 2025-03-14 ENCOUNTER — Encounter (INDEPENDENT_AMBULATORY_CARE_PROVIDER_SITE_OTHER)

## 2025-03-14 ENCOUNTER — Ambulatory Visit (INDEPENDENT_AMBULATORY_CARE_PROVIDER_SITE_OTHER): Admitting: Nurse Practitioner
